# Patient Record
Sex: Female | Born: 1950
Health system: Southern US, Community
[De-identification: ages and names within clinical notes are randomized; demographics above are authoritative.]

## PROBLEM LIST (undated history)

## (undated) DIAGNOSIS — R911 Solitary pulmonary nodule: Secondary | ICD-10-CM

## (undated) DIAGNOSIS — M1990 Unspecified osteoarthritis, unspecified site: Secondary | ICD-10-CM

## (undated) DIAGNOSIS — L719 Rosacea, unspecified: Secondary | ICD-10-CM

## (undated) DIAGNOSIS — R112 Nausea with vomiting, unspecified: Secondary | ICD-10-CM

## (undated) DIAGNOSIS — H509 Unspecified strabismus: Secondary | ICD-10-CM

## (undated) DIAGNOSIS — M722 Plantar fascial fibromatosis: Secondary | ICD-10-CM

## (undated) DIAGNOSIS — E079 Disorder of thyroid, unspecified: Secondary | ICD-10-CM

## (undated) DIAGNOSIS — T8859XA Other complications of anesthesia, initial encounter: Secondary | ICD-10-CM

## (undated) DIAGNOSIS — E039 Hypothyroidism, unspecified: Secondary | ICD-10-CM

## (undated) DIAGNOSIS — Z9889 Other specified postprocedural states: Secondary | ICD-10-CM

## (undated) DIAGNOSIS — R32 Unspecified urinary incontinence: Secondary | ICD-10-CM

## (undated) DIAGNOSIS — K449 Diaphragmatic hernia without obstruction or gangrene: Secondary | ICD-10-CM

## (undated) DIAGNOSIS — T4145XA Adverse effect of unspecified anesthetic, initial encounter: Secondary | ICD-10-CM

## (undated) HISTORY — DX: Diaphragmatic hernia without obstruction or gangrene: K44.9

## (undated) HISTORY — DX: Unspecified strabismus: H50.9

## (undated) HISTORY — PX: KNEE SURGERY: SHX244

## (undated) HISTORY — PX: ESOPHAGEAL DILATION: SHX303

## (undated) HISTORY — DX: Rosacea, unspecified: L71.9

## (undated) HISTORY — DX: Solitary pulmonary nodule: R91.1

## (undated) HISTORY — DX: Unspecified urinary incontinence: R32

## (undated) HISTORY — DX: Disorder of thyroid, unspecified: E07.9

## (undated) HISTORY — DX: Plantar fascial fibromatosis: M72.2

---

## 1972-08-26 HISTORY — PX: TONSILLECTOMY AND ADENOIDECTOMY: SUR1326

## 1993-08-26 HISTORY — PX: BREAST BIOPSY: SHX20

## 1997-11-10 ENCOUNTER — Other Ambulatory Visit: Admission: RE | Admit: 1997-11-10 | Discharge: 1997-11-10 | Payer: Self-pay | Admitting: Obstetrics and Gynecology

## 1999-08-27 HISTORY — PX: OTHER SURGICAL HISTORY: SHX169

## 2000-10-28 ENCOUNTER — Other Ambulatory Visit: Admission: RE | Admit: 2000-10-28 | Discharge: 2000-10-28 | Payer: Self-pay | Admitting: Obstetrics and Gynecology

## 2001-10-28 ENCOUNTER — Other Ambulatory Visit: Admission: RE | Admit: 2001-10-28 | Discharge: 2001-10-28 | Payer: Self-pay | Admitting: Obstetrics and Gynecology

## 2002-02-16 ENCOUNTER — Encounter (INDEPENDENT_AMBULATORY_CARE_PROVIDER_SITE_OTHER): Payer: Self-pay | Admitting: *Deleted

## 2002-02-16 ENCOUNTER — Ambulatory Visit (HOSPITAL_COMMUNITY): Admission: RE | Admit: 2002-02-16 | Discharge: 2002-02-16 | Payer: Self-pay | Admitting: Gastroenterology

## 2002-05-03 ENCOUNTER — Encounter (INDEPENDENT_AMBULATORY_CARE_PROVIDER_SITE_OTHER): Payer: Self-pay

## 2002-05-03 HISTORY — PX: ABDOMINAL HYSTERECTOMY: SHX81

## 2002-05-03 HISTORY — PX: VAGINAL HYSTERECTOMY: SUR661

## 2002-05-04 ENCOUNTER — Inpatient Hospital Stay (HOSPITAL_COMMUNITY): Admission: RE | Admit: 2002-05-04 | Discharge: 2002-05-05 | Payer: Self-pay | Admitting: Obstetrics and Gynecology

## 2005-08-26 HISTORY — PX: CYSTOCELE REPAIR: SHX163

## 2006-03-17 ENCOUNTER — Other Ambulatory Visit: Admission: RE | Admit: 2006-03-17 | Discharge: 2006-03-17 | Payer: Self-pay | Admitting: Obstetrics and Gynecology

## 2006-05-20 ENCOUNTER — Ambulatory Visit (HOSPITAL_COMMUNITY): Admission: RE | Admit: 2006-05-20 | Discharge: 2006-05-21 | Payer: Self-pay | Admitting: Urology

## 2007-08-27 HISTORY — PX: VEIN LIGATION AND STRIPPING: SHX2653

## 2007-11-30 ENCOUNTER — Other Ambulatory Visit: Admission: RE | Admit: 2007-11-30 | Discharge: 2007-11-30 | Payer: Self-pay | Admitting: Obstetrics and Gynecology

## 2008-04-13 ENCOUNTER — Ambulatory Visit: Payer: Self-pay | Admitting: Vascular Surgery

## 2008-07-08 ENCOUNTER — Ambulatory Visit: Payer: Self-pay | Admitting: Vascular Surgery

## 2008-08-26 HISTORY — PX: ENTEROCELE REPAIR: SHX623

## 2008-09-21 ENCOUNTER — Ambulatory Visit: Payer: Self-pay | Admitting: Vascular Surgery

## 2008-09-28 ENCOUNTER — Ambulatory Visit: Payer: Self-pay | Admitting: Vascular Surgery

## 2008-11-11 ENCOUNTER — Ambulatory Visit: Payer: Self-pay | Admitting: Vascular Surgery

## 2008-12-07 ENCOUNTER — Other Ambulatory Visit: Admission: RE | Admit: 2008-12-07 | Discharge: 2008-12-07 | Payer: Self-pay | Admitting: Obstetrics and Gynecology

## 2010-08-26 HISTORY — PX: BACK SURGERY: SHX140

## 2011-01-08 ENCOUNTER — Other Ambulatory Visit: Payer: Self-pay | Admitting: Physical Medicine and Rehabilitation

## 2011-01-08 DIAGNOSIS — M545 Low back pain, unspecified: Secondary | ICD-10-CM

## 2011-01-08 NOTE — Assessment & Plan Note (Signed)
OFFICE VISIT   BURMA, KETCHER  DOB:  Sep 26, 1950                                       07/08/2008  EAVWU#:[id number]   The patient presents today for continued follow-up of her right leg  venous varicosities.  She has been seen by me initially in August.  She  has been very compliant with her thigh-high graduated compression  garments and has had no relief.  She reports these actually make her  legs feel worse with constriction at the top of her leg.  She reports  that she continues to have difficulty with prolonged standing and  prolonged sitting secondary to leg pain and swelling, it affects her  driving habits and also her job requires prolonged sitting which is  negatively impacted by her varicosities.  She also reports difficulty  with housework and yard work secondary to leg pain and swelling.  She  does walk on a treadmill for exercise and has to decrease the frequency  and duration due to her leg pain with varicosities.   PHYSICAL EXAM:  Is unchanged.  She does have marked saphenous tributary  varicosities in her calf.  She underwent a formal duplex evaluation in  our office.  Fortunately, this shows no evidence of deep venous  thrombosis and mild reflux in her right common femoral vein.  Her  saphenous vein is not tortuous or aneurysmal and has reflux throughout  its course.   I have recommended we proceed with laser ablation of her right great  saphenous vein for improvement in her venous hypertension and stab  phlebectomy of her tributary varicosities for relief of pain.  She  understands this and we will proceed with this at her convenience.  She  reports she is quite anxious with office procedures and, therefore, I  have written her a prescription for Ativan 1 mg, dispense one, to take  prior to her procedure.   Larina Earthly, M.D.  Electronically Signed   TFE/MEDQ  D:  07/08/2008  T:  07/11/2008  Job:  [id number]   cc:   Jeralyn Ruths,  MD

## 2011-01-08 NOTE — Letter (Signed)
April 13, 2008   Dr. Jeralyn Ruths   Re:  Kara Mitchell                DOB:  1951/04/26   Dear Claris Che:   Thank you for asking me to see this patient for evaluation of her venous  hypertension in her right leg.  As you know she is a healthy 60 year old  white female with progressive discomfort over her right leg  varicosities.  She reports these have been present for several years and  have become more progressive with pain and a burning sensation over the  varicosities in her medial thigh and medial calf.  She does not have any  history of deep venous thrombosis or other thromboembolic events.  She  does not have any history of bleeding or superficial thrombophlebitis.  She was healthy with no major difficulties, specifically no hypertension  or diabetes.  She does not have any cardiac difficulty.  She has had two  normal pregnancies.  She does not smoke.  Prior surgeries include  tonsillectomy, knee surgery, breast biopsies and hysterectomy.   PHYSICAL EXAMINATION:  A well-developed, well-nourished white female  appearing her stated age of 55.  Blood pressure is 147/74, pulse 82,  respirations 18.  She is grossly intact neurologically.  Her radial and  dorsalis pedis pulses are 2+ bilaterally.  Her lower extremities are  noted for several spider vein telangiectasis over her left anterior  thigh.  On the right leg she does have marked saphenous vein tributary  varicosities over her medial thigh and medial calf.  She underwent  screening duplex by me and this did reveal gross reflux in an enlarged  saphenous vein throughout her thigh.  She had a normal caliber saphenous  vein on the left leg.   I discussed the significance of this with the patient and explained that  her progressive varicosities and discomfort is related to surface vein  venous hypertension.  I explained the initial conservative treatment for  this which would be elevation, ibuprofen 600 mg t.i.d. and  graduated  compression garments.  She is fitted with knee high compression garments  today in our office 20-30 mmHg.  I also discussed the option of laser  ablation and stab phlebectomy for treatment if she fails conservative  treatment.  We will see her again in 3 months to determine if her  compression garments are helping in her discomfort.  I explained the  laser ablation procedure which would be outpatient in our office where  she would walk in, have the laser ablation and stab phlebectomy and walk  out.  I explained the procedure would take approximately 1 1/2 hours.  She was comfortable with this discussion.  We will see her again in 3  months for continued followup.   Sincerely,   Larina Earthly, M.D.  Electronically Signed   TFE/MEDQ  D:  04/13/2008  T:  04/14/2008  Job:  [id number]

## 2011-01-08 NOTE — Assessment & Plan Note (Signed)
OFFICE VISIT   OLIVE, ZMUDA  DOB:  [birth year]                                       09/28/2008  [id number]#:[id number]   The patient presents today 1 week follow-up of laser ablation of her  right great saphenous vein from her knee to her groin and stab  phlebectomy multiple tributaries varicosities in her thigh and calf and  also sclerotherapy of lateral spider vein telangiectasia over her  lateral thigh.  She has done quite well with her initial follow-up.  She  does have the usual amount of bruising and discomfort related to the  ablation..  She underwent repeat venous duplex today and this shows  closure of her saphenous vein and also no evidence of injury of her  common femoral vein or deep system.  I am quite pleased with her initial  result as is the patient.  We will see her again in 6 weeks for final  follow-up.   Larina Earthly, M.D.  Electronically Signed   TFE/MEDQ  D:  09/28/2008  T:  09/29/2008  Job:  [id number]   cc:   Jeralyn Ruths

## 2011-01-08 NOTE — Procedures (Signed)
LOWER EXTREMITY VENOUS REFLUX EXAM   INDICATION:  Right leg varicose vein.   EXAM:  Using color-flow imaging and pulse Doppler spectral analysis, the  right and left common femoral, superficial femoral, popliteal, posterior  tibial, greater and lesser saphenous veins are evaluated.  There is no  evidence suggesting deep venous insufficiency in the right or left lower  extremity.   The right saphenofemoral junction is not competent.  The right GSV is  not competent with the caliber as described below.   The right and left proximal short saphenous vein demonstrates  competency.   GSV Diameter (used if found to be incompetent only)                                            Right    Left  Proximal Greater Saphenous Vein           0.47 cm  cm  Proximal-to-mid-thigh                     0.50 cm  cm  Mid thigh                                 0.47 cm  cm  Mid-distal thigh                          1.2 cm   cm  Distal thigh                              0.6 cm   cm  Knee                                      0.5 cm   cm    IMPRESSION:  1. Right greater saphenous vein reflux is identified with the caliber      ranging from 1.2 cm to 0.4 cm knee to groin.  2. The right and left greater saphenous veins are not aneurysmal.  3. The right and left greater saphenous veins are not tortuous.  4. The deep venous system is competent.  5. The right and left lesser saphenous veins are competent.       ___________________________________________  Larina Earthly, M.D.   MC/MEDQ  D:  07/08/2008  T:  07/08/2008  Job:  [id number]

## 2011-01-08 NOTE — Procedures (Signed)
DUPLEX DEEP VENOUS EXAM - LOWER EXTREMITY   INDICATION:  Follow-up evaluation, status post laser ablation of right  greater saphenous vein.   HISTORY:  Edema:  Right leg, mild.  Trauma/Surgery:  Right greater saphenous vein ablation on 09/21/2008.  Pain:  Right thigh pain.  PE:  No.  Previous DVT:  No.  Anticoagulants:  No.  Other:  No.   DUPLEX EXAM:                CFV   SFV   PopV  PTV    GSV                R  L  R  L  R  L  R   L  R  L  Thrombosis    o  o  o     o     o      +  Spontaneous   +  +  +     +     +      o  Phasic        +  +  +     +     +      o  Augmentation  +  +  +     +     +      o  Compressible  +  +  +     +     +      P  Competent     +  +  +     +     +      o   Legend:  + - yes  o - no  p - partial  D - decreased   IMPRESSION:  1. No evidence of deep venous thrombosis or baker's cyst in the right      leg.  2. Greater saphenous vein is thrombosed from the saphenofemoral      junction to the calf.  3. No evidence of right common femoral vein thrombus.  4. Proximal thigh portion of the greater saphenous vein has a patent      channel with no spontaneous flow.    _____________________________  Larina Earthly, M.D.   MC/MEDQ  D:  09/28/2008  T:  09/28/2008  Job:  [id number]

## 2011-01-08 NOTE — Assessment & Plan Note (Signed)
OFFICE VISIT   Kara Mitchell, Kara Mitchell  DOB:  1951-06-05                                       11/11/2008  [id number]#:[id number]   The patient presents today following right great saphenous vein laser  ablation and stab phlebectomy.  This was on 09/21/2008.  She is doing  quite well.  She has had resolution of all bruising.  She does feel a  slight pulling sensation where the vein has been ablated and I explained  this was normal due to the sclerosis of the vein.  She will continue her  usual activities, is quite pleased with her result and will see [place] again  on an as-needed basis.   Larina Earthly, M.D.  Electronically Signed   TFE/MEDQ  D:  11/11/2008  T:  11/11/2008  Job:  [id number]

## 2011-01-09 ENCOUNTER — Ambulatory Visit
Admission: RE | Admit: 2011-01-09 | Discharge: 2011-01-09 | Disposition: A | Discharge status: Home / Self Care | Payer: BC Managed Care – PPO | Source: Ambulatory Visit | Attending: Physical Medicine and Rehabilitation | Admitting: Physical Medicine and Rehabilitation

## 2011-01-09 DIAGNOSIS — M545 Low back pain, unspecified: Secondary | ICD-10-CM

## 2011-01-11 NOTE — Discharge Summary (Signed)
   Kara Mitchell, Kara Mitchell                          ACCOUNT NO.:  [id number]   MEDICAL RECORD NO.:  [id number]                   PATIENT TYPE:  INP   LOCATION:  9307                                 FACILITY:  [facility]   PHYSICIAN:  Cynthia P. Romine, M.D.             DATE OF BIRTH:  [birth year]   DATE OF ADMISSION:  05/03/2002  DATE OF DISCHARGE:  05/05/2002                                 DISCHARGE SUMMARY   DISCHARGE DIAGNOSIS:  Uterine prolapse, cystocele, vulvar lesion.   PROCEDURE:  Total vaginal hysterectomy, anterior repair.   HOSPITAL COURSE:  This is a 60 year old married white female with complaints  of uterine prolapse and cystocele, and a vulvar lesion being admitted for  surgical repair.  On May 03, 2002 she underwent a total vaginal  hysterectomy, anterior repair, and vulvar biopsy without difficulty.  Estimated blood loss was 200 cc.  Postoperatively she did very well and had  an unremarkable postoperative course, and was stable for discharge by  postoperative day #2 on May 05, 2002.   Pathology showed adenomyosis and endosalpingosis in the uterus.  In the  vulvar biopsy was a vulvar melanocytic macule.   LABORATORY DATA:  Admission H&H was 14 and 40; on discharge 11.4 and 33.0.                                               Cynthia P. Romine, M.D.    CPR/MEDQ  D:  07/06/2002  T:  07/06/2002  Job:  [id number]

## 2011-01-11 NOTE — Op Note (Signed)
Kara Mitchell, Kara Mitchell                          ACCOUNT NO.:  [id number]   MEDICAL RECORD NO.:  [id number]                   PATIENT TYPE:  OBV   LOCATION:  9307                                 FACILITY:  [facility]   PHYSICIAN:  Cynthia P. Ashley Royalty, M.D.           DATE OF BIRTH:  1951-08-12   DATE OF PROCEDURE:  05/03/2002  DATE OF DISCHARGE:                                 OPERATIVE REPORT   PREOPERATIVE DIAGNOSES:  1. Uterine prolapse.  2. Cystocele.   POSTOPERATIVE DIAGNOSES:  1. Uterine prolapse.  2. Cystocele.  3. Pathology pending.   PROCEDURE:  Total vaginal hysterectomy, anterior repair.   SURGEON:  Cynthia P. Ashley Royalty, M.D.   ASSISTANT:  Andres Ege, M.D.   ANESTHESIA:  General endotracheal.   ESTIMATED BLOOD LOSS:  200 cc.   COMPLICATIONS:  None.   DESCRIPTION OF PROCEDURE:  The patient was taken to the operating room and  after the induction of adequate general endotracheal anesthesia was placed  in the dorsal lithotomy position, prepped and draped in the usual fashion.  The bladder was drained with a red rubber catheter.  The cervix was grasped  on its anterior lip with a single-tooth tenaculum.  1% Xylocaine with  epinephrine was used to infiltrate submucosally.  The mucosa was incised  with the knife.  The bladder was dissected off the cervix using sharp and  blunt dissection.  The patient was then turned posteriorly.  A posterior  colpotomy incision was made and the Bonnano retractor was placed through the  posterior cul-de-sac.  The uterosacral ligaments were clamped, cut and tied  on each side.  The patient was next turned back anteriorly.  The peritoneum  was entered with the Metzenbaum scissors and a Deaver retractor was placed  into the peritoneum.  The uterine arteries were clamped, cut and doubly tied  on both sides.  The fundus was delivered to the cul-de-sac, and the pedicle  containing the tube, the ovarian and the round ligament were clamped,  cut  and doubly tied on each side.  Specimen was sent to pathology.  The pedicles  were inspected and were hemostatic.  The ovaries appeared normal.  Both  ovaries were elongated and went up along the pelvic side wall.  On the left  ovary, there was a small adhesion between the apex of the ovary and the  pelvic sidewall.  It was not felt that these ovaries were amenable for  vaginal BSO.  Therefore, they were left in situ.  The posterior vaginal cuff  was then run and locked for hemostasis and the anti-enterocele stitch was  placed bringing the uterosacral ligament segments together in the midline,  and attention was then turned to the bladder.  The vaginal mucosa was  divided up to approximately 1 cm below the urethral meatus.  The mucosa was  separated from the underlying fascial tissue and the bladder was imbricated  with 2-0 Vicryl.  The vaginal mucosa was then trimmed and then closed in a  running fashion using 2-0 chromic.  Uterosacral ligaments were then tied  together in the midline.  The vaginal cuff was  closed with interrupted sutures of figure-of-eight 0 chromic.  The cuff was  irrigated.  The Foley catheter was placed.  The vagina was packed with 1  inch plain gauze with Estrace cream in it, and the procedure was terminated.  The patient tolerated it and went in satisfactory condition to post  anesthesia recovery.        Cynthia P. Romine, M.D.                   Cynthia P. Ashley Royalty, M.D.    CPR/MEDQ  D:  05/03/2002  T:  05/03/2002  Job:  [id number]

## 2011-01-11 NOTE — Op Note (Signed)
Kara Mitchell, Kara Mitchell                ACCOUNT NO.:  [id number]   MEDICAL RECORD NO.:  [id number]          PATIENT TYPE:  AMB   LOCATION:  DAY                          FACILITY:  [facility]   PHYSICIAN:  Jamison Neighbor, M.D.  DATE OF BIRTH:  1951/08/11   DATE OF PROCEDURE:  05/20/2006  DATE OF DISCHARGE:                                 OPERATIVE REPORT   PREOPERATIVE DIAGNOSIS:  Cystocele.   POSTOPERATIVE DIAGNOSIS:  Cystocele.   PROCEDURE:  1. Cystocele repair with Perigee graft.  2. Cystoscopy.   SURGEON:  Jamison Neighbor, M.D.   ASSISTANT:  Cornelious Bryant, M.D.   ANESTHESIA:  General.   SPECIMENS:  None.   ESTIMATED BLOOD LOSS:  50   COMPLICATIONS:  None.   INDICATIONS:  This is a 60 year old lady who presented to Dr. Logan Bores with  bladder prolapse.  The patient has been complaining from her cystocele.  Urodynamics were done and did not show stress incontinence.  After extensive  counseling, the patient elected for cystocele repair.   DESCRIPTION OF PROCEDURE:  The patient was brought to the operating room.  Preoperative antibiotics were given.  General anesthesia was induced.  The  patient was placed in the dorsal lithotomy position.  All the pressure  points were padded and care was taken to avoid compartment syndrome,  neuropathy and DVT.  The patient was prepped and draped in normal sterile  fashion.  A timeout was taken to properly identify the patient and the  procedure going to be done.  A weighted vaginal speculum was then inserted.  It did reveal a large bladder prolapse that approached the introitus.  Lidocaine with epinephrine was then injected in the midline of the anterior  vaginal wall from the vesicouterine junction up to the apex of the anterior  vaginal wall.  This was followed by opening the incision with a knife.  Spreaders were then used to develop the space between the vaginal mucosa and  the bladder wall up to, but not through the endopelvic fascia.   Hemostasis  was obtained throughout the procedure using electrocautery.  Using 2-0  Vicryl, Kelly plication of the bladder wall was then made and the cystocele  was then reduced.  Following plication, a Perigee graft was then applied as  follows:  At the level of the clitoris horizontally, stab wounds were made  in the crease at the junction of the medial thigh and the perineum  bilaterally.  Beginning 5 cm posterior to those incisions, the other stab  wounds were made again in the crease between the medial thigh and the  perineum.  The Perigee system needles were then applied through those stab  incisions and the needles were then torqued around the pubic bone and were  then guided with a finger that was placed through the vaginal wound.  Following the placement of the needles, cystoscopy was performed.  There was  no evidence of urethral or bladder violation or trauma by the needles.  The  cystocele repair was noted on cystoscopy as the posterior base of bladder  hump.  Both ureteral  orifices were identified and were effluxing urine  normally.  Following the removal of the cystoscope and the insertion of the  Foley catheter, the graft was then inserted.  The excess lip of the apical  aspect of the graft was then trimmed.  The graft was snugged in place over  the repair.  The needles were then cut and the plastic sheets removed.  The  graft arm mesh was then cut over the stab wounds created in the perineum.  The vaginal mucosa was then approximated using 2-0 Vicryl in a running  fashion.  Repeat cystoscopy at the end of the procedure did not reveal any  blood or trauma and did not reveal an high-riding vesicouterine junction.  The Foley catheter was then inserted and drainage was clear.  It was  connected to a drainage bag.  The perineal stab wounds were then closed with  Dermabond.  A vaginal pack soaked in antibiotic solution was then inserted.  The patient was then extubated in the  operating room and taken in stable  condition to PACU.  Please note that Dr. Logan Bores was present and participated  in the entire procedure, as he was the responsible surgeon.     ______________________________  Cornelious Bryant, MD      Jamison Neighbor, M.D.  Electronically Signed    SK/MEDQ  D:  05/20/2006  T:  05/22/2006  Job:  [id number]

## 2011-12-11 ENCOUNTER — Other Ambulatory Visit: Payer: Self-pay | Admitting: Dermatology

## 2012-08-31 DIAGNOSIS — H02409 Unspecified ptosis of unspecified eyelid: Secondary | ICD-10-CM | POA: Insufficient documentation

## 2012-10-24 HISTORY — PX: BLEPHAROPLASTY: SUR158

## 2013-01-05 ENCOUNTER — Encounter: Payer: Self-pay | Admitting: Obstetrics and Gynecology

## 2013-01-08 ENCOUNTER — Ambulatory Visit (INDEPENDENT_AMBULATORY_CARE_PROVIDER_SITE_OTHER): Payer: BC Managed Care – PPO | Admitting: Obstetrics and Gynecology

## 2013-01-08 ENCOUNTER — Encounter: Payer: Self-pay | Admitting: Obstetrics and Gynecology

## 2013-01-08 VITALS — BP 122/70 | Ht 64.0 in | Wt 172.0 lb

## 2013-01-08 DIAGNOSIS — Z01419 Encounter for gynecological examination (general) (routine) without abnormal findings: Secondary | ICD-10-CM

## 2013-01-08 DIAGNOSIS — Z0000 Encounter for general adult medical examination without abnormal findings: Secondary | ICD-10-CM

## 2013-01-08 LAB — POCT URINALYSIS DIPSTICK
Blood, UA: NEGATIVE
Ketones, UA: NEGATIVE
Protein, UA: NEGATIVE
pH, UA: 7

## 2013-01-08 MED ORDER — VITAMIN D (ERGOCALCIFEROL) 1.25 MG (50000 UNIT) PO CAPS: 50000.0000 [IU] | ORAL_CAPSULE | ORAL | Status: DC

## 2013-01-08 MED ORDER — SOLIFENACIN SUCCINATE 5 MG PO TABS: 5.0000 mg | ORAL_TABLET | Freq: Every day | ORAL | Status: DC

## 2013-01-08 NOTE — Patient Instructions (Signed)

## 2013-01-08 NOTE — Progress Notes (Signed)
62 y.o.   Divorced    Caucasian   female   G2P2003   here for annual exam.  Just added a screened in porch at the back of her house and loves it.  Using vesicare but only using estrogen cream once a month.  Just had a blepharoplasty 9 weeks ago so can't exercise again yet.  Still sees Dr. Evlyn Kanner for primary care.  Saw Dr. Nicholas Lose for skin check and all ok.    No LMP recorded. Patient has had a hysterectomy.          Sexually active: no  The current method of family planning is status post hysterectomy.    Exercising: can not exercise, occ walking Last mammogram:  05/25/12 neg Last pap smear:4/14 10 neg History of abnormal pap: no Smoking:quit 20 years ago Alcohol:1-2 times a year a glass of wine Last colonoscopy:01/2010 normal repeat in 5 years Last Bone Density: 04/2009 normal  Last tetanus shot:less than 10 years Last cholesterol check: 07/2012 normal  Hgb:13.7                Urine:neg   Family History  Problem Relation Age of Onset  . Adopted: Yes    There are no active problems to display for this patient.   Past Medical History  Diagnosis Date  . Thyroid disease   . Hiatal hernia   . Urinary incontinence   . Rosacea   . Strabismus     Past Surgical History  Procedure Laterality Date  . Cystocele repair  2007  . Enterocele repair  2010  . Tonsillectomy and adenoidectomy  1974  . Knee surgery      x2  . Breast biopsy  1995  . Other surgical history  2001    HNP L4-5  . Esophageal dilation  2004;2007  . Other surgical history      HNP repair- Delma Officer  . Abdominal hysterectomy    . Back surgery  2012  . Blepharoplasty  10/2012  . Cosmetic surgery    . Vein ligation and stripping  2009  . Breast biopsy  1/95    Allergies: Fentanyl; Neosporin; Polysporin; Tetanus toxoids; and Versed  Current Outpatient Prescriptions  Medication Sig Dispense Refill  . esomeprazole (NEXIUM) 40 MG packet Take 40 mg by mouth daily before breakfast.      . Estradiol (ESTRACE VA)  Place vaginally every 30 (thirty) days.       . metroNIDAZOLE (METROGEL) 1 % gel Apply topically daily.      . Omega-3 Fatty Acids (FISH OIL PO) Take by mouth daily.       . solifenacin (VESICARE) 5 MG tablet Take 10 mg by mouth daily.      Marland Kitchen thyroid (ARMOUR THYROID) 30 MG tablet Take 30 mg by mouth daily.      . Vitamin D, Ergocalciferol, (DRISDOL) 50000 UNITS CAPS Take 50,000 Units by mouth once a week.      . Cholecalciferol (VITAMIN D PO) Take by mouth. Procite D       No current facility-administered medications for this visit.    ROS: Pertinent items are noted in HPI.  Social Hx: divorced, three children (one set of twins) works doing Audiological scientist   Exam:    BP 122/70  Ht 5\' 4"  (1.626 m)  Wt 172 lb (78.019 kg)  BMI 29.51 kg/m2   Wt Readings from Last 3 Encounters:  01/08/13 172 lb (78.019 kg)     Ht Readings from Last 3  Encounters:  01/08/13 5\' 4"  (1.626 m)    General appearance: alert, cooperative and appears stated age Head: Normocephalic, without obvious abnormality, atraumatic Neck: no adenopathy, supple, symmetrical, trachea midline and thyroid not enlarged, symmetric, no tenderness/mass/nodules Lungs: clear to auscultation bilaterally Breasts: Inspection negative, No nipple retraction or dimpling, No nipple discharge or bleeding, No axillary or supraclavicular adenopathy, Normal to palpation without dominant masses Heart: regular rate and rhythm Abdomen: soft, non-tender; bowel sounds normal; no masses,  no organomegaly Extremities: extremities normal, atraumatic, no cyanosis or edema Skin: Skin color, texture, turgor normal. No rashes or lesions Lymph nodes: Cervical, supraclavicular, and axillary nodes normal. No abnormal inguinal nodes palpated Neurologic: Grossly normal   Pelvic: External genitalia:  no lesions              Urethra:  normal appearing urethra with no masses, tenderness or lesions              Bartholins and Skenes: normal                  Vagina: normal appearing vagina with normal color and discharge, no lesions              Cervix: absent              Pap taken: no        Bimanual Exam:  Uterus:  absent                                      Adnexa: normal adnexa in size, nontender and no masses                                      Rectovaginal: Confirms                                      Anus:  normal sphincter tone, no lesions  A: normal menopausal exam, no HRT     S/p TVH, ant repair, later mesh cystocele repair     Using #4 ring pessary rarely, uses vesicare still      S/p HNP repair L4-29 January 2011     P: mammogram counseled on breast self exam, mammography screening, adequate intake of calcium and vitamin D, diet and exercise return annually or prn  Can stop the estrogen cream because she was using it with her pessary and she hardly ever uses the pessary anymore. RF vesicare and Vit d weekly    An After Visit Summary was printed and given to the patient.

## 2013-02-06 ENCOUNTER — Other Ambulatory Visit: Payer: Self-pay | Admitting: Obstetrics and Gynecology

## 2013-02-09 ENCOUNTER — Other Ambulatory Visit: Payer: Self-pay | Admitting: *Deleted

## 2013-02-23 ENCOUNTER — Telehealth: Payer: Self-pay | Admitting: Obstetrics and Gynecology

## 2013-02-23 NOTE — Telephone Encounter (Signed)
Please advise 

## 2013-02-23 NOTE — Telephone Encounter (Signed)
Call to patient and left message that appt has been moved to 01-17-13 with dr Hyacinth Meeker at 1245 per her request and please call back to let me know if this is acceptable.  VM confirms phone number.

## 2013-02-23 NOTE — Telephone Encounter (Signed)
Pt would like to talk to dr romine if possible regarding switching her appt from dr lathrop next year to dr Hyacinth Meeker. Pt does not want to keep appt already scheduled because she does not know dr lathrop.

## 2013-02-24 ENCOUNTER — Telehealth: Payer: Self-pay | Admitting: *Deleted

## 2013-02-24 NOTE — Telephone Encounter (Signed)
Patient returns my call and confirms she is agreeable with May 25,2015 appt with Dr Hyacinth Meeker.

## 2013-06-01 ENCOUNTER — Other Ambulatory Visit: Payer: Self-pay | Admitting: Surgery

## 2013-06-01 ENCOUNTER — Ambulatory Visit
Admission: RE | Admit: 2013-06-01 | Discharge: 2013-06-01 | Disposition: A | Discharge status: Home / Self Care | Payer: BC Managed Care – PPO | Source: Ambulatory Visit | Attending: Surgery | Admitting: Surgery

## 2013-06-01 DIAGNOSIS — Z87891 Personal history of nicotine dependence: Secondary | ICD-10-CM

## 2013-06-01 DIAGNOSIS — R05 Cough: Secondary | ICD-10-CM

## 2013-06-01 DIAGNOSIS — R058 Other specified cough: Secondary | ICD-10-CM

## 2013-06-03 ENCOUNTER — Other Ambulatory Visit: Payer: Self-pay | Admitting: Surgery

## 2013-06-03 ENCOUNTER — Ambulatory Visit
Admission: RE | Admit: 2013-06-03 | Discharge: 2013-06-03 | Disposition: A | Discharge status: Home / Self Care | Payer: BC Managed Care – PPO | Source: Ambulatory Visit | Attending: Surgery | Admitting: Surgery

## 2013-06-03 DIAGNOSIS — R9389 Abnormal findings on diagnostic imaging of other specified body structures: Secondary | ICD-10-CM

## 2013-06-03 MED ORDER — IOHEXOL 300 MG/ML  SOLN
75.0000 mL | Freq: Once | INTRAMUSCULAR | Status: AC | PRN
  Administered 2013-06-03: 75 mL via INTRAVENOUS

## 2014-01-04 ENCOUNTER — Other Ambulatory Visit: Payer: Self-pay | Admitting: *Deleted

## 2014-01-04 ENCOUNTER — Encounter: Payer: Self-pay | Admitting: Thoracic Surgery (Cardiothoracic Vascular Surgery)

## 2014-01-04 ENCOUNTER — Encounter (INDEPENDENT_AMBULATORY_CARE_PROVIDER_SITE_OTHER): Payer: Self-pay

## 2014-01-04 ENCOUNTER — Institutional Professional Consult (permissible substitution) (INDEPENDENT_AMBULATORY_CARE_PROVIDER_SITE_OTHER): Payer: BC Managed Care – PPO | Admitting: Thoracic Surgery (Cardiothoracic Vascular Surgery)

## 2014-01-04 VITALS — BP 149/93 | HR 96 | Resp 20 | Ht 64.0 in | Wt 178.0 lb

## 2014-01-04 DIAGNOSIS — R911 Solitary pulmonary nodule: Secondary | ICD-10-CM

## 2014-01-04 NOTE — Progress Notes (Signed)
PCP is Sheela Stack, MD Referring Provider is [place], Herminio Commons, MD  Chief Complaint  Patient presents with  . Lung Lesion    Surgical eval on left upper lobe lung nodule, Chest CT 06/03/2014    HPI: 63 year old woman with a remote history of tobacco abuse who was found last fall have a small left lower lobe nodule.  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 She has a past history of tobacco abuse, starting about age 86 and continuing until she was 42. She smoked as much as one half packs per day (37.5 pack years). She quit in 1994. She has no known family history as she was adopted. Last fall she was complaining of shortness of breath. She had a chest x-ray done as part of that workup. It showed a possible left upper lobe nodule. This was followed by a CT of the chest. It showed there was not a nodule in the left upper lobe. A very small 2 1/2- 3 mm nodule was seen in the superior segment of the left lower lobe. A repeat CT for one year was recommended.  She initially was happy with waiting a year for a CT scan. However over time should become more more concerned that this could be a lung cancer is very anxious about it.  She does complain of shortness of breath with exertion although she can walk up a flight of stairs. She also complains of decreased energy. She's not had any chest pain, pressure, or tightness. Her weight has been stable without any weight loss over the past 6 months. She works full-time as an Optometrist. She is by herself and takes care of all her household needs. She's not had any unusual cough or wheezing.   Past Medical History  Diagnosis Date  . Thyroid disease   . Hiatal hernia   . Urinary incontinence   . Rosacea   . Strabismus     Past Surgical History  Procedure Laterality Date  . Cystocele repair  2007  . Enterocele repair  2010  . Tonsillectomy and adenoidectomy  1974  . Knee surgery      x2  . Breast biopsy  1995  . Other surgical history  2001     HNP L4-5  . Esophageal dilation  2004;2007  . Other surgical history      HNP repair- Earle Gell  . Abdominal hysterectomy    . Back surgery  2012  . Blepharoplasty  10/2012  . Cosmetic surgery    . Vein ligation and stripping  2009  . Breast biopsy  1/95    Family History  Problem Relation Age of Onset  . Adopted: Yes  . Cancer       NO FAMILY HX    Social History History  Substance Use Topics  . Smoking status: Former Smoker -- 1.50 packs/day for 25 years    Types: Cigarettes    Quit date: 01/08/1993  . Smokeless tobacco: Never Used  . Alcohol Use: 0.5 oz/week    1 drink(s) per week     Comment: 1-2 times a year a glass of wine    Current Outpatient Prescriptions  Medication Sig Dispense Refill  . pantoprazole (PROTONIX) 40 MG tablet Take 40 mg by mouth daily.      . solifenacin (VESICARE) 5 MG tablet Take 1 tablet (5 mg total) by mouth daily.  30 tablet  12  . thyroid (ARMOUR THYROID) 30 MG tablet Take 30 mg by mouth daily.      Marland Kitchen  Vitamin D, Ergocalciferol, (DRISDOL) 50000 UNITS CAPS Take 1 capsule (50,000 Units total) by mouth once a week.  26 capsule  1   No current facility-administered medications for this visit.    Allergies  Allergen Reactions  . Fentanyl Other (See Comments)    Low blood pressure  . Neosporin [Neomycin-Bacitracin Zn-Polymyx]   . Polysporin [Bacitracin-Polymyxin B]   . Tetanus Toxoids Other (See Comments)    fainting  . Versed [Midazolam] Other (See Comments)    Low blood pressure    Review of Systems  Constitutional: Positive for fatigue (decreased energy).  Respiratory: Positive for shortness of breath (> 1 flight of stairs).   Gastrointestinal:       Hiatal hernia, reflux- controlled with meds  Genitourinary: Positive for frequency.  Musculoskeletal:       Leg cramps    BP 149/93  Pulse 96  Resp 20  Ht 5\' 4"  (1.626 m)  Wt 178 lb (80.74 kg)  BMI 30.54 kg/m2  SpO2 98% Physical Exam  Vitals  reviewed. Constitutional: She is oriented to person, place, and time. She appears well-developed and well-nourished. No distress.  HENT:  Head: Normocephalic and atraumatic.  Eyes: EOM are normal. Pupils are equal, round, and reactive to light.  Neck: Neck supple. No thyromegaly present.  Cardiovascular: Normal rate, regular rhythm, normal heart sounds and intact distal pulses.   No murmur heard. Pulmonary/Chest: Effort normal and breath sounds normal. She has no wheezes.  Abdominal: Soft. There is no tenderness.  Musculoskeletal: She exhibits no edema.  Lymphadenopathy:    She has no cervical adenopathy.  Neurological: She is alert and oriented to person, place, and time. No cranial nerve deficit.  Skin: Skin is warm and dry.     Diagnostic Tests: CT chest 06/03/2013 CT CHEST WITH CONTRAST  TECHNIQUE:  Multidetector CT imaging of the chest was performed during  intravenous contrast administration.  CONTRAST: 70mL OMNIPAQUE IOHEXOL 300 MG/ML SOLN  COMPARISON: 06/01/2013.  FINDINGS:  There is no axillary adenopathy. No pericardial or pleural effusion.  Aorta and branch vessels are within normal limits. The heart grossly  appears normal. There is a tiny hiatal hernia. Incidental imaging of  the upper abdomen is within normal limits. There is a 3 mm pulmonary  nodule in the periphery of the superior segment of the left lower  lobe (image number 23 series 3). The central airways are patent.  There is no adenopathy. No aggressive osseous lesions. Vertebral  body height is normal.  IMPRESSION:  1. 3 mm peripheral pulmonary nodule in the superior segment of the  left lower lobe. If the patient is at high risk for bronchogenic  carcinoma, follow-up chest CT at 1year is recommended. If the  patient is at low risk, no follow-up is needed. This recommendation  follows the consensus statement: Guidelines for Management of Small  Pulmonary Nodules Detected on CT Scans: A Statement from  the  [employer] as published in Radiology 2005; 237:395-400.  2. The nodule on the prior chest radiograph was artifactual,  possibly due to costochondral calcification. There is no lingular  pulmonary nodule.  Electronically Signed  By: Dereck Ligas M.D.  On: 06/03/2013 16:08   Impression: 63 year old woman with a 37.5-pack-year history of smoking who has a small subpleural nodule in the superior segment of her left lower lobe by CT scan. This nodule is about 2.5-3 mm in diameter. I had a long discussion with Kara Mitchell and reviewed the CT scan with her. I discussed with  her the difficulties that would be involved trying to biopsy this lesion by any available method.  We had a long discussion regarding the finding of the small pulmonary nodules on CT scans. The vast majority of these turn out to be benign and not cancerous. There is no indication for any aggressive intervention such as biopsy or surgery at this time. In fact, at present the risk of surgery would outweigh the risk of this turning out to be a lung cancer. However the nodule does need to be followed to ensure that it does not progress.   I am having to make these recommendations based on CT scan is about 3 months old. Given her anxiety, I do think it would be indicated to go ahead and do another scan at this time to make sure there is been no progression of the nodule. If that does not show signs of growth I think he can be safely followed at yearly intervals after that.  She does meet criteria for low dose lung cancer screening.  Plan:  CT chest.  Return in one week to discuss results.

## 2014-01-05 ENCOUNTER — Other Ambulatory Visit: Payer: Self-pay | Admitting: *Deleted

## 2014-01-05 DIAGNOSIS — R911 Solitary pulmonary nodule: Secondary | ICD-10-CM

## 2014-01-05 LAB — CREATININE, SERUM: CREATININE: 0.7 mg/dL (ref 0.50–1.10)

## 2014-01-05 LAB — BUN: BUN: 9 mg/dL (ref 6–23)

## 2014-01-06 ENCOUNTER — Ambulatory Visit
Admission: RE | Admit: 2014-01-06 | Discharge: 2014-01-06 | Disposition: A | Discharge status: Home / Self Care | Payer: BC Managed Care – PPO | Source: Ambulatory Visit | Attending: Thoracic Surgery (Cardiothoracic Vascular Surgery) | Admitting: Thoracic Surgery (Cardiothoracic Vascular Surgery)

## 2014-01-06 DIAGNOSIS — R911 Solitary pulmonary nodule: Secondary | ICD-10-CM

## 2014-01-06 MED ORDER — IOHEXOL 300 MG/ML  SOLN
75.0000 mL | Freq: Once | INTRAMUSCULAR | Status: AC | PRN
  Administered 2014-01-06: 75 mL via INTRAVENOUS

## 2014-01-10 ENCOUNTER — Encounter: Payer: Self-pay | Admitting: Obstetrics & Gynecology

## 2014-01-10 ENCOUNTER — Ambulatory Visit (INDEPENDENT_AMBULATORY_CARE_PROVIDER_SITE_OTHER): Payer: BC Managed Care – PPO | Admitting: Obstetrics & Gynecology

## 2014-01-10 VITALS — BP 124/68 | HR 68 | Resp 16 | Ht 64.25 in | Wt 180.8 lb

## 2014-01-10 DIAGNOSIS — Z124 Encounter for screening for malignant neoplasm of cervix: Secondary | ICD-10-CM

## 2014-01-10 DIAGNOSIS — Z01419 Encounter for gynecological examination (general) (routine) without abnormal findings: Secondary | ICD-10-CM

## 2014-01-10 DIAGNOSIS — Z0000 Encounter for general adult medical examination without abnormal findings: Secondary | ICD-10-CM

## 2014-01-10 LAB — POCT URINALYSIS DIPSTICK
BILIRUBIN UA: NEGATIVE
GLUCOSE UA: NEGATIVE
KETONES UA: NEGATIVE
Leukocytes, UA: NEGATIVE
Nitrite, UA: NEGATIVE
PH UA: 5
Protein, UA: NEGATIVE
Urobilinogen, UA: NEGATIVE

## 2014-01-10 NOTE — Patient Instructions (Signed)

## 2014-01-10 NOTE — Progress Notes (Signed)
63 y.o. [id number] DivorcedCaucasianF here for annual exam.  No vaginal bleeding.  CT being done for pulmonary nodule, 31mm.  Had CT 01/06/14.  Reviewed with pt personally.  Remote hx of smoking.  No vaginal bleeding.  D/W pt guidelines for Pap smear.  Patient would like a Pap smear today.  She understands guidelines.    Patient's last menstrual period was 08/26/2001.          Sexually active: no  The current method of family planning is status post hysterectomy.    Exercising: no  not regularly Smoker:  Former smoker  Health Maintenance: Pap:  12/07/08 WNL History of abnormal Pap:  yes MMG:  06/01/13 3D-normal Colonoscopy:  2013-repeat in 3 years, Dr. Earlean Shawl BMD:   05/17/09-normal TDaP:  9/10 Screening Labs: PCP, Hb today: PCP, Urine today: RBC-trace   reports that she quit smoking about 21 years ago. Her smoking use included Cigarettes. She has a 37.5 pack-year smoking history. She has never used smokeless tobacco. She reports that she drinks alcohol. She reports that she does not use illicit drugs.  Past Medical History  Diagnosis Date  . Thyroid disease   . Hiatal hernia   . Urinary incontinence   . Rosacea   . Strabismus   . Lung nodule     right, lower-seen on CT, had second CT 01/06/14-followed by Dr Roxan Hockey    Past Surgical History  Procedure Laterality Date  . Cystocele repair  2007  . Enterocele repair  2010  . Tonsillectomy and adenoidectomy  1974  . Knee surgery      x2  . Breast biopsy  1995  . Other surgical history  2001    HNP L4-5  . Esophageal dilation  2004;2007  . Other surgical history      HNP repair- Earle Gell  . Abdominal hysterectomy    . Back surgery  2012  . Blepharoplasty  10/2012  . Cosmetic surgery    . Vein ligation and stripping  2009  . Breast biopsy  1/95    Current Outpatient Prescriptions  Medication Sig Dispense Refill  . pantoprazole (PROTONIX) 40 MG tablet Take 40 mg by mouth daily.      . solifenacin (VESICARE) 5 MG tablet  Take 1 tablet (5 mg total) by mouth daily.  30 tablet  12  . thyroid (ARMOUR THYROID) 30 MG tablet Take 30 mg by mouth daily.      . Vitamin D, Ergocalciferol, (DRISDOL) 50000 UNITS CAPS Take 1 capsule (50,000 Units total) by mouth once a week.  26 capsule  1  . estradiol (ESTRACE) 0.1 MG/GM vaginal cream Place 1 Applicatorful vaginally.       No current facility-administered medications for this visit.    Family History  Problem Relation Age of Onset  . Adopted: Yes  . Cancer       NO FAMILY HX    ROS:  Pertinent items are noted in HPI.  Otherwise, a comprehensive ROS was negative.  Exam:   BP 124/68  Pulse 68  Resp 16  Ht 5' 4.25" (1.632 m)  Wt 180 lb 12.8 oz (82.01 kg)  BMI 30.79 kg/m2  LMP 08/26/2001     Height: 5' 4.25" (163.2 cm)  Ht Readings from Last 3 Encounters:  01/10/14 5' 4.25" (1.632 m)  01/04/14 5\' 4"  (1.626 m)  01/08/13 5\' 4"  (1.626 m)    General appearance: alert, cooperative and appears stated age Head: Normocephalic, without obvious abnormality, atraumatic Neck: no adenopathy, supple,  symmetrical, trachea midline and thyroid normal to inspection and palpation Lungs: clear to auscultation bilaterally Breasts: normal appearance, no masses or tenderness Heart: regular rate and rhythm Abdomen: soft, non-tender; bowel sounds normal; no masses,  no organomegaly Extremities: extremities normal, atraumatic, no cyanosis or edema Skin: Skin color, texture, turgor normal. No rashes or lesions Lymph nodes: Cervical, supraclavicular, and axillary nodes normal. No abnormal inguinal nodes palpated Neurologic: Grossly normal   Pelvic: External genitalia:  no lesions              Urethra:  normal appearing urethra with no masses, tenderness or lesions              Bartholins and Skenes: normal                 Vagina: normal appearing vagina with normal color and discharge, no lesions              Cervix: absent              Pap taken: yes Bimanual Exam:  Uterus:   uterus absent              Adnexa: normal adnexa and no mass, fullness, tenderness               Rectovaginal: Confirms               Anus:  normal sphincter tone, no lesions  A:  Well Woman with normal exam S/p TVH, anterior repair 3/09, then cystocele repiar with mesh with Dr. Lawrence Santiago 207. Uses #4 ring pessary only occasionally Grade 3 rectocele  P:   Mammogram yearly. Doing 3D. Add BMD to MMG pap smear obtained today.  Pt aware of guidelines. Labs with Dr. Forde Dandy return annually or prn  An After Visit Summary was printed and given to the patient.

## 2014-01-11 ENCOUNTER — Encounter: Payer: Self-pay | Admitting: Thoracic Surgery (Cardiothoracic Vascular Surgery)

## 2014-01-11 ENCOUNTER — Ambulatory Visit (INDEPENDENT_AMBULATORY_CARE_PROVIDER_SITE_OTHER): Payer: BC Managed Care – PPO | Admitting: Thoracic Surgery (Cardiothoracic Vascular Surgery)

## 2014-01-11 VITALS — BP 131/84 | HR 64 | Resp 16 | Ht 64.0 in | Wt 178.0 lb

## 2014-01-11 DIAGNOSIS — R911 Solitary pulmonary nodule: Secondary | ICD-10-CM

## 2014-01-11 DIAGNOSIS — D381 Neoplasm of uncertain behavior of trachea, bronchus and lung: Secondary | ICD-10-CM

## 2014-01-11 NOTE — Progress Notes (Signed)
Patient ID: Kara Mitchell, female   DOB: 11/07/1950, 63 y.o.   MRN: [id number]   Kara Mitchell returns today to discuss the results of Kara Mitchell chest CT.  She is a 63 year old former smoker (37.5 pack years) who quit in 1994. She had a CT back in the fall which showed a 2.6 mm subpleural nodule in the superior segment of the left lower lobe. She had become more more concern that this could be a lung cancer and requested to see me. We had a long discussion last week and I recommended that we go ahead and do another CT since it had been 6 months.  CT CHEST WITH CONTRAST  TECHNIQUE:  Multidetector CT imaging of the chest was performed during  intravenous contrast administration.  CONTRAST: 80mL OMNIPAQUE IOHEXOL 300 MG/ML SOLN  COMPARISON: 06/03/2013  FINDINGS:  Small hiatal hernia.  2 mm peripheral pulmonary nodule in the superior segment left lower  lobe, no change. No new nodule. The lungs appear otherwise clear.  Thoracic spondylosis. No thoracic adenopathy.  IMPRESSION:  1. The 2 mm subpleural nodule in the superior segment left lower  lobe is essentially unchanged and almost imperceptible. Current  guidelines call for a 1 year follow-up from initial discovery in  order to ensure lack of progression in high risk patients. We have  currently demonstrated 7 months of stability. Accordingly I  recommend a 6 month followup CT chest if the patient is at high risk  for lung cancer. If the patient is not at high risk for lung cancer,  no further imaging workup is required.  2. Small hiatal hernia.  Electronically Signed  By: Sherryl Barters M.D.  On: 01/06/2014 15:17   We reviewed the CT scan side-by-side. There is been no interval growth of the nodule. Although it measured slightly smaller I think this is within the range of error. In my opinion it is unchanged.  We will follow the radiologist recommendations and repeat a CT in 6 months. That will be one year from the initial finding. I will  see Kara Mitchell back in the office at that time.

## 2014-01-13 LAB — IPS PAP SMEAR ONLY

## 2014-01-16 ENCOUNTER — Other Ambulatory Visit: Payer: Self-pay | Admitting: Obstetrics & Gynecology

## 2014-01-17 ENCOUNTER — Ambulatory Visit: Payer: Self-pay | Admitting: Obstetrics & Gynecology

## 2014-01-18 NOTE — Telephone Encounter (Signed)
Last AEX 01/10/14 Last refill 01/08/13 #26/ 1 refill Last Vitamin D check 01/23/14 = 39.4 by [facility]  Please approve or deny Rx.

## 2014-01-21 ENCOUNTER — Ambulatory Visit: Payer: BC Managed Care – PPO | Admitting: Obstetrics and Gynecology

## 2014-01-21 ENCOUNTER — Telehealth: Payer: Self-pay | Admitting: Obstetrics & Gynecology

## 2014-01-21 ENCOUNTER — Ambulatory Visit: Payer: BC Managed Care – PPO | Admitting: Gynecology

## 2014-01-21 MED ORDER — SOLIFENACIN SUCCINATE 5 MG PO TABS: 5.0000 mg | ORAL_TABLET | Freq: Every day | ORAL | Status: DC

## 2014-01-21 NOTE — Telephone Encounter (Signed)
Pt calling for pap results.

## 2014-01-21 NOTE — Telephone Encounter (Signed)
Vesicare rx sent to [facility].  Encounter closed.

## 2014-01-21 NOTE — Telephone Encounter (Signed)
Spoke with patient. Advised of results as seen below. Patient agreeable. Patient requesting Dr.Miller send over refills of Vesicare to [facility]. Patient's last AEX on 01/10/2014. Advised patient would send message with request to [place] and we would get refill sent. If any further instructions or advice from [place] will call patient back. Patient agreeable.  Notes Recorded by Maryann Conners, CMA on 01/18/2014 at 9:05 AM Patient has AEX scheduled for 2016//kn ------  Notes Recorded by Lyman Speller, MD on 01/14/2014 at 6:09 PM 02 recall  Dr.Miller, refills for Vesicare 5MG  #30 with 12 refills to [facility] pending your approval.

## 2014-02-15 ENCOUNTER — Other Ambulatory Visit: Payer: Self-pay | Admitting: Orthopedic Surgery

## 2014-02-16 ENCOUNTER — Encounter (HOSPITAL_BASED_OUTPATIENT_CLINIC_OR_DEPARTMENT_OTHER): Payer: Self-pay | Admitting: *Deleted

## 2014-02-16 NOTE — Progress Notes (Signed)
No labs needed-asks to be positioned pillow under knees due to back.

## 2014-02-18 ENCOUNTER — Encounter (HOSPITAL_BASED_OUTPATIENT_CLINIC_OR_DEPARTMENT_OTHER): Payer: BC Managed Care – PPO | Admitting: Certified Registered"

## 2014-02-18 ENCOUNTER — Encounter (HOSPITAL_BASED_OUTPATIENT_CLINIC_OR_DEPARTMENT_OTHER)
Admission: RE | Disposition: A | Discharge status: Home / Self Care | Payer: Self-pay | Source: Ambulatory Visit | Attending: Orthopedic Surgery

## 2014-02-18 ENCOUNTER — Ambulatory Visit (HOSPITAL_BASED_OUTPATIENT_CLINIC_OR_DEPARTMENT_OTHER)
Admission: RE | Admit: 2014-02-18 | Discharge: 2014-02-18 | Disposition: A | Discharge status: Home / Self Care | Payer: BC Managed Care – PPO | Source: Ambulatory Visit | Attending: Orthopedic Surgery | Admitting: Orthopedic Surgery

## 2014-02-18 ENCOUNTER — Ambulatory Visit (HOSPITAL_BASED_OUTPATIENT_CLINIC_OR_DEPARTMENT_OTHER): Payer: BC Managed Care – PPO | Admitting: Certified Registered"

## 2014-02-18 ENCOUNTER — Encounter (HOSPITAL_BASED_OUTPATIENT_CLINIC_OR_DEPARTMENT_OTHER): Payer: Self-pay | Admitting: *Deleted

## 2014-02-18 DIAGNOSIS — E079 Disorder of thyroid, unspecified: Secondary | ICD-10-CM | POA: Insufficient documentation

## 2014-02-18 DIAGNOSIS — Z87891 Personal history of nicotine dependence: Secondary | ICD-10-CM | POA: Insufficient documentation

## 2014-02-18 DIAGNOSIS — M65849 Other synovitis and tenosynovitis, unspecified hand: Principal | ICD-10-CM

## 2014-02-18 DIAGNOSIS — R32 Unspecified urinary incontinence: Secondary | ICD-10-CM | POA: Insufficient documentation

## 2014-02-18 DIAGNOSIS — M6530 Trigger finger, unspecified finger: Secondary | ICD-10-CM | POA: Insufficient documentation

## 2014-02-18 DIAGNOSIS — K449 Diaphragmatic hernia without obstruction or gangrene: Secondary | ICD-10-CM | POA: Insufficient documentation

## 2014-02-18 DIAGNOSIS — M65839 Other synovitis and tenosynovitis, unspecified forearm: Secondary | ICD-10-CM | POA: Insufficient documentation

## 2014-02-18 DIAGNOSIS — R911 Solitary pulmonary nodule: Secondary | ICD-10-CM | POA: Insufficient documentation

## 2014-02-18 HISTORY — DX: Other specified postprocedural states: R11.2

## 2014-02-18 HISTORY — DX: Adverse effect of unspecified anesthetic, initial encounter: T41.45XA

## 2014-02-18 HISTORY — PX: TRIGGER FINGER RELEASE: SHX641

## 2014-02-18 HISTORY — DX: Other complications of anesthesia, initial encounter: T88.59XA

## 2014-02-18 HISTORY — DX: Other specified postprocedural states: Z98.890

## 2014-02-18 LAB — POCT HEMOGLOBIN-HEMACUE: HEMOGLOBIN: 13.8 g/dL (ref 12.0–15.0)

## 2014-02-18 SURGERY — RELEASE, A1 PULLEY, FOR TRIGGER FINGER
Anesthesia: General | Site: Thumb | Laterality: Right

## 2014-02-18 MED ORDER — OXYCODONE HCL 5 MG/5ML PO SOLN: 5.0000 mg | Freq: Once | ORAL | Status: DC | PRN

## 2014-02-18 MED ORDER — LIDOCAINE HCL (CARDIAC) 20 MG/ML IV SOLN
INTRAVENOUS | Status: DC | PRN
  Administered 2014-02-18: 60 mg via INTRAVENOUS

## 2014-02-18 MED ORDER — LIDOCAINE HCL 2 % IJ SOLN
INTRAMUSCULAR | Status: DC | PRN
  Administered 2014-02-18: 1 mL

## 2014-02-18 MED ORDER — ONDANSETRON HCL 4 MG/2ML IJ SOLN
INTRAMUSCULAR | Status: DC | PRN
  Administered 2014-02-18: 4 mg via INTRAVENOUS

## 2014-02-18 MED ORDER — METHYLPREDNISOLONE ACETATE 40 MG/ML IJ SUSP
INTRAMUSCULAR | Status: AC
  Filled 2014-02-18: qty 1

## 2014-02-18 MED ORDER — ACETAMINOPHEN-CODEINE #3 300-30 MG PO TABS: 1.0000 | ORAL_TABLET | ORAL | Status: DC | PRN

## 2014-02-18 MED ORDER — CHLORHEXIDINE GLUCONATE 4 % EX LIQD: 60.0000 mL | Freq: Once | CUTANEOUS | Status: DC

## 2014-02-18 MED ORDER — MORPHINE SULFATE 2 MG/ML IJ SOLN: 1.0000 mg | INTRAMUSCULAR | Status: DC | PRN

## 2014-02-18 MED ORDER — ACETAMINOPHEN 10 MG/ML IV SOLN
INTRAVENOUS | Status: AC
Start: 2014-02-18 — End: 2014-02-18
  Filled 2014-02-18: qty 100

## 2014-02-18 MED ORDER — MIDAZOLAM HCL 2 MG/2ML IJ SOLN
INTRAMUSCULAR | Status: AC
  Filled 2014-02-18: qty 2

## 2014-02-18 MED ORDER — LACTATED RINGERS IV SOLN
INTRAVENOUS | Status: DC
  Administered 2014-02-18: 07:00:00 via INTRAVENOUS

## 2014-02-18 MED ORDER — PROPOFOL INFUSION 10 MG/ML OPTIME
INTRAVENOUS | Status: DC | PRN
  Administered 2014-02-18: 75 ug/kg/min via INTRAVENOUS

## 2014-02-18 MED ORDER — ACETAMINOPHEN 10 MG/ML IV SOLN: 1000.0000 mg | Freq: Four times a day (QID) | INTRAVENOUS | Status: DC

## 2014-02-18 MED ORDER — LIDOCAINE HCL 2 % IJ SOLN
INTRAMUSCULAR | Status: AC
  Filled 2014-02-18: qty 20

## 2014-02-18 MED ORDER — ACETAMINOPHEN 10 MG/ML IV SOLN
1000.0000 mg | Freq: Once | INTRAVENOUS | Status: AC
  Administered 2014-02-18: 1000 mg via INTRAVENOUS

## 2014-02-18 MED ORDER — FENTANYL CITRATE 0.05 MG/ML IJ SOLN
INTRAMUSCULAR | Status: AC
  Filled 2014-02-18: qty 2

## 2014-02-18 MED ORDER — MIDAZOLAM HCL 2 MG/2ML IJ SOLN: 1.0000 mg | INTRAMUSCULAR | Status: DC | PRN

## 2014-02-18 MED ORDER — PROPOFOL 10 MG/ML IV EMUL
INTRAVENOUS | Status: AC
  Filled 2014-02-18: qty 50

## 2014-02-18 MED ORDER — FENTANYL CITRATE 0.05 MG/ML IJ SOLN: 50.0000 ug | INTRAMUSCULAR | Status: DC | PRN

## 2014-02-18 MED ORDER — OXYCODONE HCL 5 MG PO TABS: 5.0000 mg | ORAL_TABLET | Freq: Once | ORAL | Status: DC | PRN

## 2014-02-18 MED ORDER — PROMETHAZINE HCL 25 MG/ML IJ SOLN: 6.2500 mg | INTRAMUSCULAR | Status: DC | PRN

## 2014-02-18 SURGICAL SUPPLY — 39 items
BANDAGE ADH SHEER 1  50/CT (GAUZE/BANDAGES/DRESSINGS) IMPLANT
BANDAGE COBAN STERILE 2 (GAUZE/BANDAGES/DRESSINGS) ×2 IMPLANT
BLADE SURG 15 STRL LF DISP TIS (BLADE) ×1 IMPLANT
BLADE SURG 15 STRL SS (BLADE) ×2
BNDG CMPR 9X4 STRL LF SNTH (GAUZE/BANDAGES/DRESSINGS) ×1
BNDG ESMARK 4X9 LF (GAUZE/BANDAGES/DRESSINGS) ×1 IMPLANT
BRUSH SCRUB EZ PLAIN DRY (MISCELLANEOUS) ×2 IMPLANT
CORDS BIPOLAR (ELECTRODE) IMPLANT
COVER MAYO STAND STRL (DRAPES) ×2 IMPLANT
COVER TABLE BACK 60X90 (DRAPES) ×2 IMPLANT
CUFF TOURNIQUET SINGLE 18IN (TOURNIQUET CUFF) ×1 IMPLANT
DECANTER SPIKE VIAL GLASS SM (MISCELLANEOUS) IMPLANT
DRAPE EXTREMITY T 121X128X90 (DRAPE) ×2 IMPLANT
DRAPE SURG 17X23 STRL (DRAPES) ×2 IMPLANT
GAUZE SPONGE 4X4 12PLY STRL (GAUZE/BANDAGES/DRESSINGS) ×2 IMPLANT
GLOVE BIOGEL M STRL SZ7.5 (GLOVE) ×1 IMPLANT
GLOVE BIOGEL PI IND STRL 7.0 (GLOVE) IMPLANT
GLOVE BIOGEL PI INDICATOR 7.0 (GLOVE) ×1
GLOVE ECLIPSE 7.0 STRL STRAW (GLOVE) ×1 IMPLANT
GLOVE ORTHO TXT STRL SZ7.5 (GLOVE) ×2 IMPLANT
GOWN STRL REUS W/ TWL LRG LVL3 (GOWN DISPOSABLE) ×1 IMPLANT
GOWN STRL REUS W/ TWL XL LVL3 (GOWN DISPOSABLE) ×2 IMPLANT
GOWN STRL REUS W/TWL LRG LVL3 (GOWN DISPOSABLE) ×2
GOWN STRL REUS W/TWL XL LVL3 (GOWN DISPOSABLE) ×2
NEEDLE 27GAX1X1/2 (NEEDLE) ×1 IMPLANT
PACK BASIN DAY SURGERY FS (CUSTOM PROCEDURE TRAY) ×2 IMPLANT
PADDING CAST ABS 4INX4YD NS (CAST SUPPLIES) ×1
PADDING CAST ABS COTTON 4X4 ST (CAST SUPPLIES) ×1 IMPLANT
STOCKINETTE 4X48 STRL (DRAPES) ×2 IMPLANT
STRIP CLOSURE SKIN 1/2X4 (GAUZE/BANDAGES/DRESSINGS) IMPLANT
SUT ETHILON 5 0 P 3 18 (SUTURE)
SUT NYLON ETHILON 5-0 P-3 1X18 (SUTURE) IMPLANT
SUT PROLENE 3 0 PS 2 (SUTURE) IMPLANT
SUT PROLENE 4 0 P 3 18 (SUTURE) ×1 IMPLANT
SYR 3ML 23GX1 SAFETY (SYRINGE) IMPLANT
SYR CONTROL 10ML LL (SYRINGE) ×1 IMPLANT
TOWEL OR 17X24 6PK STRL BLUE (TOWEL DISPOSABLE) ×2 IMPLANT
TRAY DSU PREP LF (CUSTOM PROCEDURE TRAY) ×2 IMPLANT
UNDERPAD 30X30 INCONTINENT (UNDERPADS AND DIAPERS) ×2 IMPLANT

## 2014-02-18 NOTE — H&P (Signed)
Kara Mitchell is an 62 y.o. female.   Chief Complaint: painful locking right thumb stenosing tenosynovitis   HPI: 6 week history of painful,locking of right thumb in flexion.  Failed injection 01/12/2014.  Past Medical History  Diagnosis Date  . Thyroid disease   . Hiatal hernia   . Urinary incontinence   . Rosacea   . Strabismus   . Lung nodule     right, lower-seen on CT, had second CT 01/06/14-followed by Dr Kara Mitchell  . Complication of anesthesia     no fent/versed  . PONV (postoperative nausea and vomiting)     Past Surgical History  Procedure Laterality Date  . Cystocele repair  2007  . Enterocele repair  2010  . Tonsillectomy and adenoidectomy  1974  . Knee surgery      x2-lt  . Breast biopsy  1995  . Other surgical history  2001    HNP L4-5  . Esophageal dilation  2004;2007  . Other surgical history      HNP repair- Kara Mitchell  . Abdominal hysterectomy    . Blepharoplasty  10/2012  . Cosmetic surgery      blethoplasty-bilat  . Vein ligation and stripping  2009  . Breast biopsy  1/95  . Back surgery  2012    lumb lam    Family History  Problem Relation Age of Onset  . Adopted: Yes  . Cancer       NO FAMILY HX   Social History:  reports that she quit smoking about 21 years ago. Her smoking use included Cigarettes. She has a 37.5 pack-year smoking history. She has never used smokeless tobacco. She reports that she drinks alcohol. She reports that she does not use illicit drugs.  Allergies:  Allergies  Allergen Reactions  . Fentanyl Other (See Comments)    Low blood pressure  . Neosporin [Neomycin-Bacitracin Zn-Polymyx] Other (See Comments)    whelps  . Polysporin [Bacitracin-Polymyxin B] Other (See Comments)    whelps  . Tetanus Toxoids Other (See Comments)    fainting  . Versed [Midazolam] Other (See Comments)    Low blood pressure    Medications Prior to Admission  Medication Sig Dispense Refill  . estradiol (ESTRACE) 0.1 MG/GM vaginal cream  Place 1 Applicatorful vaginally.      . pantoprazole (PROTONIX) 40 MG tablet Take 40 mg by mouth daily.      . solifenacin (VESICARE) 5 MG tablet Take 1 tablet (5 mg total) by mouth daily.  30 tablet  12  . thyroid (ARMOUR THYROID) 30 MG tablet Take 30 mg by mouth daily.      . Vitamin D, Ergocalciferol, (DRISDOL) 50000 UNITS CAPS capsule take 1 capsule by mouth every week  4 capsule  8    No results found for this or any previous visit (from the past 48 hour(s)). No results found.  Review of Systems  Constitutional: Negative.   HENT: Negative.   Eyes: Negative.        Corrective lenses  Respiratory:       History of pulmonary nodule monitored by CT scans  Cardiovascular: Negative.   Gastrointestinal: Negative.   Musculoskeletal:       Right trigger thumb  Skin: Negative.   Endo/Heme/Allergies: Negative.   Psychiatric/Behavioral: Negative.     Height 5\' 4"  (1.626 m), weight 80.74 kg (178 lb), last menstrual period 08/26/2001. Physical Exam  Constitutional: She appears well-developed and well-nourished.  Eyes: Conjunctivae and EOM are normal. Pupils are  equal, round, and reactive to light.  Neck: Normal range of motion. Neck supple.  Cardiovascular: Normal rate, regular rhythm, normal heart sounds and intact distal pulses.   Respiratory: Effort normal and breath sounds normal.  GI: Soft. Bowel sounds are normal.  Musculoskeletal: Normal range of motion.  Locking right trigger thumb.  Tender over A 1 pulley.  Neurological: She is alert. She has normal reflexes.  Skin: Skin is warm and dry.  Psychiatric: She has a normal mood and affect. Her behavior is normal. Judgment and thought content normal.     Assessment/Plan Locking right trigger thumb. Release of right thumb A 1 pulley under MAC.  [name],ROBERT Mitchell 02/18/2014, 6:14 AM

## 2014-02-18 NOTE — Discharge Instructions (Addendum)
Keep bandage clean and dry Cover with plastic bag while showering.  Use pain medication as needed.  Tylenol and or aleve are appropriate first line mediations.   Post Anesthesia Home Care Instructions  Activity: Get plenty of rest for the remainder of the day. A responsible adult should stay with you for 24 hours following the procedure.  For the next 24 hours, DO NOT: -Drive a car -Paediatric nurse -Drink alcoholic beverages -Take any medication unless instructed by your physician -Make any legal decisions or sign important papers.  Meals: Start with liquid foods such as gelatin or soup. Progress to regular foods as tolerated. Avoid greasy, spicy, heavy foods. If nausea and/or vomiting occur, drink only clear liquids until the nausea and/or vomiting subsides. Call your physician if vomiting continues.  Special Instructions/Symptoms: Your throat may feel dry or sore from the anesthesia or the breathing tube placed in your throat during surgery. If this causes discomfort, gargle with warm salt water. The discomfort should disappear within 24 hours.

## 2014-02-18 NOTE — Op Note (Signed)
NAMEKYAH, BUESING                ACCOUNT NO.:  [id number]  MEDICAL RECORD NO.:  [id number]  LOCATION:                                 FACILITY:  PHYSICIAN:  Youlanda Mighty. Sypher, M.D.      DATE OF BIRTH:  DATE OF PROCEDURE:  02/18/2014 DATE OF DISCHARGE:                              OPERATIVE REPORT   PREOPERATIVE DIAGNOSIS:  Locking painful right thumb stenosing tenosynovitis at A1 pulley.  POSTOPERATIVE DIAGNOSIS:  Locking painful right thumb stenosing tenosynovitis at A1 pulley.  OPERATION:  Release of right thumb A1 pulley.  OPERATING SURGEON:  Youlanda Mighty. Sypher, MD  ASSISTANT:  Nurse.  ANESTHESIA:  A 2% lidocaine flexor sheath block and field block of right thumb supplemented by IV sedation.  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  Her last bout of this predicament has been 6 weeks course of locking.  She has failed steroid injection in early May.  She now returns with pain, morning locking, and tenderness over the A1 pulley.  Her A1 pulley is palpably thickened.  We advised to proceed with release of her A1 pulley under local anesthesia and sedation.  Informed consent during which we discussed the anticipated benefit of surgery, i.e. relief of pain and triggering and the potential risks including infection, failure to relieve all her pain, and hypertrophic scarring she now presents for surgery.  Detailed anesthesia and informed consent was provided by Dr. Orene Desanctis.  DESCRIPTION OF PROCEDURE:  Zainab Crumrine was brought to room 1 of the [facility] and placed in supine position on the operating table.  Following IV sedation under Dr. Griffin Dakin supervision, the right thumb was prepped with alcohol and Betadine followed by infiltration of 1 mL of 2% lidocaine without epinephrine into the region of the intended incision and  flexor sheath of the right thumb.  After few moments, excellent anesthesia was achieved.  The right hand and arm were then prepped with Betadine soap and solution, sterilely draped.  A pneumatic tourniquet was applied to the proximal right brachium.  Following exsanguination of the right hand and arm with Esmarch bandage, arterial tourniquet was inflated to 220 mmHg.  The procedure commenced with a short transverse incision directly over the palpably thickened A1 pulley.  The subcutaneous tissues were carefully divided taking care to gently place 3 Ragnell retractors.  The radial proper digital nerve was retracted.  The A1 pulley was then identified, split with scalpel scissors.  With tips closed was passed proximally and distally to be sure there were no other sites of entrapment.  Thereafter, Ms. Kilker demonstrated full range of motion of the IP joint without locking.  The flexor tendon was irregular due to the effects of chronic stenosing tenosynovitis.  This should smooth over time once the stenosis has been relieved.  The wound was then repaired with intradermal 4-0 Prolene.  Xeroflo was applied followed by sterile gauze and a Coban dressing.  For aftercare, Ms. Imparato is provided a prescription for Tylenol with Codeine #3 one p.o. q.4-6 hours p.r.n. pain, 20 tablets  without refill.  I will see her back in followup in 1 week for suture removal.     Youlanda Mighty. Sypher, M.D.     RVS/MEDQ  D:  02/18/2014  T:  02/18/2014  Job:  [id number]

## 2014-02-18 NOTE — Anesthesia Procedure Notes (Signed)
Procedure Name: MAC Date/Time: 02/18/2014 7:35 AM Performed by: BLOCKER, TIMOTHY Pre-anesthesia Checklist: Patient identified, Emergency Drugs available, Suction available, Patient being monitored and Timeout performed Patient Re-evaluated:Patient Re-evaluated prior to inductionOxygen Delivery Method: Simple face mask

## 2014-02-18 NOTE — Op Note (Signed)
[id number] 

## 2014-02-18 NOTE — Anesthesia Postprocedure Evaluation (Signed)
  Anesthesia Post-op Note  Patient: Kara Mitchell  Procedure(s) Performed: Procedure(s): RELEASE A-1 PULLEY RIGHT THUMB/POSSIBLE INJ TO LEFT THUMB (Right)  Patient Location: PACU  Anesthesia Type:MAC  Level of Consciousness: awake and alert   Airway and Oxygen Therapy: Patient Spontanous Breathing  Post-op Pain: none  Post-op Assessment: Post-op Vital signs reviewed  Post-op Vital Signs: stable  Last Vitals:  Filed Vitals:   02/18/14 0830  BP: 136/88  Pulse: 65  Temp:   Resp: 24    Complications: No apparent anesthesia complications

## 2014-02-18 NOTE — Transfer of Care (Signed)
Immediate Anesthesia Transfer of Care Note  Patient: Kara Mitchell  Procedure(s) Performed: Procedure(s): RELEASE A-1 PULLEY RIGHT THUMB/POSSIBLE INJ TO LEFT THUMB (Right)  Patient Location: PACU  Anesthesia Type:MAC  Level of Consciousness: awake, alert , oriented and patient cooperative  Airway & Oxygen Therapy: Patient Spontanous Breathing and Patient connected to face mask oxygen  Post-op Assessment: Report given to PACU RN and Post -op Vital signs reviewed and stable  Post vital signs: Reviewed and stable  Complications: No apparent anesthesia complications

## 2014-02-18 NOTE — Brief Op Note (Signed)
02/18/2014  8:03 AM  PATIENT:  Merideth Abbey  63 y.o. female  PRE-OPERATIVE DIAGNOSIS:  STENOSING TENOSYNOVITIS RIGHT THUMB  POST-OPERATIVE DIAGNOSIS:  STENOSING TENOSYNOVITIS RIGHT THUMB  PROCEDURE:  Procedure(s): RELEASE A-1 PULLEY RIGHT THUMB/POSSIBLE INJ TO LEFT THUMB (Right)  SURGEON:  Surgeon(s) and Role:    * Cammie Sickle, MD - Primary  PHYSICIAN ASSISTANT:   ASSISTANTS: nurse   ANESTHESIA:   MAC  EBL:  Total I/O In: 700 [I.V.:700] Out: -   BLOOD ADMINISTERED:none  DRAINS: none   LOCAL MEDICATIONS USED:  LIDOCAINE   SPECIMEN:  No Specimen  DISPOSITION OF SPECIMEN:  N/A  COUNTS:  YES  TOURNIQUET:   Total Tourniquet Time Documented: Upper Arm (Left) - 7 minutes Total: Upper Arm (Left) - 7 minutes   DICTATION: .Other Dictation: Dictation Number [id number]  PLAN OF CARE: Discharge to home after PACU  PATIENT DISPOSITION:  PACU - hemodynamically stable.   Delay start of Pharmacological VTE agent (>24hrs) due to surgical blood loss or risk of bleeding: not applicable

## 2014-02-18 NOTE — Anesthesia Preprocedure Evaluation (Addendum)
Anesthesia Evaluation  Patient identified by MRN, date of birth, ID band Patient awake  General Assessment Comment:canoot take fent/versed  Reviewed: Allergy & Precautions, H&P , NPO status , Patient's Chart, lab work & pertinent test results  History of Anesthesia Complications (+) PONV and history of anesthetic complications  Airway Mallampati: I  Neck ROM: Full    Dental  (+) Teeth Intact   Pulmonary neg pulmonary ROS, former smoker,  breath sounds clear to auscultation        Cardiovascular negative cardio ROS  Rhythm:Regular Rate:Normal     Neuro/Psych    GI/Hepatic Neg liver ROS, hiatal hernia,   Endo/Other  negative endocrine ROS  Renal/GU negative Renal ROS     Musculoskeletal   Abdominal   Peds  Hematology   Anesthesia Other Findings   Reproductive/Obstetrics                         Anesthesia Physical Anesthesia Plan  ASA: II  Anesthesia Plan: General   Post-op Pain Management:    Induction: Intravenous  Airway Management Planned: LMA  Additional Equipment:   Intra-op Plan:   Post-operative Plan:   Informed Consent: I have reviewed the patients History and Physical, chart, labs and discussed the procedure including the risks, benefits and alternatives for the proposed anesthesia with the patient or authorized representative who has indicated his/her understanding and acceptance.     Plan Discussed with:   Anesthesia Plan Comments:        Anesthesia Quick Evaluation

## 2014-02-21 ENCOUNTER — Encounter (HOSPITAL_BASED_OUTPATIENT_CLINIC_OR_DEPARTMENT_OTHER): Payer: Self-pay | Admitting: Orthopedic Surgery

## 2014-06-14 ENCOUNTER — Telehealth: Payer: Self-pay

## 2014-06-14 ENCOUNTER — Other Ambulatory Visit: Payer: Self-pay | Admitting: *Deleted

## 2014-06-14 DIAGNOSIS — R911 Solitary pulmonary nodule: Secondary | ICD-10-CM

## 2014-06-14 NOTE — Telephone Encounter (Signed)
Patient notified of BMD results-copy will be scanned in epic.//kn

## 2014-06-27 ENCOUNTER — Encounter (HOSPITAL_BASED_OUTPATIENT_CLINIC_OR_DEPARTMENT_OTHER): Payer: Self-pay | Admitting: Orthopedic Surgery

## 2014-07-12 ENCOUNTER — Encounter: Payer: Self-pay | Admitting: Thoracic Surgery (Cardiothoracic Vascular Surgery)

## 2014-07-12 ENCOUNTER — Ambulatory Visit
Admission: RE | Admit: 2014-07-12 | Discharge: 2014-07-12 | Disposition: A | Discharge status: Home / Self Care | Payer: BC Managed Care – PPO | Source: Ambulatory Visit | Attending: Thoracic Surgery (Cardiothoracic Vascular Surgery) | Admitting: Thoracic Surgery (Cardiothoracic Vascular Surgery)

## 2014-07-12 ENCOUNTER — Ambulatory Visit (INDEPENDENT_AMBULATORY_CARE_PROVIDER_SITE_OTHER): Payer: BC Managed Care – PPO | Admitting: Thoracic Surgery (Cardiothoracic Vascular Surgery)

## 2014-07-12 VITALS — BP 126/83 | HR 84 | Resp 16 | Ht 64.0 in | Wt 157.0 lb

## 2014-07-12 DIAGNOSIS — R911 Solitary pulmonary nodule: Secondary | ICD-10-CM

## 2014-07-12 NOTE — Progress Notes (Signed)
HPI:  63 year old woman with a remote history of tobacco abuse who was found last fall have a small left lower lobe nodule.  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 She has a past history of tobacco abuse, starting about age 79 and continuing until she was 24. She smoked as much as one and a half packs per day (37.5 pack years). She quit in 1994. She has no known family history as she was adopted. Last fall she was complaining of shortness of breath. She had a chest x-ray done as part of that workup. It showed a possible left upper lobe nodule. This was followed by a CT of the chest. It showed there was not a nodule in the left upper lobe. A very small 2 1/2- 3 mm nodule was seen in the superior segment of the left lower lobe. A repeat CT for one year was recommended.  She initially was happy with waiting a year for a CT scan. However over time she became more more concerned that it could be a lung cancer and was very anxious about it. I saw her in May and repeated the CT. There was no change.  She says she's been feeling well with no recent medical issues. She does sometimes short of breath with exertion, but this unchanged. She does not have any chest pain, pressure, or tightness. Her weight has been stable.  Past Medical History  Diagnosis Date  . Thyroid disease   . Hiatal hernia   . Urinary incontinence   . Rosacea   . Strabismus   . Lung nodule     right, lower-seen on CT, had second CT 01/06/14-followed by Dr Roxan Hockey  . Complication of anesthesia     no fent/versed  . PONV (postoperative nausea and vomiting)     Current Outpatient Prescriptions  Medication Sig Dispense Refill  . pantoprazole (PROTONIX) 40 MG tablet Take 40 mg by mouth daily.    . solifenacin (VESICARE) 5 MG tablet Take 1 tablet (5 mg total) by mouth daily. 30 tablet 12  . thyroid (ARMOUR THYROID) 30 MG tablet Take 30 mg by mouth daily.    . Vitamin D, Ergocalciferol, (DRISDOL) 50000 UNITS CAPS capsule take 1  capsule by mouth every week 4 capsule 8   No current facility-administered medications for this visit.    Physical Exam BP 126/83 mmHg  Pulse 84  Resp 16  Ht 5\' 4"  (1.626 m)  Wt 157 lb (71.215 kg)  BMI 26.94 kg/m2  SpO2 98%  LMP 08/26/2001 63 year old woman in no acute distress Well-developed well-nourished  Diagnostic Tests: CT CHEST WITHOUT CONTRAST  TECHNIQUE: Multidetector CT imaging of the chest was performed following the standard protocol without IV contrast.  COMPARISON: Chest CT 01/06/2014.  FINDINGS: Mediastinum: Heart size is normal. There is no significant pericardial fluid, thickening or pericardial calcification. No pathologically enlarged mediastinal or hilar lymph nodes. Please note that accurate exclusion of hilar adenopathy is limited on noncontrast CT scans. Esophagus is unremarkable in appearance.  Lungs/Pleura: 2 mm subpleural nodule in the posterior aspect of the superior segment of the left lower lobe is unchanged compared to prior study 06/03/2013 and can be considered benign requiring no future imaging followup. No other suspicious appearing pulmonary nodules or masses are noted. No consolidative airspace disease. No pleural effusions.  Upper Abdomen: Unremarkable.  Musculoskeletal: There are no aggressive appearing lytic or blastic lesions noted in the visualized portions of the skeleton.  IMPRESSION: 1. 2 mm subpleural nodule in the superior  segment of the left lower lobe is unchanged compared to prior studies, presumably a benign subpleural lymph node. No future followup examinations are warranted.   Electronically Signed  By: Vinnie Langton M.D.  On: 07/12/2014 11:08  Impression: 63 year old woman with a remote history of tobacco abuse. She had a CT scan a year ago which showed a 2 mm subpleural nodule in the superior segment of the left lower lobe. This is unchanged over time. Per radiology this can be considered  benign and no further follow-up is necessary.  She has over a 30-pack-year history of smoking but quit 21 years ago so she does not meet criteria for low dose CT screening.  Plan: No further follow-up necessary for left lower lobe nodule Follow-up with her primary I would be happy to see her back any time if I can be of any further assistance with her care.

## 2014-08-30 ENCOUNTER — Other Ambulatory Visit: Payer: Self-pay | Admitting: Endocrinology

## 2014-08-30 DIAGNOSIS — M545 Low back pain: Secondary | ICD-10-CM

## 2014-08-30 DIAGNOSIS — M5410 Radiculopathy, site unspecified: Secondary | ICD-10-CM

## 2014-08-31 ENCOUNTER — Ambulatory Visit
Admission: RE | Admit: 2014-08-31 | Discharge: 2014-08-31 | Disposition: A | Discharge status: Home / Self Care | Payer: BLUE CROSS/BLUE SHIELD | Source: Ambulatory Visit | Attending: Endocrinology | Admitting: Endocrinology

## 2014-08-31 DIAGNOSIS — M5410 Radiculopathy, site unspecified: Secondary | ICD-10-CM

## 2014-08-31 DIAGNOSIS — M545 Low back pain: Secondary | ICD-10-CM

## 2014-12-22 ENCOUNTER — Encounter (HOSPITAL_COMMUNITY): Payer: Self-pay

## 2015-01-11 ENCOUNTER — Telehealth: Payer: Self-pay | Admitting: Obstetrics & Gynecology

## 2015-01-11 NOTE — Telephone Encounter (Signed)
Patients appointment was canceled 01/16/15 due to [place] surgery conflict. Patient wanted me to relay a message to [place] "I am very disappointed in your office. I scheduled this appointment over a year ago." I offered patient to see NP or Dr.Silva. Patient said " The only reason I come to your office is to see Dr.Miller". "I will be sure to let Dr.Miller know how I feel about being canceled on after having this appointment for over a year" I told patient I was sorry that this happened, but Dr.Miller is a Psychologist, sport and exercise and it is a possibly she will need to cancel appointments due to surgery. Patient said "I know it is not your fault, but It is not mine either. Patient did schedule for 02/06/15 @ 12:45pm.

## 2015-01-16 ENCOUNTER — Ambulatory Visit: Payer: BC Managed Care – PPO | Admitting: Obstetrics & Gynecology

## 2015-01-16 ENCOUNTER — Encounter: Payer: Self-pay | Admitting: *Deleted

## 2015-01-16 NOTE — Telephone Encounter (Signed)
Reviewed with Dr Sabra Heck, letter to your office for review.

## 2015-02-03 NOTE — Telephone Encounter (Signed)
Will discuss in person with pt when here for visit.  Ok to delete letter.  Thanks.

## 2015-02-06 ENCOUNTER — Ambulatory Visit (INDEPENDENT_AMBULATORY_CARE_PROVIDER_SITE_OTHER): Payer: BLUE CROSS/BLUE SHIELD | Admitting: Obstetrics & Gynecology

## 2015-02-06 ENCOUNTER — Encounter: Payer: Self-pay | Admitting: Obstetrics & Gynecology

## 2015-02-06 VITALS — BP 120/74 | HR 64 | Resp 16 | Ht 64.0 in | Wt 157.0 lb

## 2015-02-06 DIAGNOSIS — Z0000 Encounter for general adult medical examination without abnormal findings: Secondary | ICD-10-CM

## 2015-02-06 DIAGNOSIS — Z01419 Encounter for gynecological examination (general) (routine) without abnormal findings: Secondary | ICD-10-CM

## 2015-02-06 LAB — POCT URINALYSIS DIPSTICK
Bilirubin, UA: NEGATIVE
Blood, UA: NEGATIVE
Glucose, UA: NEGATIVE
Ketones, UA: NEGATIVE
Leukocytes, UA: NEGATIVE
Nitrite, UA: NEGATIVE
PROTEIN UA: NEGATIVE
Urobilinogen, UA: NEGATIVE
pH, UA: 7

## 2015-02-06 MED ORDER — SOLIFENACIN SUCCINATE 5 MG PO TABS: 5.0000 mg | ORAL_TABLET | Freq: Every day | ORAL | Status: DC

## 2015-02-06 NOTE — Addendum Note (Signed)
Addended by: Megan Salon on: 02/06/2015 03:53 PM   Modules accepted: Miquel Dunn

## 2015-02-06 NOTE — Telephone Encounter (Signed)
Letter deleted. Encounter closed.

## 2015-02-06 NOTE — Progress Notes (Addendum)
64 y.o. [id number] DivorcedCaucasianF here for annual exam.  Doing well.  Had trigger finger surgery and low back surgery last year.  Had a lumbar laminectomy--Dr. Arnoldo Morale.    Has not had any vaginal bleeding.   PCP:  Dr. Forde Dandy.  Lab work was done in April.  Thyroid medication did not change.  Goes every six months.    Patient's last menstrual period was 08/26/2001.          Sexually active: No.  The current method of family planning is status post hysterectomy.    Exercising: No.  not regularly Smoker:  Former smoker   Health Maintenance: Pap:  01/10/14 WNL History of abnormal Pap:  no MMG:  2015-[facility] Colonoscopy:  2014- repeat 2017 with Dr Earlean Shawl (patient adopted, no family history) BMD:   06/02/14 -1.2 TDaP:  9/10 Screening Labs: PCP, Hb today: PCP, Urine today: PH-7   reports that she quit smoking about 22 years ago. Her smoking use included Cigarettes. She has a 37.5 pack-year smoking history. She has never used smokeless tobacco. She reports that she drinks alcohol. She reports that she does not use illicit drugs.  Past Medical History  Diagnosis Date  . Thyroid disease   . Hiatal hernia   . Urinary incontinence   . Rosacea   . Strabismus   . Lung nodule     right, lower-seen on CT, had second CT 01/06/14-followed by Dr Roxan Hockey  . Complication of anesthesia     no fent/versed  . PONV (postoperative nausea and vomiting)     Past Surgical History  Procedure Laterality Date  . Cystocele repair  2007  . Enterocele repair  2010  . Tonsillectomy and adenoidectomy  1974  . Knee surgery      x2-lt  . Breast biopsy  1995  . Other surgical history  2001    HNP L4-5  . Esophageal dilation  2004;2007  . Other surgical history      HNP repair- Earle Gell  . Abdominal hysterectomy    . Blepharoplasty  10/2012  . Cosmetic surgery      blethoplasty-bilat  . Vein ligation and stripping  2009  . Breast biopsy  1/95  . Back surgery  2012    lumb lam  . Trigger finger  release Right 02/18/2014    Procedure: RELEASE A-1 PULLEY RIGHT THUMB/POSSIBLE INJ TO LEFT THUMB;  Surgeon: Cammie Sickle, MD;  Location: [facility];  Service: Orthopedics;  Laterality: Right;    Current Outpatient Prescriptions  Medication Sig Dispense Refill  . metroNIDAZOLE (METROGEL) 1 % gel   2  . pantoprazole (PROTONIX) 40 MG tablet Take 40 mg by mouth daily.    . solifenacin (VESICARE) 5 MG tablet Take 1 tablet (5 mg total) by mouth daily. 30 tablet 12  . thyroid (ARMOUR THYROID) 30 MG tablet Take 30 mg by mouth daily.    . Vitamin D, Ergocalciferol, (DRISDOL) 50000 UNITS CAPS capsule take 1 capsule by mouth every week 4 capsule 8   No current facility-administered medications for this visit.    Family History  Problem Relation Age of Onset  . Adopted: Yes  . Cancer       NO FAMILY HX    ROS:  Pertinent items are noted in HPI.  Otherwise, a comprehensive ROS was negative.  Exam:    General appearance: alert, cooperative and appears stated age Head: Normocephalic, without obvious abnormality, atraumatic Neck: no adenopathy, supple, symmetrical, trachea midline and thyroid  normal to inspection and palpation Lungs: clear to auscultation bilaterally Breasts: normal appearance, no masses or tenderness Heart: regular rate and rhythm Abdomen: soft, non-tender; bowel sounds normal; no masses,  no organomegaly Extremities: extremities normal, atraumatic, no cyanosis or edema Skin: Skin color, texture, turgor normal. No rashes or lesions Lymph nodes: Cervical, supraclavicular, and axillary nodes normal. No abnormal inguinal nodes palpated Neurologic: Grossly normal    Pelvic: External genitalia:  no lesions              Urethra:  normal appearing urethra with no masses, tenderness or lesions              Bartholins and Skenes: normal                 Vagina: normal appearing vagina with normal color and discharge, no lesions, 3rd degree rectocele               Cervix: absent              Pap taken: No. Bimanual Exam:  Uterus:  uterus absent              Adnexa: no mass, fullness, tenderness               Rectovaginal: Confirms               Anus:  normal sphincter tone, no lesions  Chaperone was present for exam.  A:  Well Woman with normal exam S/p TVH, anterior repair 3/09, then cystocele repair with mesh with Dr. Lawrence Santiago 2007 H/o use of #4 incontinence ring.  Has discarded this now due to pain insertion Grade 3 rectocele OAB  P: Mammogram yearly. Doing 3D yearly.  Did have done 2015.  Have called to get a copy from [facility] pap smear last year done due to pt request.  No pap today. Labs with Dr. Alford Highland 5mg  daily.  #30/13.   Return annually or prn

## 2015-02-06 NOTE — Progress Notes (Signed)
Called over to Dr.Medoff's office to schedule patient for colonoscopy. Per his office patient had last colonoscopy in 2014 and is not due until 2019. Patient states she would like to return call to his office personally to discuss. Advised I will let Dr.Miller know. Patient is agreeable.

## 2015-02-07 ENCOUNTER — Telehealth: Payer: Self-pay | Admitting: Emergency Medicine

## 2015-02-07 NOTE — Telephone Encounter (Signed)
-----   Message from Megan Salon, MD sent at 02/06/2015  5:43 PM EDT ----- Regarding: RE: options instead of vesicare It is ok for her to try Enablex 7.5mg  if she would like.  Could cause constipation, dry eyes, or dry mouth.  She thought it was going to be a "high" copay so would you clarify this with her and if desires a change in RX, I will take care of that.  Could you write pt's response in phone note?   Thanks.  MSM ----- Message -----    From: Michele Mcalpine, RN    Sent: 02/06/2015   4:59 PM      To: Megan Salon, MD Subject: RE: options instead of vesicare                Dr. Sabra Heck,  Cheaper options for her would be Enablex, Ditropan XL and Detrol LA (all generic) (tier 1)  Vesicare is tier 2 for her. Myrbetriq is tier 2  ----- Message -----    From: Megan Salon, MD    Sent: 02/06/2015   1:36 PM      To: Trellis Paganini Rosalea Withrow, RN Subject: options instead of vesicare                    Can you check this pt's plan and see if there is a cheaper medication for her overactive bladder symptoms?  She is on Vesicare right now.  Thanks.

## 2015-02-07 NOTE — Telephone Encounter (Signed)
Message left to return call to Siyah Mault at [phone].    

## 2015-02-08 NOTE — Telephone Encounter (Signed)
Patient returned call and she is given message from Dr. Sabra Heck.  Patient calendar year plan restarts in July with new changes and plan. She would like to wait until July to see what is covered as she states that Vesicare will move to tier 3 instead of tier two. Patient unsure if she is is currently using a savings card. Cost is $30.00 per month for Vesicare at this time.   Patient declines to change to another prescription that may cause side effects for her.  Advised patient certainly understand this,however, if has side effects, can stop the medication and can request that Vesicare be covered for her at lower tier once she has tried formulary alternatives. Patient expresses frustration at this and need to "jump through hoops." Advised since covered, if wants to continue Vesicare without trying alternative will need to pay higher tier cost.   Patient states she will do some research with pharmacy and insurance company and call back with her decisions.  Routing to provider for final review. Patient agreeable to disposition. Will close encounter.

## 2015-03-16 ENCOUNTER — Telehealth: Payer: Self-pay | Admitting: *Deleted

## 2015-03-16 NOTE — Telephone Encounter (Signed)
Spoke with patient. Advised PA was sent today 03/16/2015 to [facility] for Vesicare 5 mg tablets. Advised can take 48-72 hours to receive response from [place]. Advised once we have received approval or denial we will contact her directly to let her know. Patient is agreeable and verbalizes understanding.

## 2015-03-16 NOTE — Telephone Encounter (Signed)
Patient called back and states she needs PO done ASAP.

## 2015-03-16 NOTE — Telephone Encounter (Signed)
Patient called. States she needs a prior authorization for her Vesicare.

## 2015-03-20 NOTE — Telephone Encounter (Signed)
PA for Vesicare 5 mg tablets has been denied by [employer]. Per review of formulary covered alternatives include Enablex 15 mg, Detrol LA 4 mg, Ditropan XL 10 mg, or Myrbetriq 25 mg. Routing to [place] for review.

## 2015-03-20 NOTE — Telephone Encounter (Signed)
Let's try Myrbetriq 25mg  daily.  It does not any of the dry eyes, dry mouth, constipation side effects.  Thanks.

## 2015-03-21 MED ORDER — MIRABEGRON ER 25 MG PO TB24: 25.0000 mg | ORAL_TABLET | Freq: Every day | ORAL | Status: DC

## 2015-03-21 NOTE — Telephone Encounter (Signed)
Spoke with patient. Patient states that she is at the pharmacy and Kara Mitchell is not covered with her insurance. Spoke with pharmacist Claiborne Billings who states that Ditropan XL would be covered with her insurance. Advised will need to speak with Dr.Miller regarding change and dosage and return call. Pharmacist is agreeable. Spoke with patient again. Advised of recommendation from pharmacist and that I will speak with Dr.Miller and return call. Patient is agreeable.

## 2015-03-21 NOTE — Telephone Encounter (Signed)
Spoke with patient. Advised of message as seen below from [place]. Patient is agreeable. Rx for Myrbetriq 25 mg daily sent to pharmacy on file. Patient would like to speak with her insurance regarding coverage and will return call if she needs anything further.  Routing to provider for final review. Patient agreeable to disposition. Will close encounter.   Patient aware provider will review message and nurse will return call if any additional advice or change of disposition.

## 2015-03-21 NOTE — Telephone Encounter (Signed)
Left message to call [name] at [phone]. 

## 2015-03-23 MED ORDER — OXYBUTYNIN CHLORIDE ER 10 MG PO TB24: 10.0000 mg | ORAL_TABLET | Freq: Every day | ORAL | Status: DC

## 2015-03-23 NOTE — Telephone Encounter (Signed)
Ditropan XL 10mg  daily is fine.  Needs to watch for dry eyes, dry mouth, increased constipation.

## 2015-03-23 NOTE — Telephone Encounter (Signed)
Left message to call [name] at [phone]. 

## 2015-03-23 NOTE — Addendum Note (Signed)
Addended by: Jasmine Awe on: 03/23/2015 09:06 AM   Modules accepted: Orders, Medications

## 2015-03-24 NOTE — Addendum Note (Signed)
Addended by: Jasmine Awe on: 03/24/2015 03:47 PM   Modules accepted: Orders, Medications

## 2015-03-24 NOTE — Telephone Encounter (Signed)
Spoke with patient. Advised of message as seen below from [place]. Patient is agreeable and states she pick up rx for Ditropan XL 10 mg and it was $10. Advised I will let Dr.Miller know and if she has any questions or concerns to return call. Patient is agreeable.  Routing to provider for final review. Patient agreeable to disposition. Will close encounter.

## 2015-05-03 ENCOUNTER — Telehealth: Payer: Self-pay | Admitting: Emergency Medicine

## 2015-05-03 NOTE — Telephone Encounter (Signed)
Received incoming call from [place] at [facility].  She is requesting that our office complete another prior authorization for Myrbetriq 25 mg for this patient. I reviewed notes and advised that our office has not completed a prior prior authorization for this patient for Mybetriq and at last note from Triage nurse, patient was to change to Ditropan 10 mg XL as it is a formulary alternative for patient.   York Cerise states that Myrbetriq was authorized by [employer] and was dispensed for patient 03/29/15 and they have documentation that a prior authorization for myrbetriq was received. I advised York Cerise that our office did not complete a prior authorization for Myrbetriq, only Vesicare, which was declined as patient has not tried and failed at least one formulary alternative. She is requesting that a new prior authorization be sent for patient. Advised I will contact patient and provider to discuss.   Called patient, Kara Mitchell. She confirms that she was advised of change to Ditropan XL 10 mg as is on her formulary. She states she picked up the prescription but then was contacted back by [facility] to advise that Myrbetriq had been covered and patient went back to [facility] to pick up Myrbetriq and started on 03/29/15. Now prescription is requiring a prior authorization. Advised patient likely [employer] covered one month only of medication as "transitional fill" as courtesy to her. If requiring prior authorization again, will be same as vesicare and likely not approved as has not had failure to medication. Patient expresses that since her insurance company approved it once, then they should approve it again.   Asked about how Myrbetriq is working for patient. She states that feelings of urge are not improved with Myrbetriq and can really tell the difference between how Myrbetriq does not work as well for her as Retail buyer did. Can have urinary incontinence if not near toilet.   Advised patient  that if Myrbetriq is not working well for her, possibly should not attempt to obtain coverage for it and see if since not working as well and OAB symptoms are not improved with using it. Patient agrees to this. Advised will try for Authorization of Vesicare again for her, since she has used this in the past and it worked well for her, insurance requires use and failure of one formulary alternative prior to covering vesicare but myrbetriq is not on the list of formulary alternatives. Advised would submit request for coverage again with Vesicare, with failure to Myrbetriq and see if covered. Patient agreeable.   Update to Dr. Sabra Heck to review.   Prior authorization submitted via covermymeds.com with Key ID [id number] for Vesicare 5 mg, failure to Myrbetriq 25 mg.

## 2015-05-03 NOTE — Telephone Encounter (Signed)
Thank you for all the time and documentation working on this for mrs. Blomquist.

## 2015-05-04 NOTE — Telephone Encounter (Signed)
Received fax from [facility] requesting prior authorization for [employer]. [place]. Asked to hold on filling Myrbetriq for now, advised new prior authorization for Vesicare requested.

## 2015-05-08 NOTE — Telephone Encounter (Signed)
Received denial for Vesicare.  Patient has not tried and failed one medication on her formulary.  Advised patient.  She is agreeable. She will try Ditropan 10 mg XL. She is aware of potential side effects and will call if experiences any side effects.   Call to [facility], spoke with Debroah Baller. Advised Myrbetriq will not be covered. Requested they fill prescription for Ditropan 10 mg XL PO. They will fill order for patient.   Routing to provider for final review. Patient agreeable to disposition. Will close encounter.

## 2015-12-13 ENCOUNTER — Other Ambulatory Visit: Payer: Self-pay | Admitting: Orthopedic Surgery

## 2015-12-14 ENCOUNTER — Encounter (HOSPITAL_BASED_OUTPATIENT_CLINIC_OR_DEPARTMENT_OTHER): Payer: Self-pay | Admitting: *Deleted

## 2015-12-21 ENCOUNTER — Encounter (HOSPITAL_BASED_OUTPATIENT_CLINIC_OR_DEPARTMENT_OTHER): Payer: Self-pay | Admitting: *Deleted

## 2015-12-21 ENCOUNTER — Ambulatory Visit (HOSPITAL_BASED_OUTPATIENT_CLINIC_OR_DEPARTMENT_OTHER): Payer: BLUE CROSS/BLUE SHIELD | Admitting: Anesthesiology

## 2015-12-21 ENCOUNTER — Encounter (HOSPITAL_BASED_OUTPATIENT_CLINIC_OR_DEPARTMENT_OTHER)
Admission: RE | Disposition: A | Discharge status: Home / Self Care | Payer: Self-pay | Source: Ambulatory Visit | Attending: Orthopedic Surgery

## 2015-12-21 ENCOUNTER — Ambulatory Visit (HOSPITAL_BASED_OUTPATIENT_CLINIC_OR_DEPARTMENT_OTHER)
Admission: RE | Admit: 2015-12-21 | Discharge: 2015-12-21 | Disposition: A | Discharge status: Home / Self Care | Payer: BLUE CROSS/BLUE SHIELD | Source: Ambulatory Visit | Attending: Orthopedic Surgery | Admitting: Orthopedic Surgery

## 2015-12-21 DIAGNOSIS — Z79899 Other long term (current) drug therapy: Secondary | ICD-10-CM | POA: Diagnosis not present

## 2015-12-21 DIAGNOSIS — M25841 Other specified joint disorders, right hand: Secondary | ICD-10-CM | POA: Insufficient documentation

## 2015-12-21 DIAGNOSIS — R32 Unspecified urinary incontinence: Secondary | ICD-10-CM | POA: Insufficient documentation

## 2015-12-21 DIAGNOSIS — M71341 Other bursal cyst, right hand: Secondary | ICD-10-CM | POA: Insufficient documentation

## 2015-12-21 DIAGNOSIS — M19041 Primary osteoarthritis, right hand: Secondary | ICD-10-CM | POA: Insufficient documentation

## 2015-12-21 DIAGNOSIS — Z87891 Personal history of nicotine dependence: Secondary | ICD-10-CM | POA: Insufficient documentation

## 2015-12-21 DIAGNOSIS — E039 Hypothyroidism, unspecified: Secondary | ICD-10-CM | POA: Diagnosis not present

## 2015-12-21 DIAGNOSIS — Z791 Long term (current) use of non-steroidal anti-inflammatories (NSAID): Secondary | ICD-10-CM | POA: Diagnosis not present

## 2015-12-21 HISTORY — PX: MASS EXCISION: SHX2000

## 2015-12-21 HISTORY — DX: Unspecified osteoarthritis, unspecified site: M19.90

## 2015-12-21 HISTORY — DX: Hypothyroidism, unspecified: E03.9

## 2015-12-21 HISTORY — PX: DISTAL INTERPHALANGEAL JOINT FUSION: SHX6428

## 2015-12-21 SURGERY — EXCISION MASS
Anesthesia: Monitor Anesthesia Care | Site: Finger | Laterality: Right

## 2015-12-21 MED ORDER — BUPIVACAINE HCL (PF) 0.25 % IJ SOLN
INTRAMUSCULAR | Status: DC | PRN
  Administered 2015-12-21: 10 mL

## 2015-12-21 MED ORDER — ONDANSETRON HCL 4 MG/2ML IJ SOLN: 4.0000 mg | Freq: Once | INTRAMUSCULAR | Status: DC | PRN

## 2015-12-21 MED ORDER — PROPOFOL 10 MG/ML IV BOLUS
INTRAVENOUS | Status: AC
  Filled 2015-12-21: qty 20

## 2015-12-21 MED ORDER — GLYCOPYRROLATE 0.2 MG/ML IJ SOLN
0.2000 mg | Freq: Once | INTRAMUSCULAR | Status: DC | PRN
Start: 2015-12-21 — End: 2015-12-21

## 2015-12-21 MED ORDER — HYDROMORPHONE HCL 1 MG/ML IJ SOLN: 0.2500 mg | INTRAMUSCULAR | Status: DC | PRN

## 2015-12-21 MED ORDER — OXYCODONE HCL 5 MG/5ML PO SOLN: 5.0000 mg | Freq: Once | ORAL | Status: DC | PRN

## 2015-12-21 MED ORDER — CEFAZOLIN SODIUM-DEXTROSE 2-4 GM/100ML-% IV SOLN
2.0000 g | INTRAVENOUS | Status: AC
  Administered 2015-12-21: 2 g via INTRAVENOUS

## 2015-12-21 MED ORDER — HYDROCODONE-ACETAMINOPHEN 5-325 MG PO TABS: ORAL_TABLET | ORAL | Status: DC

## 2015-12-21 MED ORDER — MIDAZOLAM HCL 2 MG/2ML IJ SOLN
INTRAMUSCULAR | Status: AC
  Filled 2015-12-21: qty 2

## 2015-12-21 MED ORDER — FENTANYL CITRATE (PF) 100 MCG/2ML IJ SOLN
INTRAMUSCULAR | Status: AC
  Filled 2015-12-21: qty 2

## 2015-12-21 MED ORDER — CEFAZOLIN SODIUM-DEXTROSE 2-4 GM/100ML-% IV SOLN
INTRAVENOUS | Status: AC
  Filled 2015-12-21: qty 100

## 2015-12-21 MED ORDER — SCOPOLAMINE 1 MG/3DAYS TD PT72: 1.0000 | MEDICATED_PATCH | Freq: Once | TRANSDERMAL | Status: DC | PRN

## 2015-12-21 MED ORDER — OXYCODONE HCL 5 MG PO TABS: 5.0000 mg | ORAL_TABLET | Freq: Once | ORAL | Status: DC | PRN

## 2015-12-21 MED ORDER — LIDOCAINE HCL (PF) 0.5 % IJ SOLN
INTRAMUSCULAR | Status: DC | PRN
  Administered 2015-12-21: 30 mL via INTRAVENOUS

## 2015-12-21 MED ORDER — LACTATED RINGERS IV SOLN
INTRAVENOUS | Status: DC
  Administered 2015-12-21: 09:00:00 via INTRAVENOUS

## 2015-12-21 MED ORDER — CHLORHEXIDINE GLUCONATE 4 % EX LIQD: 60.0000 mL | Freq: Once | CUTANEOUS | Status: DC

## 2015-12-21 MED ORDER — ONDANSETRON HCL 4 MG/2ML IJ SOLN
INTRAMUSCULAR | Status: DC | PRN
Start: 2015-12-21 — End: 2015-12-21
  Administered 2015-12-21: 4 mg via INTRAVENOUS

## 2015-12-21 MED ORDER — PROPOFOL 10 MG/ML IV BOLUS
INTRAVENOUS | Status: DC | PRN
  Administered 2015-12-21: 20 mg via INTRAVENOUS
  Administered 2015-12-21: 40 mg via INTRAVENOUS
  Administered 2015-12-21 (×2): 20 mg via INTRAVENOUS

## 2015-12-21 SURGICAL SUPPLY — 61 items
APL SKNCLS STERI-STRIP NONHPOA (GAUZE/BANDAGES/DRESSINGS)
BANDAGE ACE 3X5.8 VEL STRL LF (GAUZE/BANDAGES/DRESSINGS) IMPLANT
BANDAGE COBAN STERILE 2 (GAUZE/BANDAGES/DRESSINGS) IMPLANT
BENZOIN TINCTURE PRP APPL 2/3 (GAUZE/BANDAGES/DRESSINGS) IMPLANT
BLADE MINI RND TIP GREEN BEAV (BLADE) IMPLANT
BLADE SURG 15 STRL LF DISP TIS (BLADE) ×2 IMPLANT
BLADE SURG 15 STRL SS (BLADE) ×4
BNDG CMPR 9X4 STRL LF SNTH (GAUZE/BANDAGES/DRESSINGS) ×1
BNDG COHESIVE 1X5 TAN STRL LF (GAUZE/BANDAGES/DRESSINGS) ×1 IMPLANT
BNDG CONFORM 2 STRL LF (GAUZE/BANDAGES/DRESSINGS) IMPLANT
BNDG ELASTIC 2X5.8 VLCR STR LF (GAUZE/BANDAGES/DRESSINGS) IMPLANT
BNDG ESMARK 4X9 LF (GAUZE/BANDAGES/DRESSINGS) ×1 IMPLANT
BNDG GAUZE 1X2.1 STRL (MISCELLANEOUS) IMPLANT
BNDG GAUZE ELAST 4 BULKY (GAUZE/BANDAGES/DRESSINGS) IMPLANT
BNDG PLASTER X FAST 3X3 WHT LF (CAST SUPPLIES) IMPLANT
BNDG PLSTR 9X3 FST ST WHT (CAST SUPPLIES)
CHLORAPREP W/TINT 26ML (MISCELLANEOUS) ×2 IMPLANT
CORDS BIPOLAR (ELECTRODE) ×2 IMPLANT
COVER BACK TABLE 60X90IN (DRAPES) ×2 IMPLANT
COVER MAYO STAND STRL (DRAPES) ×2 IMPLANT
CUFF TOURNIQUET SINGLE 18IN (TOURNIQUET CUFF) ×2 IMPLANT
DRAPE EXTREMITY T 121X128X90 (DRAPE) ×2 IMPLANT
DRAPE SURG 17X23 STRL (DRAPES) ×2 IMPLANT
GAUZE SPONGE 4X4 12PLY STRL (GAUZE/BANDAGES/DRESSINGS) ×2 IMPLANT
GAUZE XEROFORM 1X8 LF (GAUZE/BANDAGES/DRESSINGS) ×2 IMPLANT
GLOVE BIO SURGEON STRL SZ7.5 (GLOVE) ×2 IMPLANT
GLOVE BIOGEL PI IND STRL 7.0 (GLOVE) IMPLANT
GLOVE BIOGEL PI IND STRL 8 (GLOVE) ×1 IMPLANT
GLOVE BIOGEL PI INDICATOR 7.0 (GLOVE) ×1
GLOVE BIOGEL PI INDICATOR 8 (GLOVE) ×2
GLOVE SURG SS PI 6.5 STRL IVOR (GLOVE) ×1 IMPLANT
GLOVE SURG SYN 7.5  E (GLOVE) ×1
GLOVE SURG SYN 7.5 E (GLOVE) ×1 IMPLANT
GLOVE SURG SYN 7.5 PF PI (GLOVE) IMPLANT
GOWN STRL REUS W/ TWL LRG LVL3 (GOWN DISPOSABLE) ×1 IMPLANT
GOWN STRL REUS W/TWL LRG LVL3 (GOWN DISPOSABLE) ×2
GOWN STRL REUS W/TWL XL LVL3 (GOWN DISPOSABLE) ×2 IMPLANT
NDL BLD DRAW 23GX1  MC LAB (NEEDLE) IMPLANT
NDL HYPO 25X1 1.5 SAFETY (NEEDLE) ×1 IMPLANT
NEEDLE BLD DRAW 23GX1   MC LAB (NEEDLE) ×1
NEEDLE BLD DRAW 23GX1  MC LAB (NEEDLE) ×1 IMPLANT
NEEDLE HYPO 25X1 1.5 SAFETY (NEEDLE) ×2 IMPLANT
NS IRRIG 1000ML POUR BTL (IV SOLUTION) ×2 IMPLANT
PACK BASIN DAY SURGERY FS (CUSTOM PROCEDURE TRAY) ×2 IMPLANT
PAD CAST 3X4 CTTN HI CHSV (CAST SUPPLIES) IMPLANT
PAD CAST 4YDX4 CTTN HI CHSV (CAST SUPPLIES) IMPLANT
PADDING CAST ABS 4INX4YD NS (CAST SUPPLIES)
PADDING CAST ABS COTTON 4X4 ST (CAST SUPPLIES) ×1 IMPLANT
PADDING CAST COTTON 3X4 STRL (CAST SUPPLIES)
PADDING CAST COTTON 4X4 STRL (CAST SUPPLIES)
STOCKINETTE 4X48 STRL (DRAPES) ×2 IMPLANT
STRIP CLOSURE SKIN 1/2X4 (GAUZE/BANDAGES/DRESSINGS) IMPLANT
SUT ETHILON 3 0 PS 1 (SUTURE) IMPLANT
SUT ETHILON 4 0 PS 2 18 (SUTURE) ×2 IMPLANT
SUT ETHILON 5 0 P 3 18 (SUTURE)
SUT NYLON ETHILON 5-0 P-3 1X18 (SUTURE) IMPLANT
SUT VIC AB 4-0 P2 18 (SUTURE) IMPLANT
SYR BULB 3OZ (MISCELLANEOUS) ×2 IMPLANT
SYR CONTROL 10ML LL (SYRINGE) ×2 IMPLANT
TOWEL OR 17X24 6PK STRL BLUE (TOWEL DISPOSABLE) ×4 IMPLANT
UNDERPAD 30X30 (UNDERPADS AND DIAPERS) ×2 IMPLANT

## 2015-12-21 NOTE — Transfer of Care (Signed)
Immediate Anesthesia Transfer of Care Note  Patient: Kara Mitchell  Procedure(s) Performed: Procedure(s): RIGHT INDEX EXCISION MASS (Right) DEBRIDEMENT OF RIGHT DISTAL INTERPHALANGEAL JOINT  (Right)  Patient Location: PACU  Anesthesia Type:Bier block  Level of Consciousness: awake  Airway & Oxygen Therapy: Patient Spontanous Breathing and Patient connected to face mask oxygen  Post-op Assessment: Report given to RN and Post -op Vital signs reviewed and stable  Post vital signs: Reviewed and stable  Last Vitals:  Filed Vitals:   12/21/15 1127 12/21/15 1129  BP:    Pulse: 89 85  Temp:    Resp:  16    Last Pain: There were no vitals filed for this visit.    Patients Stated Pain Goal: 0 ([id number])  Complications: No apparent anesthesia complications

## 2015-12-21 NOTE — H&P (Signed)
Kara Mitchell is an 65 y.o. female.   Chief Complaint: right index mucoid cyst and dip arthrosis HPI: 65 yo rhd female with 1.5 months of mass on dorsum right index finger at dip joint.  It is bothersome to her.  She wishes to have it removed and the joint debrided to try to prevent recurrence.  Allergies:  Allergies  Allergen Reactions  . Fentanyl Other (See Comments)    Low blood pressure  . Neosporin [Neomycin-Bacitracin Zn-Polymyx] Other (See Comments)    whelps  . Polysporin [Bacitracin-Polymyxin B] Other (See Comments)    whelps  . Tetanus Toxoids Other (See Comments)    fainting  . Versed [Midazolam] Other (See Comments)    Low blood pressure    Past Medical History  Diagnosis Date  . Thyroid disease   . Hiatal hernia   . Urinary incontinence   . Rosacea   . Strabismus   . Lung nodule     right, lower-seen on CT, had second CT 01/06/14-followed by Dr Roxan Hockey  . Complication of anesthesia     no fent/versed  . PONV (postoperative nausea and vomiting)   . Arthritis   . Hypothyroidism     Past Surgical History  Procedure Laterality Date  . Cystocele repair  2007  . Enterocele repair  2010  . Tonsillectomy and adenoidectomy  1974  . Knee surgery      x2-lt  . Breast biopsy  1995  . Other surgical history  2001    HNP L4-5  . Esophageal dilation  2004;2007  . Other surgical history      HNP repair- Earle Gell  . Abdominal hysterectomy    . Blepharoplasty  10/2012  . Cosmetic surgery      blethoplasty-bilat  . Vein ligation and stripping  2009  . Breast biopsy  1/95  . Back surgery  2012    lumb lam  . Trigger finger release Right 02/18/2014    Procedure: RELEASE A-1 PULLEY RIGHT THUMB/POSSIBLE INJ TO LEFT THUMB;  Surgeon: Cammie Sickle, MD;  Location: [place];  Service: Orthopedics;  Laterality: Right;    Family History: Family History  Problem Relation Age of Onset  . Adopted: Yes  . Cancer       NO FAMILY HX    Social  History:   reports that she quit smoking about 22 years ago. Her smoking use included Cigarettes. She has a 37.5 pack-year smoking history. She has never used smokeless tobacco. She reports that she drinks alcohol. She reports that she does not use illicit drugs.  Medications: Medications Prior to Admission  Medication Sig Dispense Refill  . meloxicam (MOBIC) 15 MG tablet Take 15 mg by mouth daily.    . metroNIDAZOLE (METROGEL) 1 % gel   2  . oxybutynin (DITROPAN XL) 10 MG 24 hr tablet Take 1 tablet (10 mg total) by mouth at bedtime. 30 tablet 12  . pantoprazole (PROTONIX) 40 MG tablet Take 40 mg by mouth daily.    Marland Kitchen thyroid (ARMOUR THYROID) 30 MG tablet Take 30 mg by mouth daily.    . Vitamin D, Ergocalciferol, (DRISDOL) 50000 UNITS CAPS capsule take 1 capsule by mouth every week 4 capsule 8    No results found for this or any previous visit (from the past 48 hour(s)).  No results found.   A comprehensive review of systems was negative.  Blood pressure 161/83, pulse 65, temperature 97.8 F (36.6 C), temperature source Oral, resp.  rate 18, height 5\' 5"  (1.651 m), weight 75.751 kg (167 lb), last menstrual period 08/26/2001, SpO2 97 %.  General appearance: alert, cooperative and appears stated age Head: Normocephalic, without obvious abnormality, atraumatic Neck: supple, symmetrical, trachea midline Resp: clear to auscultation bilaterally Cardio: regular rate and rhythm GI: non-tender Extremities: Intact sensation and capillary refill all digits.  +epl/fpl/io.  No wounds.  Pulses: 2+ and symmetric Skin: Skin color, texture, turgor normal. No rashes or lesions Neurologic: Grossly normal Incision/Wound:none  Assessment/Plan Right index finger mucoid cyst and dip joint arthritis.  Plan removal mucoid cyst and debridement dip joint.  Non operative and operative treatment options were discussed with the patient and patient wishes to proceed with operative treatment. Risks, benefits, and  alternatives of surgery were discussed and the patient agrees with the plan of care.   Lajune Perine R 12/21/2015, 10:31 AM

## 2015-12-21 NOTE — Anesthesia Preprocedure Evaluation (Addendum)
Anesthesia Evaluation  Patient identified by MRN, date of birth, ID band Patient awake  General Assessment Comment:canoot take fent/versed  Reviewed: Allergy & Precautions, H&P , NPO status , Patient's Chart, lab work & pertinent test results  History of Anesthesia Complications (+) PONV and history of anesthetic complications  Airway Mallampati: I   Neck ROM: Full    Dental  (+) Teeth Intact   Pulmonary neg pulmonary ROS, former smoker,    breath sounds clear to auscultation       Cardiovascular negative cardio ROS   Rhythm:Regular Rate:Normal     Neuro/Psych    GI/Hepatic Neg liver ROS, hiatal hernia,   Endo/Other  negative endocrine ROS  Renal/GU negative Renal ROS     Musculoskeletal   Abdominal   Peds  Hematology   Anesthesia Other Findings   Reproductive/Obstetrics                            Anesthesia Physical  Anesthesia Plan  ASA: II  Anesthesia Plan: Bier Block and MAC   Post-op Pain Management:    Induction: Intravenous  Airway Management Planned:   Additional Equipment:   Intra-op Plan:   Post-operative Plan:   Informed Consent: I have reviewed the patients History and Physical, chart, labs and discussed the procedure including the risks, benefits and alternatives for the proposed anesthesia with the patient or authorized representative who has indicated his/her understanding and acceptance.     Plan Discussed with: Anesthesiologist and CRNA  Anesthesia Plan Comments: (Patient adamantly does not want midazolam and fentanyl due to hypotensive episode with GI procedure. We will honor this request and do propofol only anesthetic)       Anesthesia Quick Evaluation

## 2015-12-21 NOTE — Brief Op Note (Signed)
12/21/2015  11:21 AM  PATIENT:  Kara Mitchell  65 y.o. female  PRE-OPERATIVE DIAGNOSIS:  RIGHT INDEX MUCOID CYST, DIP ARTHROSIS  POST-OPERATIVE DIAGNOSIS:  RIGHT INDEX MUCOID CYST, DIP ARTHROSIS  PROCEDURE:  Procedure(s): RIGHT INDEX EXCISION MASS (Right) DEBRIDEMENT OF RIGHT DISTAL INTERPHALANGEAL JOINT  (Right)  SURGEON:  Surgeon(s) and Role:    * Leanora Cover, MD - Primary  PHYSICIAN ASSISTANT:   ASSISTANTS: none   ANESTHESIA:   Bier block with sedation  EBL:  Total I/O In: -  Out: 5 [Blood:5]  BLOOD ADMINISTERED:none  DRAINS: none   LOCAL MEDICATIONS USED:  MARCAINE     SPECIMEN:  Source of Specimen:  right index finger  DISPOSITION OF SPECIMEN:  PATHOLOGY  COUNTS:  YES  TOURNIQUET:   Total Tourniquet Time Documented: Forearm (Right) - 25 minutes Total: Forearm (Right) - 25 minutes   DICTATION: .Other Dictation: Dictation Number [id number]  PLAN OF CARE: Discharge to home after PACU  PATIENT DISPOSITION:  PACU - hemodynamically stable.

## 2015-12-21 NOTE — Discharge Instructions (Addendum)

## 2015-12-21 NOTE — Op Note (Signed)
[id number] 

## 2015-12-21 NOTE — Anesthesia Postprocedure Evaluation (Signed)
Anesthesia Post Note  Patient: Kara Mitchell  Procedure(s) Performed: Procedure(s) (LRB): RIGHT INDEX EXCISION MASS (Right) DEBRIDEMENT OF RIGHT DISTAL INTERPHALANGEAL JOINT  (Right)  Patient location during evaluation: PACU Anesthesia Type: MAC and Bier Block Level of consciousness: awake and alert Pain management: pain level controlled Vital Signs Assessment: post-procedure vital signs reviewed and stable Respiratory status: spontaneous breathing, nonlabored ventilation, respiratory function stable and patient connected to nasal cannula oxygen Cardiovascular status: stable and blood pressure returned to baseline Anesthetic complications: no    Last Vitals:  Filed Vitals:   12/21/15 1127 12/21/15 1129  BP:    Pulse: 89 85  Temp:    Resp:  16    Last Pain: There were no vitals filed for this visit.               Zenaida Deed

## 2015-12-21 NOTE — Op Note (Signed)
NAMEAURELIANA, WASSMANN                ACCOUNT NO.:  [id number]  MEDICAL RECORD NO.:  [id number]  LOCATION:                                 FACILITY:  PHYSICIAN:  Leanora Cover, MD             DATE OF BIRTH:  DATE OF PROCEDURE:  12/21/2015 DATE OF DISCHARGE:                              OPERATIVE REPORT   PREOPERATIVE DIAGNOSIS:  Right index finger mucoid cyst and distal interphalangeal joint arthrosis.  POSTOPERATIVE DIAGNOSIS:  Right index finger mucoid cyst and distal interphalangeal joint arthrosis.  PROCEDURE:   1. Right index finger excision of mass 2. Debridement of distal interphalangeal joint.  SURGEON:  Leanora Cover, MD  ASSISTANT:  None.  ANESTHESIA:  Bier block with sedation.  IV FLUIDS:  Per anesthesia flow sheet.  ESTIMATED BLOOD LOSS:  Minimal.  COMPLICATIONS:  None.  SPECIMENS:  Right index finger mass to Pathology.  TOURNIQUET TIME:  25 minutes.  DISPOSITION:  Stable to PACU.  INDICATIONS:  Ms. Seabourn is a 65 year old female with a mass on the right index finger for approximately 1-1/2 months.  This is bothersome to her.  She also has some pain at the DIP joint.  She wishes to have the mass excised and the DIP joint debrided to try and prevent recurrence.  Risks, benefits and alternatives of the surgery were discussed including the risk of blood loss; infection; damage to nerves, vessels, tendons, ligaments, bone; failure of surgery; need for additional surgery; complications with wound healing; continued pain; and recurrence of mass.  She voiced understanding of these risks and elected to proceed.  OPERATIVE COURSE:  After being identified preoperatively by myself, the patient and I agreed upon the procedure and site of procedure.  Surgical site was marked.  The risks, benefits, and alternatives of the surgery were reviewed and she wished to proceed.  Surgical consent had been signed.  She was given IV Ancef as preoperative antibiotic  prophylaxis. She was transferred to the operating room and placed on the operating table in supine position with the right upper extremity on an armboard. Bier block anesthesia was induced by anesthesiologist.  The right upper extremity was prepped and draped in normal sterile orthopedic fashion. A surgical pause was performed between the surgeons, anesthesia and operating room staff, and all were in agreement as to the patient, procedure, and site of procedure.  Tourniquet at the proximal aspect of the forearm had been inflated for the Bier block.  The anesthesia was augmented with a digital block with 10 mL of 0.25% plain Marcaine.  A hockey-stick shaped incision was made at the DIP joint of the index finger.  This was carried into subcutaneous tissues by spreading technique.  Bipolar electrocautery was used to obtain hemostasis.  The mass was identified and removed with the rongeurs.  There was an additional small cyst at the ulnar side of the joint, which was removed also.  The joint was entered underneath the tendon and the joint debrided and synovectomy performed.  The wound was copiously irrigated with sterile saline.  It was then closed with 4-0 nylon in a horizontal mattress fashion.  It was dressed  with sterile Xeroform, 4x4s, and wrapped with a Coban dressing lightly.  An AlumaFoam splint was placed and wrapped lightly with a Coban dressing.  The tourniquet was deflated at 25 minutes.  Fingertips were pink with brisk capillary refill after deflation of the tourniquet.  The mass was sent to Pathology for examination.  The operative drapes were broken down.  The patient was awoken from anesthesia safely.  She was transferred back to the stretcher and taken to PACU in stable condition.  I will see her back in the office in 1 week for postoperative followup.  I will give her Norco 5/325, 1-2 p.o. q.6 hours p.r.n. pain, dispensed #20.     Leanora Cover, MD     KK/MEDQ  D:   12/21/2015  T:  12/21/2015  Job:  [id number]

## 2015-12-22 ENCOUNTER — Encounter (HOSPITAL_BASED_OUTPATIENT_CLINIC_OR_DEPARTMENT_OTHER): Payer: Self-pay | Admitting: Orthopedic Surgery

## 2016-01-29 DIAGNOSIS — M19041 Primary osteoarthritis, right hand: Secondary | ICD-10-CM | POA: Insufficient documentation

## 2016-02-23 ENCOUNTER — Encounter: Payer: Self-pay | Admitting: Obstetrics & Gynecology

## 2016-02-23 ENCOUNTER — Ambulatory Visit (INDEPENDENT_AMBULATORY_CARE_PROVIDER_SITE_OTHER): Payer: BLUE CROSS/BLUE SHIELD | Admitting: Obstetrics & Gynecology

## 2016-02-23 VITALS — BP 128/76 | HR 74 | Resp 14 | Ht 63.5 in | Wt 167.0 lb

## 2016-02-23 DIAGNOSIS — Z0000 Encounter for general adult medical examination without abnormal findings: Secondary | ICD-10-CM

## 2016-02-23 DIAGNOSIS — Z205 Contact with and (suspected) exposure to viral hepatitis: Secondary | ICD-10-CM

## 2016-02-23 DIAGNOSIS — Z124 Encounter for screening for malignant neoplasm of cervix: Secondary | ICD-10-CM

## 2016-02-23 DIAGNOSIS — IMO0002 Reserved for concepts with insufficient information to code with codable children: Secondary | ICD-10-CM

## 2016-02-23 DIAGNOSIS — Z01419 Encounter for gynecological examination (general) (routine) without abnormal findings: Secondary | ICD-10-CM

## 2016-02-23 DIAGNOSIS — N8110 Cystocele, unspecified: Secondary | ICD-10-CM

## 2016-02-23 LAB — POCT URINALYSIS DIPSTICK
BILIRUBIN UA: NEGATIVE
Glucose, UA: NEGATIVE
Ketones, UA: NEGATIVE
NITRITE UA: NEGATIVE
PH UA: 7
Protein, UA: NEGATIVE
RBC UA: 50
UROBILINOGEN UA: NEGATIVE

## 2016-02-23 MED ORDER — OXYBUTYNIN CHLORIDE ER 10 MG PO TB24: 10.0000 mg | ORAL_TABLET | Freq: Every day | ORAL | Status: DC

## 2016-02-23 NOTE — Progress Notes (Signed)
65 y.o. [id number] DivorcedCaucasianF here for annual exam.  Doing well.  No vaginal bleeding.  Upcoming cataract surgery in August.    PCP:  Dr. Forde Dandy.  Did blood work at last visit.    Patient's last menstrual period was 08/26/2001.          Sexually active: No.  The current method of family planning is status post hysterectomy.    Exercising: No.  The patient does not participate in regular exercise at present. Smoker:  no  Health Maintenance: Pap: 01/10/14 WNL History of abnormal Pap:  no MMG: 06/28/15 BIRADS1 Colonoscopy: 2014-repeat 2017 with Dr. Earlean Shawl (patient adopted, no family history) BMD:   06/02/14-Osteopenia TDaP: 04/2009 Pneumonia vaccine:  Not done Shingles vaccine:  Not done Screening Labs: PCP, Hb today: PCP, Urine today: Trace Leuko, 50 Blood.    reports that she quit smoking about 23 years ago. Her smoking use included Cigarettes. She has a 37.5 pack-year smoking history. She has never used smokeless tobacco. She reports that she drinks alcohol. She reports that she does not use illicit drugs.  Past Medical History  Diagnosis Date  . Thyroid disease   . Hiatal hernia   . Urinary incontinence   . Rosacea   . Strabismus   . Lung nodule     right, lower-seen on CT, had second CT 01/06/14-followed by Dr Roxan Hockey  . Complication of anesthesia     no fent/versed  . PONV (postoperative nausea and vomiting)   . Arthritis   . Hypothyroidism     Past Surgical History  Procedure Laterality Date  . Cystocele repair  2007  . Enterocele repair  2010  . Tonsillectomy and adenoidectomy  1974  . Knee surgery      x2-lt  . Breast biopsy  1995  . Other surgical history  2001    HNP L4-5  . Esophageal dilation  2004;2007  . Other surgical history      HNP repair- Earle Gell  . Abdominal hysterectomy    . Blepharoplasty  10/2012  . Cosmetic surgery      blethoplasty-bilat  . Vein ligation and stripping  2009  . Breast biopsy  1/95  . Back surgery  2012    lumb  lam  . Trigger finger release Right 02/18/2014    Procedure: RELEASE A-1 PULLEY RIGHT THUMB/POSSIBLE INJ TO LEFT THUMB;  Surgeon: Cammie Sickle, MD;  Location: [place];  Service: Orthopedics;  Laterality: Right;  . Mass excision Right 12/21/2015    Procedure: RIGHT INDEX EXCISION MASS;  Surgeon: Leanora Cover, MD;  Location: [place];  Service: Orthopedics;  Laterality: Right;  . Distal interphalangeal joint fusion Right 12/21/2015    Procedure: DEBRIDEMENT OF RIGHT DISTAL INTERPHALANGEAL JOINT ;  Surgeon: Leanora Cover, MD;  Location: [place];  Service: Orthopedics;  Laterality: Right;    Current Outpatient Prescriptions  Medication Sig Dispense Refill  . meloxicam (MOBIC) 15 MG tablet Take 15 mg by mouth daily.    . metroNIDAZOLE (METROGEL) 1 % gel   2  . oxybutynin (DITROPAN XL) 10 MG 24 hr tablet Take 1 tablet (10 mg total) by mouth at bedtime. 30 tablet 12  . pantoprazole (PROTONIX) 40 MG tablet Take 40 mg by mouth daily.    Marland Kitchen thyroid (ARMOUR THYROID) 30 MG tablet Take 30 mg by mouth daily.    . Vitamin D, Ergocalciferol, (DRISDOL) 50000 UNITS CAPS capsule take 1 capsule by mouth every week 4 capsule  8   No current facility-administered medications for this visit.    Family History  Problem Relation Age of Onset  . Adopted: Yes  . Cancer       NO FAMILY HX    ROS:  Pertinent items are noted in HPI.  Otherwise, a comprehensive ROS was negative.  Exam:   BP 128/76 mmHg  Pulse 74  Resp 14  Ht 5' 3.5" (1.613 m)  Wt 167 lb (75.751 kg)  BMI 29.12 kg/m2  LMP 08/26/2001  Weight change: @WEIGHTCHANGE @ Height:   Height: 5' 3.5" (161.3 cm)  Ht Readings from Last 3 Encounters:  02/23/16 5' 3.5" (1.613 m)  12/21/15 5\' 5"  (1.651 m)  02/06/15 5\' 4"  (1.626 m)    General appearance: alert, cooperative and appears stated age Head: Normocephalic, without obvious abnormality, atraumatic Neck: no adenopathy, supple, symmetrical, trachea  midline and thyroid normal to inspection and palpation Lungs: clear to auscultation bilaterally Breasts: normal appearance, no masses or tenderness Heart: regular rate and rhythm Abdomen: soft, non-tender; bowel sounds normal; no masses,  no organomegaly Extremities: extremities normal, atraumatic, no cyanosis or edema Skin: Skin color, texture, turgor normal. No rashes or lesions Lymph nodes: Cervical, supraclavicular, and axillary nodes normal. No abnormal inguinal nodes palpated Neurologic: Grossly normal   Pelvic: External genitalia:  no lesions              Urethra:  normal appearing urethra with no masses, tenderness or lesions              Bartholins and Skenes: normal                 Vagina: normal appearing vagina with normal color and discharge, no lesions, 3rd degree cystocele              Cervix: absent              Pap taken: Yes.   Bimanual Exam:  Uterus:  uterus absent              Adnexa: no mass, fullness, tenderness               Rectovaginal: Confirms               Anus:  normal sphincter tone, no lesions  Chaperone was present for exam.  A:  Well Woman with normal exam S/p TVH, anterior repair 3/09, then cystocele repair with mesh with Dr. Lawrence Santiago 2007 H/o use of #4 incontinence ring. Not using due to pain with insertion Grade 3 rectocele OAB  P: Mammogram yearly. Doing 3D yearly. Did have done 2015. Have called to get a copy from [place] Pap 2015.  No pap 2016.  Pap obtained this year. Labs/vaccines with Dr. Forde Dandy Oxybutin 10mg  XL #30/13 Hep C testing today Urine culture pending Return annually or prn

## 2016-02-24 LAB — HEPATITIS C ANTIBODY: HCV AB: NEGATIVE

## 2016-02-24 LAB — URINE CULTURE

## 2016-02-26 ENCOUNTER — Telehealth: Payer: Self-pay | Admitting: Obstetrics & Gynecology

## 2016-02-26 LAB — IPS PAP TEST WITH REFLEX TO HPV

## 2016-02-26 NOTE — Telephone Encounter (Signed)
Returned call. Office visit rescheduled to 03/04/16 with Dr. Sabra Heck and patient agreeable. Denies problems at this time.  Routing to provider for final review. Patient agreeable to disposition. Will close encounter.

## 2016-02-26 NOTE — Telephone Encounter (Signed)
Patient is asking to talk with Dr. Ammie Ferrier nurse Raquel Sarna. Patient has a question about the appointment that was scheduled for her 03/08/16 with Dr.Miller. Patient is asking if she could come in earlier and see a NP for this appointment or does she need to see Dr.Miller?

## 2016-03-04 ENCOUNTER — Ambulatory Visit (INDEPENDENT_AMBULATORY_CARE_PROVIDER_SITE_OTHER): Payer: BLUE CROSS/BLUE SHIELD | Admitting: Obstetrics & Gynecology

## 2016-03-04 ENCOUNTER — Encounter: Payer: Self-pay | Admitting: Obstetrics & Gynecology

## 2016-03-04 VITALS — BP 118/66 | HR 72 | Resp 14 | Wt 168.4 lb

## 2016-03-04 DIAGNOSIS — R3129 Other microscopic hematuria: Secondary | ICD-10-CM | POA: Diagnosis not present

## 2016-03-04 NOTE — Progress Notes (Signed)
GYNECOLOGY  VISIT   HPI: 65 y.o. [id number] Divorced Caucasian female with microscopic hematuria noted on urine when she was here for AEX.  Urine culture was negative.  Pt does have hx of cystocele but no recent issues.  Denies vaginal bleeding.  H/o hysterectomy.  Pt frustrated I did not do this at her last visit.  States "I'm back here again and I'm missing work when you could have done this at my last exam.".  D/W pt that microscpic hematuria is often negative with a micro as the dip is not as accurate.  D/w pt why micro tests are not done with each urine test.  Also, d/w pt improtance of evaluation and next steps if this is positive--urology referral and CT with cystoscopy.  Pt will want to see Dr. Amalia Hailey if needs referral.    GYNECOLOGIC HISTORY: Patient's last menstrual period was 08/26/2001. Contraception: hysterectomy Menopausal hormone therapy: none  Patient Active Problem List   Diagnosis Date Noted  . Incidental lung nodule, less than or equal to 68mm 07/12/2014  . 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 Thyroid disease   . Hiatal hernia   . Urinary incontinence   . Rosacea   . Strabismus   . Lung nodule     right, lower-seen on CT, had second CT 01/06/14-followed by Dr Roxan Hockey  . Complication of anesthesia     no fent/versed  . PONV (postoperative nausea and vomiting)   . Arthritis   . Hypothyroidism     MEDS:  Reviewed in EPIC and UTD  ALLERGIES: Fentanyl; Neosporin; Polysporin; Tetanus toxoids; and Versed  Family History  Problem Relation Age of Onset  . Adopted: Yes  . Cancer       NO FAMILY HX    SH:  Married, non smoker  Review of Systems  All other systems reviewed and are negative.   PHYSICAL EXAMINATION:    BP 118/66 mmHg  Pulse 72  Resp 14  Wt 168 lb 6.4 oz (76.386 kg)  LMP 08/26/2001    General appearance: alert, cooperative and appears stated age  Pelvic: External genitalia:  no lesions              Urethra:   normal appearing urethra with no masses, tenderness or lesions              Bartholins and Skenes: normal                 Vagina: normal appearing vagina with normal color and discharge, no lesions              Cervix: absent  Under sterile conditions, cath u/a was performed with adequate specimen obtained.  Pt tolerated procedure well and cath was removed without difficulty.  Chaperone was present for exam.  Assessment: Microscopic hematuria  Plan: Cath U/A results pending.

## 2016-03-05 ENCOUNTER — Telehealth: Payer: Self-pay | Admitting: Obstetrics & Gynecology

## 2016-03-05 LAB — URINALYSIS, MICROSCOPIC ONLY
Bacteria, UA: NONE SEEN [HPF]
CASTS: NONE SEEN [LPF]
CRYSTALS: NONE SEEN [HPF]
SQUAMOUS EPITHELIAL / LPF: NONE SEEN [HPF] (ref <=5)
WBC, UA: NONE SEEN WBC/HPF (ref <=5)
YEAST: NONE SEEN [HPF]

## 2016-03-05 NOTE — Telephone Encounter (Signed)
Spoke with patient. Advise her urine microscopic from 03/04/2016 is normal. Patient is asking what may have caused her to have such a high amount of blood in her urine and if she should proceed with a referral for a cystoscopy. States if Dr.Miller does not think a referral is needed at this time she would like to have a repeat urine check in 3 months. Advised I will speak with Dr.Miller regarding results and further recommendations and return call. She is agreeable and verbalizes understanding.

## 2016-03-05 NOTE — Telephone Encounter (Signed)
Patient is calling for recent lab results °

## 2016-03-05 NOTE — Telephone Encounter (Signed)
She does not need a referral to urology.  The dip test that is done is the office is a chemical reaction vs sending it to the lab for it to be analysed.  The micro to the lab is a better and more accurate test.  So, that is why this one is done if the first one is abnormal.  We can repeat the urine test next year in the lab just to be sure it remains negative for blood.

## 2016-03-06 NOTE — Telephone Encounter (Signed)
Spoke with patient. Advised of message as seen below from [place]. She is agreeable and verbalizes understanding. She is hesitant to wait until her aex to have her urine rechecked. Advised per Dr.Miller it is okay to wait until next year to recheck her urine. Advised if she develops any urinary symptoms at any time or has any concerns urine can be checked earlier in the office. She is agreeable.  Routing to provider for final review. Patient agreeable to disposition. Will close encounter.

## 2016-03-08 ENCOUNTER — Ambulatory Visit: Payer: Self-pay | Admitting: Obstetrics & Gynecology

## 2016-05-01 ENCOUNTER — Other Ambulatory Visit: Payer: Self-pay | Admitting: Orthopedic Surgery

## 2016-05-03 ENCOUNTER — Ambulatory Visit: Payer: BLUE CROSS/BLUE SHIELD | Admitting: Obstetrics & Gynecology

## 2016-05-09 ENCOUNTER — Encounter (HOSPITAL_BASED_OUTPATIENT_CLINIC_OR_DEPARTMENT_OTHER): Payer: Self-pay | Admitting: *Deleted

## 2016-05-16 ENCOUNTER — Ambulatory Visit (HOSPITAL_BASED_OUTPATIENT_CLINIC_OR_DEPARTMENT_OTHER): Payer: BLUE CROSS/BLUE SHIELD | Admitting: Certified Registered"

## 2016-05-16 ENCOUNTER — Ambulatory Visit (HOSPITAL_BASED_OUTPATIENT_CLINIC_OR_DEPARTMENT_OTHER)
Admission: RE | Admit: 2016-05-16 | Discharge: 2016-05-16 | Disposition: A | Discharge status: Home / Self Care | Payer: BLUE CROSS/BLUE SHIELD | Source: Ambulatory Visit | Attending: Orthopedic Surgery | Admitting: Orthopedic Surgery

## 2016-05-16 ENCOUNTER — Encounter (HOSPITAL_BASED_OUTPATIENT_CLINIC_OR_DEPARTMENT_OTHER)
Admission: RE | Disposition: A | Discharge status: Home / Self Care | Payer: Self-pay | Source: Ambulatory Visit | Attending: Orthopedic Surgery

## 2016-05-16 DIAGNOSIS — E039 Hypothyroidism, unspecified: Secondary | ICD-10-CM | POA: Diagnosis not present

## 2016-05-16 DIAGNOSIS — M65312 Trigger thumb, left thumb: Secondary | ICD-10-CM | POA: Diagnosis not present

## 2016-05-16 DIAGNOSIS — Z79899 Other long term (current) drug therapy: Secondary | ICD-10-CM | POA: Insufficient documentation

## 2016-05-16 DIAGNOSIS — Z87891 Personal history of nicotine dependence: Secondary | ICD-10-CM | POA: Diagnosis not present

## 2016-05-16 DIAGNOSIS — R32 Unspecified urinary incontinence: Secondary | ICD-10-CM | POA: Insufficient documentation

## 2016-05-16 HISTORY — PX: TRIGGER FINGER RELEASE: SHX641

## 2016-05-16 SURGERY — RELEASE, A1 PULLEY, FOR TRIGGER FINGER
Anesthesia: Monitor Anesthesia Care | Site: Thumb | Laterality: Left

## 2016-05-16 MED ORDER — PROPOFOL 500 MG/50ML IV EMUL
INTRAVENOUS | Status: DC | PRN
  Administered 2016-05-16: 100 ug/kg/min via INTRAVENOUS

## 2016-05-16 MED ORDER — LIDOCAINE HCL (CARDIAC) 20 MG/ML IV SOLN
INTRAVENOUS | Status: DC | PRN
  Administered 2016-05-16: 25 mg via INTRATRACHEAL

## 2016-05-16 MED ORDER — SCOPOLAMINE 1 MG/3DAYS TD PT72: 1.0000 | MEDICATED_PATCH | Freq: Once | TRANSDERMAL | Status: DC | PRN

## 2016-05-16 MED ORDER — ONDANSETRON HCL 4 MG/2ML IJ SOLN
INTRAMUSCULAR | Status: AC
  Filled 2016-05-16: qty 2

## 2016-05-16 MED ORDER — CHLORHEXIDINE GLUCONATE 4 % EX LIQD: 60.0000 mL | Freq: Once | CUTANEOUS | Status: DC

## 2016-05-16 MED ORDER — CEFAZOLIN SODIUM-DEXTROSE 2-4 GM/100ML-% IV SOLN
INTRAVENOUS | Status: AC
  Filled 2016-05-16: qty 100

## 2016-05-16 MED ORDER — CEFAZOLIN SODIUM-DEXTROSE 2-4 GM/100ML-% IV SOLN
2.0000 g | INTRAVENOUS | Status: AC
  Administered 2016-05-16: 2 g via INTRAVENOUS

## 2016-05-16 MED ORDER — HYDROMORPHONE HCL 1 MG/ML IJ SOLN: 0.2500 mg | INTRAMUSCULAR | Status: DC | PRN

## 2016-05-16 MED ORDER — BUPIVACAINE HCL (PF) 0.25 % IJ SOLN
INTRAMUSCULAR | Status: DC | PRN
  Administered 2016-05-16: 5 mL

## 2016-05-16 MED ORDER — ONDANSETRON HCL 4 MG/2ML IJ SOLN
INTRAMUSCULAR | Status: DC | PRN
  Administered 2016-05-16: 4 mg via INTRAVENOUS

## 2016-05-16 MED ORDER — GLYCOPYRROLATE 0.2 MG/ML IJ SOLN: 0.2000 mg | Freq: Once | INTRAMUSCULAR | Status: DC | PRN

## 2016-05-16 MED ORDER — IBUPROFEN 800 MG PO TABS: 800.0000 mg | ORAL_TABLET | Freq: Three times a day (TID) | ORAL | 0 refills | Status: AC | PRN

## 2016-05-16 MED ORDER — LIDOCAINE HCL (PF) 0.5 % IJ SOLN
INTRAMUSCULAR | Status: DC | PRN
  Administered 2016-05-16: 30 mL via INTRAVENOUS

## 2016-05-16 MED ORDER — LACTATED RINGERS IV SOLN
INTRAVENOUS | Status: DC
  Administered 2016-05-16: 09:00:00 via INTRAVENOUS

## 2016-05-16 SURGICAL SUPPLY — 37 items
BANDAGE COBAN STERILE 2 (GAUZE/BANDAGES/DRESSINGS) ×2 IMPLANT
BLADE MINI RND TIP GREEN BEAV (BLADE) IMPLANT
BLADE SURG 15 STRL LF DISP TIS (BLADE) ×2 IMPLANT
BLADE SURG 15 STRL SS (BLADE) ×4
BNDG CMPR 9X4 STRL LF SNTH (GAUZE/BANDAGES/DRESSINGS) ×1
BNDG CONFORM 2 STRL LF (GAUZE/BANDAGES/DRESSINGS) ×2 IMPLANT
BNDG ESMARK 4X9 LF (GAUZE/BANDAGES/DRESSINGS) ×1 IMPLANT
CHLORAPREP W/TINT 26ML (MISCELLANEOUS) ×2 IMPLANT
CORDS BIPOLAR (ELECTRODE) ×2 IMPLANT
COVER BACK TABLE 60X90IN (DRAPES) ×2 IMPLANT
COVER MAYO STAND STRL (DRAPES) ×2 IMPLANT
CUFF TOURNIQUET SINGLE 18IN (TOURNIQUET CUFF) ×2 IMPLANT
DRAPE EXTREMITY T 121X128X90 (DRAPE) ×2 IMPLANT
DRAPE SURG 17X23 STRL (DRAPES) ×2 IMPLANT
GAUZE SPONGE 4X4 12PLY STRL (GAUZE/BANDAGES/DRESSINGS) ×2 IMPLANT
GAUZE XEROFORM 1X8 LF (GAUZE/BANDAGES/DRESSINGS) ×2 IMPLANT
GLOVE BIO SURGEON STRL SZ7 (GLOVE) ×1 IMPLANT
GLOVE BIO SURGEON STRL SZ7.5 (GLOVE) ×2 IMPLANT
GLOVE BIOGEL PI IND STRL 7.0 (GLOVE) IMPLANT
GLOVE BIOGEL PI IND STRL 8 (GLOVE) ×1 IMPLANT
GLOVE BIOGEL PI INDICATOR 7.0 (GLOVE) ×1
GLOVE BIOGEL PI INDICATOR 8 (GLOVE) ×1
GOWN STRL REIN XL XLG (GOWN DISPOSABLE) ×2 IMPLANT
GOWN STRL REUS W/ TWL LRG LVL3 (GOWN DISPOSABLE) ×1 IMPLANT
GOWN STRL REUS W/TWL LRG LVL3 (GOWN DISPOSABLE) ×2
NDL HYPO 25X1 1.5 SAFETY (NEEDLE) IMPLANT
NEEDLE HYPO 25X1 1.5 SAFETY (NEEDLE) ×2 IMPLANT
NS IRRIG 1000ML POUR BTL (IV SOLUTION) ×2 IMPLANT
PACK BASIN DAY SURGERY FS (CUSTOM PROCEDURE TRAY) ×2 IMPLANT
PADDING CAST ABS 4INX4YD NS (CAST SUPPLIES)
PADDING CAST ABS COTTON 4X4 ST (CAST SUPPLIES) ×1 IMPLANT
STOCKINETTE 4X48 STRL (DRAPES) ×2 IMPLANT
SUT ETHILON 4 0 PS 2 18 (SUTURE) ×2 IMPLANT
SYR BULB 3OZ (MISCELLANEOUS) ×2 IMPLANT
SYR CONTROL 10ML LL (SYRINGE) ×1 IMPLANT
TOWEL OR 17X24 6PK STRL BLUE (TOWEL DISPOSABLE) ×4 IMPLANT
UNDERPAD 30X30 (UNDERPADS AND DIAPERS) ×2 IMPLANT

## 2016-05-16 NOTE — Discharge Instructions (Addendum)

## 2016-05-16 NOTE — Anesthesia Postprocedure Evaluation (Signed)
Anesthesia Post Note  Patient: Kara Mitchell  Procedure(s) Performed: Procedure(s) (LRB): LEFT THUMB RELEASE TRIGGER FINGER (Left)  Patient location during evaluation: PACU Anesthesia Type: MAC Level of consciousness: awake and alert Pain management: pain level controlled Vital Signs Assessment: post-procedure vital signs reviewed and stable Respiratory status: spontaneous breathing, nonlabored ventilation and respiratory function stable Cardiovascular status: stable and blood pressure returned to baseline Anesthetic complications: no    Last Vitals:  Vitals:   05/16/16 1034 05/16/16 1045  BP: 136/66 127/64  Pulse: 62 (!) 57  Resp: 15 (!) 21  Temp: 36.6 C     Last Pain:  Vitals:   05/16/16 1034  TempSrc:   PainSc: 0-No pain                 Mohab Ashby,W. EDMOND

## 2016-05-16 NOTE — Anesthesia Preprocedure Evaluation (Addendum)
Anesthesia Evaluation  Patient identified by MRN, date of birth, ID band Patient awake    Reviewed: Allergy & Precautions, H&P , NPO status , Patient's Chart, lab work & pertinent test results  History of Anesthesia Complications (+) PONV  Airway Mallampati: II  TM Distance: >3 FB Neck ROM: Full    Dental no notable dental hx. (+) Teeth Intact, Dental Advisory Given   Pulmonary neg pulmonary ROS, former smoker,    Pulmonary exam normal breath sounds clear to auscultation       Cardiovascular negative cardio ROS   Rhythm:Regular Rate:Normal     Neuro/Psych negative neurological ROS  negative psych ROS   GI/Hepatic negative GI ROS, Neg liver ROS,   Endo/Other  Hypothyroidism   Renal/GU negative Renal ROS  negative genitourinary   Musculoskeletal  (+) Arthritis ,   Abdominal   Peds  Hematology negative hematology ROS (+)   Anesthesia Other Findings   Reproductive/Obstetrics negative OB ROS                            Anesthesia Physical Anesthesia Plan  ASA: II  Anesthesia Plan: MAC and Bier Block   Post-op Pain Management:    Induction: Intravenous  Airway Management Planned: Simple Face Mask  Additional Equipment:   Intra-op Plan:   Post-operative Plan:   Informed Consent: I have reviewed the patients History and Physical, chart, labs and discussed the procedure including the risks, benefits and alternatives for the proposed anesthesia with the patient or authorized representative who has indicated his/her understanding and acceptance.   Dental advisory given  Plan Discussed with: CRNA  Anesthesia Plan Comments:        Anesthesia Quick Evaluation

## 2016-05-16 NOTE — Anesthesia Procedure Notes (Signed)
Procedure Name: MAC Date/Time: 05/16/2016 9:52 AM Performed by: Baxter Flattery Pre-anesthesia Checklist: Patient identified, Patient being monitored, Suction available and Emergency Drugs available Patient Re-evaluated:Patient Re-evaluated prior to inductionOxygen Delivery Method: Simple face mask Preoxygenation: Pre-oxygenation with 100% oxygen Intubation Type: IV induction Ventilation: Mask ventilation without difficulty Dental Injury: Teeth and Oropharynx as per pre-operative assessment

## 2016-05-16 NOTE — H&P (Signed)
Kara Mitchell is an 65 y.o. female.   Chief Complaint: left trigger thumb HPI: 65 yo female with triggering of left thumb.  This has been going on for 2 months.  She has had previous trigger digits that required surgical release and wishes to forego injection of the thumb and have it surgically released.  Allergies:  Allergies  Allergen Reactions  . Fentanyl Other (See Comments)    Low blood pressure  . Neosporin [Neomycin-Bacitracin Zn-Polymyx] Other (See Comments)    whelps  . Polysporin [Bacitracin-Polymyxin B] Other (See Comments)    whelps  . Tetanus Toxoids Other (See Comments)    fainting  . Versed [Midazolam] Other (See Comments)    Low blood pressure    Past Medical History:  Diagnosis Date  . Arthritis   . Complication of anesthesia    no fent/versed  . Hiatal hernia   . Hypothyroidism   . Lung nodule    right, lower-seen on CT, had second CT 01/06/14-followed by Dr Roxan Hockey  . PONV (postoperative nausea and vomiting)   . Rosacea   . Strabismus   . Thyroid disease   . Urinary incontinence     Past Surgical History:  Procedure Laterality Date  . ABDOMINAL HYSTERECTOMY    . BACK SURGERY  2012   lumb lam  . BLEPHAROPLASTY  10/2012  . BREAST BIOPSY  1995  . BREAST BIOPSY  1/95  . COSMETIC SURGERY     blethoplasty-bilat  . CYSTOCELE REPAIR  2007  . DISTAL INTERPHALANGEAL JOINT FUSION Right 12/21/2015   Procedure: DEBRIDEMENT OF RIGHT DISTAL INTERPHALANGEAL JOINT ;  Surgeon: Leanora Cover, MD;  Location: [place];  Service: Orthopedics;  Laterality: Right;  . ENTEROCELE REPAIR  2010  . ESOPHAGEAL DILATION  [id number]  . KNEE SURGERY     x2-lt  . MASS EXCISION Right 12/21/2015   Procedure: RIGHT INDEX EXCISION MASS;  Surgeon: Leanora Cover, MD;  Location: [place];  Service: Orthopedics;  Laterality: Right;  . OTHER SURGICAL HISTORY  2001   HNP L4-5  . OTHER SURGICAL HISTORY     HNP repair- [facility] Gell  . TONSILLECTOMY AND  ADENOIDECTOMY  1974  . TRIGGER FINGER RELEASE Right 02/18/2014   Procedure: RELEASE A-1 PULLEY RIGHT THUMB/POSSIBLE INJ TO LEFT THUMB;  Surgeon: Cammie Sickle, MD;  Location: [place];  Service: Orthopedics;  Laterality: Right;  . VEIN LIGATION AND STRIPPING  2009    Family History: Family History  Problem Relation Age of Onset  . Adopted: Yes  . Cancer       NO FAMILY HX    Social History:   reports that she quit smoking about 23 years ago. Her smoking use included Cigarettes. She has a 37.50 pack-year smoking history. She has never used smokeless tobacco. She reports that she drinks alcohol. She reports that she does not use drugs.  Medications: Medications Prior to Admission  Medication Sig Dispense Refill  . oxybutynin (DITROPAN XL) 10 MG 24 hr tablet Take 1 tablet (10 mg total) by mouth at bedtime. 30 tablet 12  . pantoprazole (PROTONIX) 40 MG tablet Take 40 mg by mouth daily.    Marland Kitchen thyroid (ARMOUR THYROID) 30 MG tablet Take 30 mg by mouth daily.    . Vitamin D, Ergocalciferol, (DRISDOL) 50000 UNITS CAPS capsule take 1 capsule by mouth every week 4 capsule 8  . metroNIDAZOLE (METROGEL) 1 % gel   2    No results found  for this or any previous visit (from the past 48 hour(s)).  No results found.   A comprehensive review of systems was negative.  Blood pressure 135/68, pulse 65, temperature 98.7 F (37.1 C), temperature source Oral, resp. rate 16, height 5' 3.5" (1.613 m), weight 77.1 kg (170 lb), last menstrual period 08/26/2001, SpO2 97 %.  General appearance: alert, cooperative and appears stated age Head: Normocephalic, without obvious abnormality, atraumatic Neck: supple, symmetrical, trachea midline Resp: clear to auscultation bilaterally Cardio: regular rate and rhythm GI: non-tender Extremities: Intact sensation and capillary refill all digits.  +epl/fpl/io.  No wounds.  Pulses: 2+ and symmetric Skin: Skin color, texture, turgor normal. No  rashes or lesions Neurologic: Grossly normal Incision/Wound:none  Assessment/Plan Left trigger thumb.  Non operative and operative treatment options were discussed with the patient and patient wishes to proceed with operative treatment. Risks, benefits, and alternatives of surgery were discussed and the patient agrees with the plan of care.   Jene Huq R 05/16/2016, 8:41 AM

## 2016-05-16 NOTE — Transfer of Care (Signed)
Immediate Anesthesia Transfer of Care Note  Patient: Kara Mitchell  Procedure(s) Performed: Procedure(s): LEFT THUMB RELEASE TRIGGER FINGER (Left)  Patient Location: PACU  Anesthesia Type:MAC and Bier block  Level of Consciousness: awake, alert , oriented and patient cooperative  Airway & Oxygen Therapy: Patient Spontanous Breathing  Post-op Assessment: Report given to RN, Post -op Vital signs reviewed and stable and Patient moving all extremities  Post vital signs: Reviewed and stable  Last Vitals:  Vitals:   05/16/16 0838 05/16/16 1034  BP: 135/68   Pulse: 65 62  Resp: 16 15  Temp: 37.1 C     Last Pain:  Vitals:   05/16/16 0838  TempSrc: Oral  PainSc: 0-No pain         Complications: No apparent anesthesia complications

## 2016-05-16 NOTE — Brief Op Note (Signed)
05/16/2016  10:31 AM  PATIENT:  Merideth Abbey  65 y.o. female  PRE-OPERATIVE DIAGNOSIS:  Left Thumb Trigger Digit  POST-OPERATIVE DIAGNOSIS:  Left Thumb Trigger Digit  PROCEDURE:  Procedure(s): LEFT THUMB RELEASE TRIGGER FINGER (Left)  SURGEON:  Surgeon(s) and Role:    * Leanora Cover, MD - Primary  PHYSICIAN ASSISTANT:   ASSISTANTS: none   ANESTHESIA:   Bier block with sedation  EBL:  Total I/O In: 600 [I.V.:600] Out: 3 [Blood:3]  BLOOD ADMINISTERED:none  DRAINS: none   LOCAL MEDICATIONS USED:  MARCAINE     SPECIMEN:  No Specimen  DISPOSITION OF SPECIMEN:  N/A  COUNTS:  YES  TOURNIQUET:  * Missing tourniquet times found for documented tourniquets in log:  [id number] *  DICTATION: .Note written in EPIC  PLAN OF CARE: Discharge to home after PACU  PATIENT DISPOSITION:  PACU - hemodynamically stable.

## 2016-05-16 NOTE — Op Note (Signed)
05/16/2016 [facility] OPERATIVE REPORT   PREOPERATIVE DIAGNOSIS: Left trigger thumb.  POSTOPERATIVE DIAGNOSIS:  Left trigger thumb.  PROCEDURE: Left trigger thumb release.  SURGEON:  Leanora Cover, MD  ASSISTANT:  None.  ANESTHESIA:  Bier block and sedation.  IV FLUIDS:  Per anesthesia flow sheet.  ESTIMATED BLOOD LOSS:  Minimal.  COMPLICATIONS:  None.  SPECIMENS:  None.  TOURNIQUET TIME: 29 minutes left forearm  DISPOSITION:  Stable to PACU.  LOCATION:  SURGERY CENTER  INDICATIONS: Kara Mitchell is a 65 y.o. female with triggering of the left thumb.  She wishes to have a trigger release.  Risks, benefits and alternatives of surgery were discussed including the risk of blood loss, infection, damage to nerves, vessels, tendons, ligaments, bone, failure of surgery, need for additional surgery, complications with wound healing, continued pain, continued triggering and need for repeat surgery.  She voiced understanding of these risks and elected to proceed.  OPERATIVE COURSE:  After being identified preoperatively by myself, the patient and I agreed upon the procedure and site of procedure.  The surgical site was marked. The risks, benefits, and alternatives of surgery were reviewed and she wished to proceed.  Surgical consent had been signed. She was given IV Ancef as preoperative antibiotic prophylaxis. She was transported to the operating room and placed on the operating room table in supine position with the Left upper extremity on an arm board. Bier block and sedation was induced by the anesthesiologist.  The Left upper extremity was prepped and draped in normal sterile orthopedic fashion. A surgical pause was performed between surgeons, anesthesia, and operating room staff, and all were in agreement as to the patient, procedure, and site of procedure.  Tourniquet at the proximal aspect of the forearm had been inflated for the Bier block.  An incision was  made transversely at the MP flexion crease of the thumb.  This was made through the skin only.  Spreading technique was used.  The radial and ulnar digital nerves were identified and were protected throughout the case. The flexor sheath was identified.  The A1 pulley was identified.  It was sharply incised.  It was released in its entirety.  Care was taken to avoid any release of the oblique pulley. The thumb was placed through a range of motion, there was noted to be no catching.  The wound was copiously irrigated with sterile saline. It was then closed with 4-0 nylon in a horizontal mattress fashion.  The wound was injected with 5 mL of 0.25% plain Marcaine to aid in postoperative analgesia.  It was then dressed with sterile Xeroform, 4x4s, and wrapped lightly with a Coban dressing.  Tourniquet was deflated at 29 minutes.  The fingertips were pink with brisk capillary refill after deflation of the tourniquet.  The operative drapes were broken down and the patient was awoken from anesthesia safely.  She was transferred back to the stretcher and taken to the PACU in stable condition.   I will see her back in the office in 1 week for postoperative followup.  I will give her a prescription for ibuprofen 800 mg PO q8 hours prn pain dispense #40.    Tennis Must, MD Electronically signed, 05/16/16

## 2016-05-17 ENCOUNTER — Encounter (HOSPITAL_BASED_OUTPATIENT_CLINIC_OR_DEPARTMENT_OTHER): Payer: Self-pay | Admitting: Orthopedic Surgery

## 2016-07-02 ENCOUNTER — Telehealth: Payer: Self-pay | Admitting: *Deleted

## 2016-07-02 NOTE — Telephone Encounter (Signed)
Call to patient. BMD results reviewed with patient and she verbalized understanding. Patient states she was told by Dr. Isaiah Blakes she would do her BMD scans every 2 years. Patient would also like for Dr. Sabra Heck to know that Dr. Forde Dandy has her taking 50,000 IU of vitamin d twice per week.   Routing to provider for final review. Patient agreeable to disposition. Will close encounter.

## 2016-07-04 ENCOUNTER — Encounter: Payer: Self-pay | Admitting: Obstetrics & Gynecology

## 2017-02-17 DIAGNOSIS — K219 Gastro-esophageal reflux disease without esophagitis: Secondary | ICD-10-CM | POA: Insufficient documentation

## 2017-02-25 ENCOUNTER — Other Ambulatory Visit: Payer: Self-pay | Admitting: Endocrinology

## 2017-02-25 DIAGNOSIS — R911 Solitary pulmonary nodule: Secondary | ICD-10-CM

## 2017-03-04 ENCOUNTER — Ambulatory Visit
Admission: RE | Admit: 2017-03-04 | Discharge: 2017-03-04 | Disposition: A | Discharge status: Home / Self Care | Payer: BLUE CROSS/BLUE SHIELD | Source: Ambulatory Visit | Attending: Endocrinology | Admitting: Endocrinology

## 2017-03-04 DIAGNOSIS — R911 Solitary pulmonary nodule: Secondary | ICD-10-CM

## 2017-03-14 ENCOUNTER — Other Ambulatory Visit: Payer: Self-pay | Admitting: Obstetrics & Gynecology

## 2017-03-14 MED ORDER — OXYBUTYNIN CHLORIDE ER 10 MG PO TB24: 10.0000 mg | ORAL_TABLET | Freq: Every day | ORAL | 2 refills | Status: DC

## 2017-03-14 NOTE — Telephone Encounter (Signed)
Medication refill request: Oxybutynin  Last AEX:  02/23/16 SM Next AEX: 06/06/17 SM  Last MMG (if hormonal medication request): 06/27/16 BIRADS1:Neg  Refill authorized: 02/23/16 #[id number]. Today please advise.

## 2017-03-14 NOTE — Telephone Encounter (Signed)
Patient's new pharmacy called requesting a new prescription for Oxybutinin ER 10 mg. The patient is switching pharmacies and has no refills at her old pharmacy.  [facility]

## 2017-03-25 ENCOUNTER — Ambulatory Visit: Payer: BLUE CROSS/BLUE SHIELD | Admitting: Thoracic Surgery (Cardiothoracic Vascular Surgery)

## 2017-03-26 ENCOUNTER — Encounter: Payer: Self-pay | Admitting: Thoracic Surgery (Cardiothoracic Vascular Surgery)

## 2017-03-26 ENCOUNTER — Ambulatory Visit (INDEPENDENT_AMBULATORY_CARE_PROVIDER_SITE_OTHER): Payer: BLUE CROSS/BLUE SHIELD | Admitting: Thoracic Surgery (Cardiothoracic Vascular Surgery)

## 2017-03-26 VITALS — BP 134/78 | HR 57 | Resp 16 | Ht 63.5 in | Wt 158.0 lb

## 2017-03-26 DIAGNOSIS — R911 Solitary pulmonary nodule: Secondary | ICD-10-CM

## 2017-03-26 NOTE — Progress Notes (Signed)
[address]       [place] [address]             [phone]       HPI: Kara Mitchell returns today to get my opinion regarding her recent CT scan  She is a 66 year old former smoker (37.5 pack years, quit 1999). She was found to have a 2.5 mm nodule in the superior segment of her left lower lobe in the fall of 2014. This was most likely a subpleural lymph node. She was anxious about it so we followed that and it did not change over 2 years.  She recently saw Dr. Forde Dandy requested a CT scan. That was done on 03/04/2017. There was nothing suspicious on the report, but she was concerned about the technique of the CT scan.  She has been feeling well with no chest pain or shortness of breath.  Past Medical History:  Diagnosis Date  . Arthritis   . Complication of anesthesia    no fent/versed  . Hiatal hernia   . Hypothyroidism   . Lung nodule    right, lower-seen on CT, had second CT 01/06/14-followed by Dr Roxan Hockey  . PONV (postoperative nausea and vomiting)   . Rosacea   . Strabismus   . Thyroid disease   . Urinary incontinence      Current Outpatient Prescriptions  Medication Sig Dispense Refill  . ibuprofen (ADVIL,MOTRIN) 800 MG tablet Take 1 tablet (800 mg total) by mouth every 8 (eight) hours as needed. 40 tablet 0  . oxybutynin (DITROPAN XL) 10 MG 24 hr tablet Take 1 tablet (10 mg total) by mouth at bedtime. 30 tablet 2  . pantoprazole (PROTONIX) 40 MG tablet Take 40 mg by mouth daily.    Marland Kitchen thyroid (ARMOUR THYROID) 30 MG tablet Take 30 mg by mouth daily.    . Vitamin D, Ergocalciferol, (DRISDOL) 50000 UNITS CAPS capsule take 1 capsule by mouth every week 4 capsule 8   No current facility-administered medications for this visit.     Physical Exam BP 134/78 (BP Location: Left Arm, Patient Position: Sitting, Cuff Size: Large)   Pulse (!) 57   Resp 16   Ht 5' 3.5" (1.613 m)   Wt 158 lb (71.7 kg)   LMP 08/26/2001   SpO2 98% Comment: RA  BMI  27.55 kg/m  Well-appearing 66 year old woman in no acute distress  Diagnostic Tests: CT CHEST WITHOUT CONTRAST  TECHNIQUE: Multidetector CT imaging of the chest was performed following the standard protocol without IV contrast.  COMPARISON:  Chest CT July 12, 2014  FINDINGS: Cardiovascular: There is no appreciable thoracic aortic aneurysm. Visualized great vessels appear unremarkable on this noncontrast enhanced study. There is slight atherosclerotic calcification in the thoracic aorta. Pericardium is not appreciably thickened.  Mediastinum/Nodes: Thyroid appears unremarkable. There is no appreciable thoracic adenopathy. There is a small hiatal hernia.  Lungs/Pleura: On axial slice 40 series 127, there is a stable 2 mm nodular opacity in the superior segment of the left lower lobe. There is slight posterior bibasilar atelectasis. There is no edema or consolidation. No pleural effusion or pleural thickening.  Upper Abdomen: In the visualized upper abdominal region, there is mild atherosclerotic calcification in the aorta. Visualized upper abdominal structures otherwise appear unremarkable.  Musculoskeletal: There is thoracic dextroscoliosis. There are no blastic or lytic bone lesions.  IMPRESSION: 1. 2 mm nodular opacity superior segment left lower lobe, stable. Stability over several years is indicative of benign etiology.  2.  Slight bibasilar atelectasis.  No edema or consolidation.  3.  No adenopathy.  4.  Small hiatal hernia.  5.  Aortic atherosclerosis.  No evident aneurysm.  Aortic Atherosclerosis (ICD10-I70.0).   Electronically Signed   By: Lowella Grip III M.D.   On: 03/04/2017 09:51 I personally reviewed the CT images and concur with the findings noted above. The left lower lobe subpleural nodule is unchanged going back to 2014, likely a benign lymph node.  Impression: Kara Mitchell is a 66 year old woman with past history of  tobacco abuse. She recently had a CT scan screening for lung cancer. It showed no suspicious lesions.  She was concerned that this might not be the right type of CT scan. I reassured her that this CT x-ray shows more detail and is of better quality study than the low dose scans.  She is anxious that if she doesn't see me at least every 3 years that she'll have to wait a prolonged period of time for an appointment. I reassured her that we can get her in within a week no matter what. If she was to come see me every 2 or 3 years I have no objection to that  Plan: She will continue follow-up with Dr. Forde Dandy.  I will be happy to see her as needed  [name], MD [facility] [phone]

## 2017-04-01 ENCOUNTER — Encounter: Payer: Self-pay | Admitting: Neurology

## 2017-04-01 ENCOUNTER — Ambulatory Visit (INDEPENDENT_AMBULATORY_CARE_PROVIDER_SITE_OTHER): Payer: BLUE CROSS/BLUE SHIELD | Admitting: Neurology

## 2017-04-01 VITALS — BP 128/74 | HR 66 | Ht 65.0 in | Wt 160.0 lb

## 2017-04-01 DIAGNOSIS — G478 Other sleep disorders: Secondary | ICD-10-CM | POA: Diagnosis not present

## 2017-04-01 DIAGNOSIS — G4719 Other hypersomnia: Secondary | ICD-10-CM | POA: Diagnosis not present

## 2017-04-01 DIAGNOSIS — G479 Sleep disorder, unspecified: Secondary | ICD-10-CM | POA: Diagnosis not present

## 2017-04-01 DIAGNOSIS — E663 Overweight: Secondary | ICD-10-CM

## 2017-04-01 DIAGNOSIS — G4762 Sleep related leg cramps: Secondary | ICD-10-CM | POA: Diagnosis not present

## 2017-04-01 NOTE — Patient Instructions (Signed)
Thank you for choosing [facility] for your neurological care! It was nice to meet you today! I appreciate that you entrust me with your sleep related concerns.  Here is what we discussed today and what we came up with as our plan for you:   Based on your symptoms and your exam I believe you are at risk for obstructive sleep apnea or OSA, and I think we should proceed with a sleep study to determine whether you do or do not have OSA and how severe it is. If you have more than mild OSA, I want you to consider treatment with CPAP. Please remember, the risks and ramifications of moderate to severe obstructive sleep apnea or OSA are: Cardiovascular disease, including congestive heart failure, stroke, difficult to control hypertension, arrhythmias, and even type 2 diabetes has been linked to untreated OSA. Sleep apnea causes disruption of sleep and sleep deprivation in most cases, which, in turn, can cause recurrent headaches, problems with memory, mood, concentration, focus, and vigilance. Most people with untreated sleep apnea report excessive daytime sleepiness, which can affect their ability to drive. Please do not drive if you feel sleepy.   I will likely see you back after your sleep study to go over the test results and where to go from there. We will call you after your sleep study to advise about the results (most likely, you will hear from Beverlee Nims, my nurse) and to set up an appointment at the time, as necessary.    Our sleep lab administrative assistant, Arrie Aran will meet with you or call you to schedule your sleep study. If you don't hear back from her by next week please feel free to call her at [phone]. This is her direct line and please leave a message with your phone number to call back if you get the voicemail box. She will call back as soon as possible.

## 2017-04-01 NOTE — Progress Notes (Signed)
Subjective:    Patient ID: Kara Mitchell is a 66 y.o. female.  HPI     Star Age, MD, PhD [facility] [address] [place], [place] [address]  Dear Dr. Forde Dandy,   I saw your patient, Kara Mitchell, upon your kind request in my neurologic clinic today for initial consultation of her sleep disorder, in particular, concern for underlying obstructive sleep apnea. The patient is unaccompanied today. As you know, Kara Mitchell is a 66 year old right-handed woman with an underlying medical history of esophageal stricture, hypothyroidism, pulmonary nodule, arthritis, knee surgery, status post tonsillectomy and status post low back surgery, overweight state, who reports possible snoring and excessive daytime somnolence. I reviewed your office note from 11/20/2016, which you kindly included. She quit smoking in 1994. She is divorced. She lives alone. She has 3 grown children. Family history is not available because she was adopted. She drinks alcohol occasionally, caffeine in the form of coffee about 2 cups per day. Her Epworth sleepiness score is 12 out of 24, fatigue score is 36 out of 63. She has no telltale Sx of RLS and no night to night nocturia. She does not recall dreaming. Her biggest complaint is not waking up rested despite getting about 8 hours of sleep on an average night and being tired during the day. She is able to nap but tries to avoid it. To be fully rested she requires at least 8 hours of sleep and she tries to get 8-1/2 hours on most nights. Bedtime is between 10 and 10:30 PM, wakeup time around 7. She does wake up with leg cramps and has to walk it out typically. Her symptoms have been ongoing for at least a year. She has 1 son and 2 twin daughters, age 65. She had back surgery twice, last time some 2 years ago under Dr. Arnoldo Morale. She had good results with her back surgeries but has to sleep on her back.  Her Past Medical History Is Significant  For: Past Medical History:  Diagnosis Date  . Arthritis   . Complication of anesthesia    no fent/versed  . Hiatal hernia   . Hypothyroidism   . Lung nodule    right, lower-seen on CT, had second CT 01/06/14-followed by Dr Roxan Hockey  . PONV (postoperative nausea and vomiting)   . Rosacea   . Strabismus   . Thyroid disease   . Urinary incontinence     Her Past Surgical History Is Significant For: Past Surgical History:  Procedure Laterality Date  . ABDOMINAL HYSTERECTOMY    . BACK SURGERY  2012   lumb lam  . BLEPHAROPLASTY  10/2012  . BREAST BIOPSY  1995  . BREAST BIOPSY  1/95  . COSMETIC SURGERY     blethoplasty-bilat  . CYSTOCELE REPAIR  2007  . DISTAL INTERPHALANGEAL JOINT FUSION Right 12/21/2015   Procedure: DEBRIDEMENT OF RIGHT DISTAL INTERPHALANGEAL JOINT ;  Surgeon: Leanora Cover, MD;  Location: [place];  Service: Orthopedics;  Laterality: Right;  . ENTEROCELE REPAIR  2010  . ESOPHAGEAL DILATION  4765;4650  . KNEE SURGERY     x2-lt  . MASS EXCISION Right 12/21/2015   Procedure: RIGHT INDEX EXCISION MASS;  Surgeon: Leanora Cover, MD;  Location: [place];  Service: Orthopedics;  Laterality: Right;  . OTHER SURGICAL HISTORY  2001   HNP L4-5  . OTHER SURGICAL HISTORY     HNP repair- Earle Gell  . TONSILLECTOMY AND ADENOIDECTOMY  [place] Right 02/18/2014   Procedure: RELEASE A-1 PULLEY RIGHT THUMB/POSSIBLE INJ TO LEFT THUMB;  Surgeon: Cammie Sickle, MD;  Location: [place];  Service: Orthopedics;  Laterality: Right;  . TRIGGER FINGER RELEASE Left 05/16/2016   Procedure: LEFT THUMB RELEASE TRIGGER FINGER;  Surgeon: Leanora Cover, MD;  Location: [place];  Service: Orthopedics;  Laterality: Left;  Marland Kitchen VEIN LIGATION AND STRIPPING  2009    Her Family History Is Significant For: Family History  Problem Relation Age of Onset  . Adopted: Yes  . Cancer Unknown         NO FAMILY HX     Her Social History Is Significant For: Social History   Social History  . Marital status: Divorced    Spouse name: N/A  . Number of children: N/A  . Years of education: N/A   Social History Main Topics  . Smoking status: Former Smoker    Packs/day: 1.50    Years: 25.00    Types: Cigarettes    Quit date: 01/08/1993  . Smokeless tobacco: Never Used  . Alcohol use 0.0 oz/week     Comment: rarely  . Drug use: No  . Sexual activity: No     Comment: [facility]   Other Topics Concern  . None   Social History Narrative  . None    Her Allergies Are:  Allergies  Allergen Reactions  . Fentanyl Other (See Comments)    Low blood pressure  . Neosporin [Neomycin-Bacitracin Zn-Polymyx] Other (See Comments)    whelps  . Polysporin [Bacitracin-Polymyxin B] Other (See Comments)    whelps  . Tetanus Toxoids Other (See Comments)    fainting  . Versed [Midazolam] Other (See Comments)    Low blood pressure  :   Her Current Medications Are:  Outpatient Encounter Prescriptions as of 04/01/2017  Medication Sig  . ibuprofen (ADVIL,MOTRIN) 800 MG tablet Take 1 tablet (800 mg total) by mouth every 8 (eight) hours as needed.  Marland Kitchen oxybutynin (DITROPAN XL) 10 MG 24 hr tablet Take 1 tablet (10 mg total) by mouth at bedtime.  . pantoprazole (PROTONIX) 40 MG tablet Take 40 mg by mouth daily.  Marland Kitchen thyroid (ARMOUR THYROID) 30 MG tablet Take 30 mg by mouth daily.  . Vitamin D, Ergocalciferol, (DRISDOL) 50000 UNITS CAPS capsule take 1 capsule by mouth every week   No facility-administered encounter medications on file as of 04/01/2017.   :  Review of Systems:  Out of a complete 14 point review of systems, all are reviewed and negative with the exception of these symptoms as listed below: Review of Systems  Neurological:       Pt presents today to discuss her sleep. Pt has never had a sleep study and is not sure if she snores. Pt is very tired during the day.  Epworth Sleepiness Scale 0= would never  doze 1= slight chance of dozing 2= moderate chance of dozing 3= high chance of dozing  Sitting and reading: 1 Watching TV: 2 Sitting inactive in a public place (ex. Theater or meeting): 1 As a passenger in a car for an hour without a break: 1 Lying down to rest in the afternoon: 3 Sitting and talking to someone: 1 Sitting quietly after lunch (no alcohol): 3 In a car, while stopped in traffic: 0 Total: 12     Objective:  Neurological Exam  Physical Exam Physical Examination:   Vitals:   04/01/17 1332  BP: 128/74  Pulse: 66    General Examination: The patient is a very pleasant 66 y.o. female in no acute distress. She appears well-developed and well-nourished and very well groomed.   HEENT: Normocephalic, atraumatic, pupils are equal, round and reactive to light and accommodation. Funduscopic exam is normal with sharp disc margins noted. Extraocular tracking is good without limitation to gaze excursion or nystagmus noted. Normal smooth pursuit is noted. Hearing is grossly intact. Face is symmetric with normal facial animation and normal facial sensation. Speech is clear with no dysarthria noted. There is no hypophonia. There is no lip, neck/head, jaw or voice tremor. Neck is supple with full range of passive and active motion. There are no carotid bruits on auscultation. Oropharynx exam reveals: mild mouth dryness, good dental hygiene and moderate airway crowding, due to Smaller airway entry and larger uvula, floppy appearing soft palate, tonsils are absent. Mallampati is class II. Neck circumference is 14 inches. She has a mild to moderate overbite.  Chest: Clear to auscultation without wheezing, rhonchi or crackles noted.  Heart: S1+S2+0, regular and normal without murmurs, rubs or gallops noted.   Abdomen: Soft, non-tender and non-distended with normal bowel sounds appreciated on auscultation.  Extremities: There is no pitting edema in the distal lower extremities bilaterally.  Pedal pulses are intact.  Skin: Warm and dry without trophic changes noted.  Musculoskeletal: exam reveals no obvious joint deformities, tenderness or joint swelling or erythema. Puffy right ankle from prior injury as a child she reports.  Neurologically:  Mental status: The patient is awake, alert and oriented in all 4 spheres. Her immediate and remote memory, attention, language skills and fund of knowledge are appropriate. There is no evidence of aphasia, agnosia, apraxia or anomia. Speech is clear with normal prosody and enunciation. Thought process is linear. Mood is normal and affect is normal.  Cranial nerves II - XII are as described above under HEENT exam. In addition: shoulder shrug is normal with equal shoulder height noted. Motor exam: Normal bulk, strength and tone is noted. There is no drift, tremor or rebound. Romberg is negative. Reflexes are 2+ throughout. Fine motor skills and coordination: intact with normal finger taps, normal hand movements, normal rapid alternating patting, normal foot taps and normal foot agility.  Cerebellar testing: No dysmetria or intention tremor on finger to nose testing. Heel to shin is unremarkable bilaterally, mild pulling reported in the right lower back. There is no truncal or gait ataxia.  Sensory exam: intact to light touch in the upper and lower extremities.  Gait, station and balance: She stands easily. No veering to one side is noted. No leaning to one side is noted. Posture is age-appropriate and stance is narrow based. Gait shows normal stride length and normal pace. No problems turning are noted. Tandem walk is unremarkable.             Assessment and Plan:  In summary, Kara Mitchell is a very pleasant 66 y.o.-year old female with an underlying medical history of esophageal stricture, hypothyroidism, pulmonary nodule, arthritis, knee surgery, status post tonsillectomy and status post low back surgery, overweight state, whose history and  physical exam are concerning for obstructive sleep apnea (OSA). I had a long chat with the patient about my findings and the diagnosis of OSA, its prognosis and treatment options. We talked about medical treatments, surgical interventions and non-pharmacological approaches. I explained in particular the risks and ramifications of untreated moderate to severe OSA, especially with respect to developing  cardiovascular disease down the Road, including congestive heart failure, difficult to treat hypertension, cardiac arrhythmias, or stroke. Even type 2 diabetes has, in part, been linked to untreated OSA. Symptoms of untreated OSA include daytime sleepiness, memory problems, mood irritability and mood disorder such as depression and anxiety, lack of energy, as well as recurrent headaches, especially morning headaches. We talked about trying to maintain a healthy lifestyle in general, as well as the importance of weight control. I encouraged the patient to eat healthy, exercise daily and keep well hydrated, to keep a scheduled bedtime and wake time routine, to not skip any meals and eat healthy snacks in between meals. I advised the patient not to drive when feeling sleepy. I recommended the following at this time: sleep study with potential positive airway pressure titration. (We will score hypopneas at 3%).   I explained the sleep test procedure to the patient and also outlined possible surgical and non-surgical treatment options of OSA, including the use of a custom-made dental device (which would require a referral to a specialist dentist or oral surgeon), upper airway surgical options, such as pillar implants, radiofrequency surgery, tongue base surgery, and UPPP (which would involve a referral to an ENT surgeon). Rarely, jaw surgery such as mandibular advancement may be considered.  I also explained the CPAP treatment option to the patient, who indicated that she would be willing to try CPAP if the need arises.  I explained the importance of being compliant with PAP treatment, not only for insurance purposes but primarily to improve Her symptoms, and for the patient's long term health benefit, including to reduce Her cardiovascular risks. I answered all her questions today and the patient was in agreement. I would like to see her back after the sleep study is completed and encouraged her to call with any interim questions, concerns, problems or updates.   Thank you very much for allowing me to participate in the care of this nice patient. If I can be of any further assistance to you please do not hesitate to call me at [phone].  Sincerely,   Star Age, MD, PhD

## 2017-04-09 ENCOUNTER — Telehealth: Payer: Self-pay | Admitting: Neurology

## 2017-04-09 DIAGNOSIS — G4719 Other hypersomnia: Secondary | ICD-10-CM

## 2017-04-09 NOTE — Telephone Encounter (Signed)
[employer] denied Split sleep and approved HST.  Can I get an order for HST? °

## 2017-04-09 NOTE — Telephone Encounter (Signed)
HST order placed. 

## 2017-04-16 ENCOUNTER — Ambulatory Visit (INDEPENDENT_AMBULATORY_CARE_PROVIDER_SITE_OTHER): Payer: BLUE CROSS/BLUE SHIELD | Admitting: Neurology

## 2017-04-16 DIAGNOSIS — G4733 Obstructive sleep apnea (adult) (pediatric): Secondary | ICD-10-CM | POA: Diagnosis not present

## 2017-04-21 ENCOUNTER — Telehealth: Payer: Self-pay | Admitting: Neurology

## 2017-04-21 NOTE — Telephone Encounter (Signed)
Pt called request sleep results. I advised her it can take up to 14 days for results to be read. She understood but was anxious to get results.

## 2017-04-22 ENCOUNTER — Telehealth: Payer: Self-pay

## 2017-04-22 NOTE — Addendum Note (Signed)
Addended by: Star Age on: 04/22/2017 07:34 AM   Modules accepted: Orders

## 2017-04-22 NOTE — Procedures (Signed)
[facility] [place], [place] [address] NAME: Kara Mitchell DOB: 03-30-1951 MEDICAL RECORD [id number] DOS: 04/16/17 REFERRING PHYSICIAN: Annie Main South,MD STUDY PERFORMED: HST HISTORY: 66 year old woman with a history of esophageal stricture, hypothyroidism, pulmonary nodule, arthritis, knee surgery, status post tonsillectomy, status post low back surgery, and overweight state, who reports snoring, non-restorative sleep, and excessive daytime somnolence. Her Epworth sleepiness score is 12 out of 24. BMI of 26.6. STUDY RESULTS: Total Recording Time: 9h 0 min, Total evaluation time: 5 h, 17 min Total Apnea/Hypopnea Index (AHI): 7.2/hour Average Oxygen Saturation: 92% Lowest Oxygen Saturation: 78% Average Heart Rate: 59 bpm IMPRESSION: OSA RECOMMENDATION: This home sleep test demonstrates overall mild obstructive sleep apnea with a total AHI of 7.2/hour and O2 nadir of 78%. Given the patient's medical history and sleep related complaints, treatment with positive airway pressure is feasible. This can be achieved in the form of autoPAP trial/titration at home. A full night CPAP titration study will help with proper treatment settings and mask fitting if needed.  Please note that untreated obstructive sleep apnea carries additional perioperative morbidity. Patients with significant obstructive sleep apnea should receive perioperative PAP therapy and the surgeons and particularly the anesthesiologist should be informed of the diagnosis and the severity of the sleep disordered breathing. The patient should be cautioned not to drive, work at heights, or operate dangerous or heavy equipment when tired or sleepy. Review and reiteration of good sleep hygiene measures should be pursued with any patient. Other causes of the patient's symptoms, including circadian rhythm disturbances, an underlying mood disorder, medication effect and/or an underlying medical  problem cannot be ruled out based on this test. Clinical correlation is recommended.  The patient and his referring provider will be notified of the test results. The patient will be seen in follow up in sleep clinic at [facility].  I certify that I have reviewed the raw data recording prior to the issuance of this report in accordance with the standards of the American Academy of Sleep Medicine (AASM).  Star Age, MD, PhD [facility]) Diplomat, ABPN (Neurology and Sleep)

## 2017-04-22 NOTE — Progress Notes (Signed)
Patient referred by Dr. Forde Dandy, seen by me on 04/01/17, HST on 04/16/17.     Please call and notify the patient that the recent sleep study showed mild obstructive sleep apnea. It may be worth treating to see if she feels better after treatment. To that end I recommend treatment for this in the form of autoPAP, which means, that we don't have to bring her in for a sleep study with CPAP, but will let her try an autoPAP machine at home, through a DME company (of her choice, or as per insurance requirement). The DME representative will educate her on how to use the machine, how to put the mask on, etc. I have placed an order in the chart, in case she is agreeable to trying this. Please send referral, talk to patient, send report to PCP and referring MD. We will need a FU in sleep clinic for 10 weeks post-PAP set up, please arrange that as well. May see one of the nurse practitioners for timing reasons. Thanks,   Star Age, MD, PhD [facility])

## 2017-04-22 NOTE — Telephone Encounter (Signed)
I called pt. I advised pt that Dr. Rexene Alberts reviewed their sleep study results and found that pt has mild osa. Dr. Rexene Alberts recommends that pt try an auto pap at home to see if she feels better. I reviewed PAP compliance expectations with the pt. Pt is agreeable to starting an auto-PAP. I advised pt that an order will be sent to a DME, [facility], and [facility] will call the pt within about one week after they file with the pt's insurance. [facility] will show the pt how to use the machine, fit for masks, and troubleshoot the auto-PAP if needed. A follow up appt was made for insurance purposes with Dr. Rexene Alberts on Monday, October 29th, 2018 at 2:00pm. Pt verbalized understanding to arrive 15 minutes early and bring their auto-PAP. A letter with all of this information in it will be mailed to the pt as a reminder. I verified with the pt that the address we have on file is correct but pt asked that I send her letter to [place] [id number]. Pt asked that her HST results be sent to Dr. Forde Dandy. Pt verbalized understanding of results. Pt had no questions at this time but was encouraged to call back if questions arise.

## 2017-04-22 NOTE — Telephone Encounter (Signed)
-----   Message from Star Age, MD sent at 04/22/2017  7:34 AM EDT ----- Patient referred by Dr. Forde Dandy, seen by me on 04/01/17, HST on 04/16/17.     Please call and notify the patient that the recent sleep study showed mild obstructive sleep apnea. It may be worth treating to see if she feels better after treatment. To that end I recommend treatment for this in the form of autoPAP, which means, that we don't have to bring her in for a sleep study with CPAP, but will let her try an autoPAP machine at home, through a DME company (of her choice, or as per insurance requirement). The DME representative will educate her on how to use the machine, how to put the mask on, etc. I have placed an order in the chart, in case she is agreeable to trying this. Please send referral, talk to patient, send report to PCP and referring MD. We will need a FU in sleep clinic for 10 weeks post-PAP set up, please arrange that as well. May see one of the nurse practitioners for timing reasons. Thanks,   Star Age, MD, PhD [facility])

## 2017-05-26 ENCOUNTER — Telehealth: Payer: Self-pay | Admitting: Neurology

## 2017-05-26 DIAGNOSIS — Z9989 Dependence on other enabling machines and devices: Principal | ICD-10-CM

## 2017-05-26 DIAGNOSIS — G4733 Obstructive sleep apnea (adult) (pediatric): Secondary | ICD-10-CM

## 2017-05-26 NOTE — Telephone Encounter (Signed)
I reviewed her AutoPap compliance data for the past month, she used her AutoPap 6 days. 95th percentile pressure at 9.7 cm, range is 4-12 cm. Residual AHI 4.4 per hour. I suggest we change from AutoPap to set pressure of 8 cm with a 20 minute ramp time and also utilize a chin strap.

## 2017-05-26 NOTE — Telephone Encounter (Signed)
Pt took her CPAP machine to [facility] today bc she feels like she is smothering, it is forcing her to sleep with her mouth open. Her lips are very chapped. They determined it needs to be increased to 7, please send an order. She can be reached today to discuss.

## 2017-05-27 NOTE — Telephone Encounter (Signed)
I called pt, advised her that Dr. Rexene Alberts recommends changing her pressure to 8 cm H2O and to utilize a chin strap. Pt is agreeable to this. Pt will call back with further questions or concerns. Pt verbalized understanding. Order sent to [facility].

## 2017-06-05 NOTE — Progress Notes (Addendum)
66 y.o. G32P2003 Divorced Caucasian F here for annual exam.  Doing well.  Had cataracts surgery last year as well at trigger finger and cyst removed from a different finger.  Denies vaginal bleeding.    PCP:  Dr. Forde Dandy.    Patient's last menstrual period was 08/26/2001.          Sexually active: No.  The current method of family planning is status post hysterectomy.   Exercising: No.  The patient does not participate in regular exercise at present. Smoker: Former smoker  Health Maintenance: Pap:  02/23/16 negative, 01/10/14 negative  History of abnormal Pap:  no MMG:  06/27/16 BIRADS 1 negative  Colonoscopy:  2014 with Dr. Earlean Shawl, patient states she will do next year BMD:   06/27/16 osteopenia   TDaP:  11/24/08 Pneumonia vaccine(s):  never Zostavax:   never Hep C testing: 02/23/16 negative  Screening Labs: PCP, Hb today: PCP   reports that she quit smoking about 24 years ago. Her smoking use included Cigarettes. She has a 37.50 pack-year smoking history. She has never used smokeless tobacco. She reports that she drinks alcohol. She reports that she does not use drugs.  Past Medical History:  Diagnosis Date  . Arthritis   . Complication of anesthesia    no fent/versed  . Hiatal hernia   . Hypothyroidism   . Lung nodule    right, lower-seen on CT, had second CT 01/06/14-followed by Dr Roxan Hockey  . PONV (postoperative nausea and vomiting)   . Rosacea   . Strabismus   . Thyroid disease   . Urinary incontinence     Past Surgical History:  Procedure Laterality Date  . ABDOMINAL HYSTERECTOMY  05/03/2002  . BACK SURGERY  2012   lumb lam  . BLEPHAROPLASTY  10/2012  . BREAST BIOPSY  1/95  . CYSTOCELE REPAIR  2007  . DISTAL INTERPHALANGEAL JOINT FUSION Right 12/21/2015   Procedure: DEBRIDEMENT OF RIGHT DISTAL INTERPHALANGEAL JOINT ;  Surgeon: Leanora Cover, MD;  Location: [place];  Service: Orthopedics;  Laterality: Right;  . ENTEROCELE REPAIR  2010  . ESOPHAGEAL  DILATION  [id number]  . KNEE SURGERY     x2-lt  . MASS EXCISION Right 12/21/2015   Procedure: RIGHT INDEX EXCISION MASS;  Surgeon: Leanora Cover, MD;  Location: [place];  Service: Orthopedics;  Laterality: Right;  . OTHER SURGICAL HISTORY  2001   HNP L4-5, Dr. Arnoldo Morale  . TONSILLECTOMY AND ADENOIDECTOMY  1974  . TRIGGER FINGER RELEASE Right 02/18/2014   Procedure: RELEASE A-1 PULLEY RIGHT THUMB/POSSIBLE INJ TO LEFT THUMB;  Surgeon: Cammie Sickle, MD;  Location: [facility];  Service: Orthopedics;  Laterality: Right;  . TRIGGER FINGER RELEASE Left 05/16/2016   Procedure: LEFT THUMB RELEASE TRIGGER FINGER;  Surgeon: Leanora Cover, MD;  Location: [facility];  Service: Orthopedics;  Laterality: Left;  Marland Kitchen VEIN LIGATION AND STRIPPING  2009    Current Outpatient Prescriptions  Medication Sig Dispense Refill  . ibuprofen (ADVIL,MOTRIN) 800 MG tablet Take 1 tablet (800 mg total) by mouth every 8 (eight) hours as needed. 40 tablet 0  . oxybutynin (DITROPAN XL) 10 MG 24 hr tablet Take 1 tablet (10 mg total) by mouth daily. 30 tablet 12  . pantoprazole (PROTONIX) 40 MG tablet Take 40 mg by mouth daily.    Marland Kitchen thyroid (ARMOUR THYROID) 30 MG tablet Take 30 mg by mouth daily.    . Vitamin D, Ergocalciferol, (DRISDOL) 50000 UNITS CAPS  capsule take 1 capsule by mouth every week 4 capsule 8   No current facility-administered medications for this visit.     Family History  Problem Relation Age of Onset  . Adopted: Yes  . Cancer Unknown         NO FAMILY HX    ROS:  Pertinent items are noted in HPI.  Otherwise, a comprehensive ROS was negative.  Exam:   BP 130/80 (BP Location: Right Arm, Patient Position: Sitting, Cuff Size: Normal)   Pulse (!) 58   Resp 14   Ht 5' 3.5" (1.613 m)   Wt 163 lb (73.9 kg)   LMP 08/26/2001   BMI 28.42 kg/m    Height: 5' 3.5" (161.3 cm)  Ht Readings from Last 3 Encounters:  06/06/17 5' 3.5" (1.613 m)  04/01/17 5\' 5"  (1.651  m)  03/26/17 5' 3.5" (1.613 m)    General appearance: alert, cooperative and appears stated age Head: Normocephalic, without obvious abnormality, atraumatic Neck: no adenopathy, supple, symmetrical, trachea midline and thyroid normal to inspection and palpation Lungs: clear to auscultation bilaterally Breasts: normal appearance, no masses or tenderness Heart: regular rate and rhythm Abdomen: soft, non-tender; bowel sounds normal; no masses,  no organomegaly Extremities: extremities normal, atraumatic, no cyanosis or edema Skin: Skin color, texture, turgor normal. No rashes or lesions Lymph nodes: Cervical, supraclavicular, and axillary nodes normal. No abnormal inguinal nodes palpated Neurologic: Grossly normal   Pelvic: External genitalia:  no lesions              Urethra:  normal appearing urethra with no masses, tenderness or lesions              Bartholins and Skenes: normal                 Vagina: normal appearing vagina with normal color and discharge, no lesions, 3rd degree cystocele              Cervix: absent              Pap taken: No. Bimanual Exam:  Uterus:  uterus absent              Adnexa: no mass, fullness, tenderness               Rectovaginal: Confirms               Anus:  normal sphincter tone, no lesions  Chaperone was present for exam.  A:  Well Woman with normal exam PMP, no HRT S/p TVH with anterior repair 3/09 Grade 3 rectocele  P:   Mammogram guidelines reviewed with pt pap smear 2017.  No pap smear obtained today No blood work obtained today ENT referral due to difficulty with breathing with CPAP Lab work done with Dr. Forde Dandy Declines shingles and pneumonia vaccine Rx for Ditropan XL 10mg  daily.  #30/12RF Return annually or prn

## 2017-06-06 ENCOUNTER — Ambulatory Visit (INDEPENDENT_AMBULATORY_CARE_PROVIDER_SITE_OTHER): Payer: BLUE CROSS/BLUE SHIELD | Admitting: Obstetrics & Gynecology

## 2017-06-06 ENCOUNTER — Telehealth: Payer: Self-pay | Admitting: Obstetrics & Gynecology

## 2017-06-06 ENCOUNTER — Encounter: Payer: Self-pay | Admitting: Obstetrics & Gynecology

## 2017-06-06 VITALS — BP 130/80 | HR 58 | Resp 14 | Ht 63.5 in | Wt 163.0 lb

## 2017-06-06 DIAGNOSIS — J349 Unspecified disorder of nose and nasal sinuses: Secondary | ICD-10-CM | POA: Diagnosis not present

## 2017-06-06 DIAGNOSIS — G4733 Obstructive sleep apnea (adult) (pediatric): Secondary | ICD-10-CM | POA: Diagnosis not present

## 2017-06-06 DIAGNOSIS — Z01419 Encounter for gynecological examination (general) (routine) without abnormal findings: Secondary | ICD-10-CM | POA: Diagnosis not present

## 2017-06-06 MED ORDER — OXYBUTYNIN CHLORIDE ER 10 MG PO TB24: 10.0000 mg | ORAL_TABLET | Freq: Every day | ORAL | 12 refills | Status: DC

## 2017-06-06 NOTE — Telephone Encounter (Signed)
Left voicemail regarding referral appointment. The information is listed below. Should the patient need to cancel or reschedule this appointment, Please advise them to call the office they've been referred to in order to reschedule. [facility] [address], [place] [address] [phone]  Dr. Melida Quitter 06/12/17 @ 2:30pm. Please arrive 30 minutes early and bring your insurance card, photo id and list of medications.

## 2017-06-07 ENCOUNTER — Other Ambulatory Visit: Payer: Self-pay | Admitting: Obstetrics and Gynecology

## 2017-06-10 ENCOUNTER — Other Ambulatory Visit: Payer: Self-pay | Admitting: Neurosurgery

## 2017-06-10 DIAGNOSIS — M5441 Lumbago with sciatica, right side: Secondary | ICD-10-CM

## 2017-06-23 ENCOUNTER — Encounter: Payer: Self-pay | Admitting: Neurology

## 2017-06-23 ENCOUNTER — Ambulatory Visit (INDEPENDENT_AMBULATORY_CARE_PROVIDER_SITE_OTHER): Payer: BLUE CROSS/BLUE SHIELD | Admitting: Neurology

## 2017-06-23 VITALS — BP 136/72 | HR 74 | Ht 64.0 in | Wt 163.0 lb

## 2017-06-23 DIAGNOSIS — Z789 Other specified health status: Secondary | ICD-10-CM | POA: Diagnosis not present

## 2017-06-23 DIAGNOSIS — G4733 Obstructive sleep apnea (adult) (pediatric): Secondary | ICD-10-CM | POA: Diagnosis not present

## 2017-06-23 DIAGNOSIS — Z9989 Dependence on other enabling machines and devices: Secondary | ICD-10-CM

## 2017-06-23 NOTE — Progress Notes (Signed)
Subjective:    Patient ID: Kara Mitchell is a 66 y.o. female.  HPI     Interim history:   Kara Mitchell is a 66 year old right-handed woman with an underlying medical history of esophageal stricture, hypothyroidism, pulmonary nodule, arthritis, knee surgery, status post tonsillectomy and status post low back surgery, overweight state, who presents for follow-up consultation of her obstructive sleep apnea, after recent home sleep testing and starting Pap therapy at home. The patient is unaccompanied today. I first met her on 04/01/2017 at the request of her primary care physician, at which time she reported snoring and daytime somnolence. I suggested we proceed with sleep study testing. She had a home sleep test on 04/16/2017 indicating overall mild sleep apnea with an index of 7.2 per hour, O2 nadir of 78%. I suggested we proceed with AutoPap therapy. She called with problems tolerating the pressure and mask and mouth dryness. I changed her settings to CPAP of 8 cm and addition of chinstrap.  Today, 06/23/2017: I reviewed her CPAP compliance data from 05/27/2017 through 06/18/2017 which is a total of 23 days, during which time she used her machine 22 days with percent used days greater than 4 hours at 96%, indicating excellent compliance with an average usage of 7 hours and 12 minutes, residual AHI 4.8 per hour, leak high with the 95th percentile at 32.8 L/m on a pressure of 8 cm with EPR of 3. She reports still struggling with her CPAP. She does not like to use it. She is not able to be comfortable with it. She would like to look into using an oral appliance. She saw Dr. Redmond Baseman in ENT on 06/12/17, and he recommended trial of afrin for a few days. She has mild inferior turbinate hypertrophy. On the positive side, she does feel like she is not as sleepy during the day after having used CPAP. She notices the higher leak. She also would like to be able to tolerate treatment better throughout the night.    The patient's allergies, current medications, family history, past medical history, past social history, past surgical history and problem list were reviewed and updated as appropriate.   Previously (copied from previous notes for reference):   04/01/17: (She) reports possible snoring and excessive daytime somnolence. I reviewed your office note from 11/20/2016, which you kindly included. She quit smoking in 1994. She is divorced. She lives alone. She has 3 grown children. Family history is not available because she was adopted. She drinks alcohol occasionally, caffeine in the form of coffee about 2 cups per day. Her Epworth sleepiness score is 12 out of 24, fatigue score is 36 out of 63. She has no telltale Sx of RLS and no night to night nocturia. She does not recall dreaming. Her biggest complaint is not waking up rested despite getting about 8 hours of sleep on an average night and being tired during the day. She is able to nap but tries to avoid it. To be fully rested she requires at least 8 hours of sleep and she tries to get 8-1/2 hours on most nights. Bedtime is between 10 and 10:30 PM, wakeup time around 7. She does wake up with leg cramps and has to walk it out typically. Her symptoms have been ongoing for at least a year. She has 1 son and 2 twin daughters, age 9. She had back surgery twice, last time some 2 years ago under Dr. Arnoldo Morale. She had good results with her back surgeries but has  to sleep on her back.  Her Past Medical History Is Significant For: Past Medical History:  Diagnosis Date  . Arthritis   . Complication of anesthesia    no fent/versed  . Hiatal hernia   . Hypothyroidism   . Lung nodule    right, lower-seen on CT, had second CT 01/06/14-followed by Dr Roxan Hockey  . PONV (postoperative nausea and vomiting)   . Rosacea   . Strabismus   . Thyroid disease   . Urinary incontinence     Her Past Surgical History Is Significant For: Past Surgical History:  Procedure  Laterality Date  . ABDOMINAL HYSTERECTOMY  05/03/2002  . BACK SURGERY  2012   lumb lam  . BLEPHAROPLASTY  10/2012  . BREAST BIOPSY  1/95  . CYSTOCELE REPAIR  2007  . DISTAL INTERPHALANGEAL JOINT FUSION Right 12/21/2015   Procedure: DEBRIDEMENT OF RIGHT DISTAL INTERPHALANGEAL JOINT ;  Surgeon: Leanora Cover, MD;  Location: [place];  Service: Orthopedics;  Laterality: Right;  . ENTEROCELE REPAIR  2010  . ESOPHAGEAL DILATION  [id number]  . KNEE SURGERY     x2-lt  . MASS EXCISION Right 12/21/2015   Procedure: RIGHT INDEX EXCISION MASS;  Surgeon: Leanora Cover, MD;  Location: [place];  Service: Orthopedics;  Laterality: Right;  . OTHER SURGICAL HISTORY  2001   HNP L4-5, Dr. Arnoldo Morale  . TONSILLECTOMY AND ADENOIDECTOMY  1974  . TRIGGER FINGER RELEASE Right 02/18/2014   Procedure: RELEASE A-1 PULLEY RIGHT THUMB/POSSIBLE INJ TO LEFT THUMB;  Surgeon: Cammie Sickle, MD;  Location: [place];  Service: Orthopedics;  Laterality: Right;  . TRIGGER FINGER RELEASE Left 05/16/2016   Procedure: LEFT THUMB RELEASE TRIGGER FINGER;  Surgeon: Leanora Cover, MD;  Location: [place];  Service: Orthopedics;  Laterality: Left;  Marland Kitchen VEIN LIGATION AND STRIPPING  2009    Her Family History Is Significant For: Family History  Problem Relation Age of Onset  . Adopted: Yes  . Cancer Unknown         NO FAMILY HX    Her Social History Is Significant For: Social History   Social History  . Marital status: Divorced    Spouse name: N/A  . Number of children: N/A  . Years of education: N/A   Social History Main Topics  . Smoking status: Former Smoker    Packs/day: 1.50    Years: 25.00    Types: Cigarettes    Quit date: 01/08/1993  . Smokeless tobacco: Never Used  . Alcohol use 0.0 oz/week     Comment: rarely  . Drug use: No  . Sexual activity: No     Comment: TVH   Other Topics Concern  . None   Social History Narrative  . None    Her  Allergies Are:  Allergies  Allergen Reactions  . Fentanyl Other (See Comments)    Low blood pressure  . Neosporin [Neomycin-Bacitracin Zn-Polymyx] Other (See Comments)    whelps  . Polysporin [Bacitracin-Polymyxin B] Other (See Comments)    whelps  . Tetanus Toxoids Other (See Comments)    fainting  . Versed [Midazolam] Other (See Comments)    Low blood pressure  :   Her Current Medications Are:  Outpatient Encounter Prescriptions as of 06/23/2017  Medication Sig  . fluticasone (FLONASE) 50 MCG/ACT nasal spray 2 sprays by Each Nare route daily.  Marland Kitchen ibuprofen (ADVIL,MOTRIN) 800 MG tablet Take 1 tablet (800 mg total) by mouth every 8 (  eight) hours as needed.  Marland Kitchen oxybutynin (DITROPAN XL) 10 MG 24 hr tablet Take 1 tablet (10 mg total) by mouth daily.  . pantoprazole (PROTONIX) 40 MG tablet Take 40 mg by mouth daily.  Marland Kitchen thyroid (ARMOUR THYROID) 30 MG tablet Take 30 mg by mouth daily.  . Vitamin D, Ergocalciferol, (DRISDOL) 50000 UNITS CAPS capsule take 1 capsule by mouth every week   No facility-administered encounter medications on file as of 06/23/2017.   :  Review of Systems:  Out of a complete 14 point review of systems, all are reviewed and negative with the exception of these symptoms as listed below: Review of Systems  Neurological:       Pt presents today to discuss her auto pap. Pt does not like the machine and is asking if she is a candidate for an oral appliance. Pt is struggling to find a good mask fit.    Objective:  Neurological Exam  Physical Exam Physical Examination:   Vitals:   06/23/17 1409  BP: 136/72  Pulse: 74    General Examination: The patient is a very pleasant 66 y.o. female in no acute distress. She appears well-developed and well-nourished and very well groomed.   HEENT: Normocephalic, atraumatic, pupils are equal, round and reactive to light and accommodation. Extraocular tracking is good without limitation to gaze excursion or nystagmus noted.  Normal smooth pursuit is noted. Hearing is grossly intact. Face is symmetric with normal facial animation and normal facial sensation. Speech is clear with no dysarthria noted. There is no hypophonia. There is no lip, neck/head, jaw or voice tremor. Neck with full range of active motion. Oropharynx exam reveals: mild mouth dryness, good dental hygiene and moderate airway crowding. She has a mild to moderate overbite.  Chest: Clear to auscultation without wheezing, rhonchi or crackles noted.  Heart: S1+S2+0, regular and normal without murmurs, rubs or gallops noted.   Abdomen: Soft, non-tender and non-distended with normal bowel sounds appreciated on auscultation.  Extremities: There is no pitting edema in the distal lower extremities bilaterally. Pedal pulses are intact.  Skin: Warm and dry without trophic changes noted.  Musculoskeletal: exam reveals no obvious changes.   Neurologically:  Mental status: The patient is awake, alert and oriented in all 4 spheres. Her immediate and remote memory, attention, language skills and fund of knowledge are appropriate. There is no evidence of aphasia, agnosia, apraxia or anomia. Speech is clear with normal prosody and enunciation. Thought process is linear. Mood is normal and affect is normal.  Cranial nerves II - XII are as described above under HEENT exam.  Motor exam: Normal bulk, strength and tone is noted. There is no tremor. Fine motor skills are grossly intact.  Cerebellar testing: No dysmetria or intention tremor.  Sensory exam: intact to light touch in the upper and lower extremities.  Gait, station and balance: She stands easily. No veering to one side is noted. No leaning to one side is noted. Posture is age-appropriate and stance is narrow based. Gait shows normal stride length and normal pace.   Assessment and Plan:  In summary, PHEONIX WISBY is a very pleasant 66 year old female with an underlying medical history of esophageal  stricture, hypothyroidism, pulmonary nodule, arthritis, knee surgery, status post tonsillectomy and status post low back surgery, overweight state, who presents for follow-up consultation of her obstructive sleep apnea as determined by a recent home sleep test from 04/16/2017 indicating an AHI of 7.2 per hour, average oxygen saturation 92%, nadir  of 78%. She has tried AutoPap therapy and more recently was switched her to CPAP. She has been compliant. She has noticed some benefit, in particular with respect to reducing daytime somnolence, but is struggling with the mask and pressure, finds it is still difficult to tolerate the interface and the pressure. She is commended for trying CPAP. She is at this point not able to continue consistently with this treatment. She would like to look into getting an oral appliance. She may be of reasonably good candidate for this. I suggested referral to dentistry, Dr. Augustina Mood. She is agreeable to this.  She is encouraged to continue with her CPAP until she is fully transitioned into Dr. for his care and is able to use an oral appliance successfully. At that point I can see her back on an as-needed basis. I answered all her questions today and she was in agreement.  I spent 25 minutes in total face-to-face time with the patient, more than 50% of which was spent in counseling and coordination of care, reviewing test results, reviewing medication and discussing or reviewing the diagnosis of OSA, its prognosis and treatment options. Pertinent laboratory and imaging test results that were available during this visit with the patient were reviewed by me and considered in my medical decision making (see chart for details).

## 2017-06-23 NOTE — Patient Instructions (Signed)
I appreciate you trying CPAP, as your initial treatment option for obstructive sleep apnea. As discussed, your sleep study results from your home sleep test from 04/16/17 indicated overall mild obstructive sleep apnea by number of events. Unfortunately, you have struggled with CPAP. You want to look into trying an oral appliance. I would like to make a referral to Dr. Toy Cookey at this time for you to be considered for a custom-made oral appliance for treatment of obstructive sleep apnea. We will send my records, and your sleep study results to her office and you should hear from her office directly. I will see you back at this point on an as-needed basis. I would be happy to retry with you CPAP therapy in the future if you choose to go back on CPAP treatment. Please get in touch with your DME company, [employer], about returning your CPAP machine, vs. Purchasing the CPAP from them.

## 2017-07-07 ENCOUNTER — Ambulatory Visit
Admission: RE | Admit: 2017-07-07 | Discharge: 2017-07-07 | Disposition: A | Discharge status: Home / Self Care | Payer: BLUE CROSS/BLUE SHIELD | Source: Ambulatory Visit | Attending: Neurosurgery | Admitting: Neurosurgery

## 2017-07-07 DIAGNOSIS — M5441 Lumbago with sciatica, right side: Secondary | ICD-10-CM

## 2017-07-07 MED ORDER — GADOBENATE DIMEGLUMINE 529 MG/ML IV SOLN
15.0000 mL | Freq: Once | INTRAVENOUS | Status: AC | PRN
  Administered 2017-07-07: 15 mL via INTRAVENOUS

## 2017-08-26 DIAGNOSIS — M722 Plantar fascial fibromatosis: Secondary | ICD-10-CM

## 2017-08-26 HISTORY — DX: Plantar fascial fibromatosis: M72.2

## 2017-10-07 ENCOUNTER — Telehealth: Payer: Self-pay | Admitting: Neurology

## 2017-10-07 NOTE — Telephone Encounter (Signed)
Patient is calling. She is at Dr. Corky Sing office and wants to know if she can wear CPAP and dental appliance together. Will pressure automatically be adjusted on the CPAP machine? Please call and advise.

## 2017-10-07 NOTE — Telephone Encounter (Signed)
I spoke with Dr. Rexene Alberts, she does not recommend that pt use both cpap and oral appliance together. Pt should give the oral appliance a good try, and either purchase her cpap outright, keep the cpap while trialing oral appliance (if insurance allows), or just return the cpap. It is whatever insurance allows if she wants to keep the cpap.  I called pt and advised her of this. Pt verbalized understanding.

## 2017-11-26 ENCOUNTER — Telehealth: Payer: Self-pay | Admitting: *Deleted

## 2017-11-26 NOTE — Telephone Encounter (Signed)
Patient states having vaginal dryness with intercourse and up until now has not needed any lubrication. She wants to know if there is something she can use  that will stay in vagina and add moisture? She doesn't want to use something "during the moment" unless she has to. Advised she could try Replens or maybe coconut oil but before she does, I will discuss with Dr.Miller for other suggestions. She would like to avoid Rx Estrogen cream if possible. Routed to provider

## 2017-11-26 NOTE — Telephone Encounter (Signed)
Patient is having vaginal dryness and wondering if there is a cream she can try.

## 2017-11-28 NOTE — Telephone Encounter (Signed)
Spoke with patient and notified her of Dr.Miller's recommendations below. She will try Replens for now and might consider ordering Vit E suppositories from [facility] if that doesn't help.

## 2017-11-28 NOTE — Telephone Encounter (Signed)
I agree with the Replens vaginal moisturizer or also Vit E vaginal suppositories.  [facility]ript[facility] to [facility] or she can order them for about $10 (for 24) from [employer].  She can place them vaginally two to three times weekly.  This is help moisturize and improve tissue quality.

## 2018-01-14 ENCOUNTER — Other Ambulatory Visit: Payer: Self-pay | Admitting: Plastic Surgery

## 2018-02-24 DIAGNOSIS — M722 Plantar fascial fibromatosis: Secondary | ICD-10-CM | POA: Diagnosis not present

## 2018-03-03 DIAGNOSIS — G4733 Obstructive sleep apnea (adult) (pediatric): Secondary | ICD-10-CM | POA: Diagnosis not present

## 2018-03-03 DIAGNOSIS — K635 Polyp of colon: Secondary | ICD-10-CM | POA: Diagnosis not present

## 2018-03-03 DIAGNOSIS — E7849 Other hyperlipidemia: Secondary | ICD-10-CM | POA: Diagnosis not present

## 2018-03-03 DIAGNOSIS — E038 Other specified hypothyroidism: Secondary | ICD-10-CM | POA: Diagnosis not present

## 2018-03-04 DIAGNOSIS — M859 Disorder of bone density and structure, unspecified: Secondary | ICD-10-CM | POA: Diagnosis not present

## 2018-03-04 DIAGNOSIS — E559 Vitamin D deficiency, unspecified: Secondary | ICD-10-CM | POA: Diagnosis not present

## 2018-03-04 DIAGNOSIS — E7849 Other hyperlipidemia: Secondary | ICD-10-CM | POA: Diagnosis not present

## 2018-03-04 DIAGNOSIS — E038 Other specified hypothyroidism: Secondary | ICD-10-CM | POA: Diagnosis not present

## 2018-03-06 DIAGNOSIS — L821 Other seborrheic keratosis: Secondary | ICD-10-CM | POA: Diagnosis not present

## 2018-03-06 DIAGNOSIS — D225 Melanocytic nevi of trunk: Secondary | ICD-10-CM | POA: Diagnosis not present

## 2018-03-06 DIAGNOSIS — D2372 Other benign neoplasm of skin of left lower limb, including hip: Secondary | ICD-10-CM | POA: Diagnosis not present

## 2018-03-13 DIAGNOSIS — M722 Plantar fascial fibromatosis: Secondary | ICD-10-CM | POA: Diagnosis not present

## 2018-03-16 DIAGNOSIS — M722 Plantar fascial fibromatosis: Secondary | ICD-10-CM | POA: Diagnosis not present

## 2018-03-24 DIAGNOSIS — M722 Plantar fascial fibromatosis: Secondary | ICD-10-CM | POA: Diagnosis not present

## 2018-04-22 DIAGNOSIS — M722 Plantar fascial fibromatosis: Secondary | ICD-10-CM | POA: Diagnosis not present

## 2018-05-08 ENCOUNTER — Telehealth: Payer: Self-pay | Admitting: Obstetrics & Gynecology

## 2018-05-08 NOTE — Telephone Encounter (Signed)
Left patient a message to call back to reschedule a future appointment that was cancelled by the provider for an AEX on 06/09/18.

## 2018-05-13 DIAGNOSIS — Z961 Presence of intraocular lens: Secondary | ICD-10-CM | POA: Diagnosis not present

## 2018-05-13 DIAGNOSIS — H43813 Vitreous degeneration, bilateral: Secondary | ICD-10-CM | POA: Diagnosis not present

## 2018-05-13 DIAGNOSIS — H35371 Puckering of macula, right eye: Secondary | ICD-10-CM | POA: Diagnosis not present

## 2018-05-25 DIAGNOSIS — L821 Other seborrheic keratosis: Secondary | ICD-10-CM | POA: Diagnosis not present

## 2018-06-09 ENCOUNTER — Ambulatory Visit: Payer: Self-pay | Admitting: Obstetrics & Gynecology

## 2018-06-22 ENCOUNTER — Ambulatory Visit (INDEPENDENT_AMBULATORY_CARE_PROVIDER_SITE_OTHER): Payer: 59 | Admitting: Obstetrics & Gynecology

## 2018-06-22 ENCOUNTER — Other Ambulatory Visit (HOSPITAL_COMMUNITY)
Admission: RE | Admit: 2018-06-22 | Discharge: 2018-06-22 | Disposition: A | Discharge status: Home / Self Care | Payer: 59 | Source: Ambulatory Visit | Attending: Obstetrics & Gynecology | Admitting: Obstetrics & Gynecology

## 2018-06-22 ENCOUNTER — Encounter: Payer: Self-pay | Admitting: Obstetrics & Gynecology

## 2018-06-22 ENCOUNTER — Other Ambulatory Visit: Payer: Self-pay

## 2018-06-22 VITALS — BP 126/72 | HR 68 | Resp 16 | Ht 63.5 in | Wt 166.4 lb

## 2018-06-22 DIAGNOSIS — Z0000 Encounter for general adult medical examination without abnormal findings: Secondary | ICD-10-CM

## 2018-06-22 DIAGNOSIS — Z01419 Encounter for gynecological examination (general) (routine) without abnormal findings: Secondary | ICD-10-CM | POA: Diagnosis not present

## 2018-06-22 DIAGNOSIS — Z124 Encounter for screening for malignant neoplasm of cervix: Secondary | ICD-10-CM | POA: Insufficient documentation

## 2018-06-22 DIAGNOSIS — R3129 Other microscopic hematuria: Secondary | ICD-10-CM

## 2018-06-22 LAB — POCT URINALYSIS DIPSTICK
Bilirubin, UA: NEGATIVE
GLUCOSE UA: NEGATIVE
Ketones, UA: NEGATIVE
LEUKOCYTES UA: NEGATIVE
Nitrite, UA: NEGATIVE
Protein, UA: NEGATIVE
Urobilinogen, UA: 0.2 E.U./dL
pH, UA: 5 (ref 5.0–8.0)

## 2018-06-22 MED ORDER — OXYBUTYNIN CHLORIDE ER 10 MG PO TB24: 10.0000 mg | ORAL_TABLET | Freq: Every day | ORAL | 12 refills | Status: DC

## 2018-06-22 NOTE — Progress Notes (Signed)
67 y.o. G55P2003 Divorced White or Caucasian female here for annual exam.  Went to [place] and [place] this summer.  Had great trips.  Has been experiencing issues with plantar fasciitis.  It is better but she reports this was really a big issue for her.    Did 49 and me and found a second cousin in [place].  Visited with her on [place] trip.  Thinks maybe she knows who her mother is but this person does not want contact.  Denies vaginal bleeding.    Patient's last menstrual period was 08/26/2001.          Sexually active: No.  The current method of family planning is status post hysterectomy.    Exercising: No.   Smoker:  no  Health Maintenance: Pap:  02/23/16 Neg   01/10/14 Neg   History of abnormal Pap:  no MMG: 07/04/17 [facility] faxing report. Has appt 07/09/18 Colonoscopy:  2014 Dr. Earlean Shawl.  Rare diverticulosis and internal hemorrhoids  BMD:  06/27/16 Mild Osteopenia  TDaP:  2010 Pneumonia vaccine(s):  Declines Shingrix:   Declines Hep C testing: 02/23/16 Neg Screening Labs: PCP   reports that she quit smoking about 25 years ago. Her smoking use included cigarettes. She has a 37.50 pack-year smoking history. She has never used smokeless tobacco. She reports that she drinks about 1.0 standard drinks of alcohol per week. She reports that she does not use drugs.  Past Medical History:  Diagnosis Date  . Arthritis   . Complication of anesthesia    no fent/versed  . Hiatal hernia   . Hypothyroidism   . Lung nodule    right, lower-seen on CT, had second CT 01/06/14-followed by Dr Roxan Hockey  . Plantar fasciitis 2019   Left   . PONV (postoperative nausea and vomiting)   . Rosacea   . Strabismus   . Thyroid disease   . Urinary incontinence     Past Surgical History:  Procedure Laterality Date  . ABDOMINAL HYSTERECTOMY  05/03/2002  . BACK SURGERY  2012   lumb lam  . BLEPHAROPLASTY  10/2012  . BREAST BIOPSY  1/95  . CYSTOCELE REPAIR  2007  . DISTAL INTERPHALANGEAL JOINT FUSION  Right 12/21/2015   Procedure: DEBRIDEMENT OF RIGHT DISTAL INTERPHALANGEAL JOINT ;  Surgeon: Leanora Cover, MD;  Location: [facility];  Service: Orthopedics;  Laterality: Right;  . ENTEROCELE REPAIR  2010  . ESOPHAGEAL DILATION  4098;1191  . KNEE SURGERY     x2-lt  . MASS EXCISION Right 12/21/2015   Procedure: RIGHT INDEX EXCISION MASS;  Surgeon: Leanora Cover, MD;  Location: [facility];  Service: Orthopedics;  Laterality: Right;  . OTHER SURGICAL HISTORY  2001   HNP L4-5, Dr. Arnoldo Morale  . TONSILLECTOMY AND ADENOIDECTOMY  1974  . TRIGGER FINGER RELEASE Right 02/18/2014   Procedure: RELEASE A-1 PULLEY RIGHT THUMB/POSSIBLE INJ TO LEFT THUMB;  Surgeon: Cammie Sickle, MD;  Location: [facility];  Service: Orthopedics;  Laterality: Right;  . TRIGGER FINGER RELEASE Left 05/16/2016   Procedure: LEFT THUMB RELEASE TRIGGER FINGER;  Surgeon: Leanora Cover, MD;  Location: [facility];  Service: Orthopedics;  Laterality: Left;  Marland Kitchen VEIN LIGATION AND STRIPPING  2009    Current Outpatient Medications  Medication Sig Dispense Refill  . ibuprofen (ADVIL,MOTRIN) 800 MG tablet Take 1 tablet (800 mg total) by mouth every 8 (eight) hours as needed. 40 tablet 0  . oxybutynin (DITROPAN XL) 10 MG 24 hr  tablet Take 1 tablet (10 mg total) by mouth daily. 30 tablet 12  . pantoprazole (PROTONIX) 40 MG tablet Take 40 mg by mouth daily.    Marland Kitchen thyroid (ARMOUR THYROID) 30 MG tablet Take 30 mg by mouth daily.    . Vitamin D, Ergocalciferol, (DRISDOL) 50000 UNITS CAPS capsule take 1 capsule by mouth every week 4 capsule 8   No current facility-administered medications for this visit.     Family History  Adopted: Yes  Problem Relation Age of Onset  . Cancer Unknown         NO FAMILY HX    Review of Systems  Genitourinary: Positive for frequency and urgency.       Night urination   Musculoskeletal: Positive for myalgias.  All other systems reviewed and are  negative.   Exam:   BP 126/72 (BP Location: Right Arm, Patient Position: Sitting, Cuff Size: Large)   Pulse 68   Resp 16   Ht 5' 3.5" (1.613 m)   Wt 166 lb 6.4 oz (75.5 kg)   LMP 08/26/2001   BMI 29.01 kg/m   Height: 5' 3.5" (161.3 cm)  Ht Readings from Last 3 Encounters:  06/22/18 5' 3.5" (1.613 m)  06/23/17 5\' 4"  (1.626 m)  06/06/17 5' 3.5" (1.613 m)    General appearance: alert, cooperative and appears stated age Head: Normocephalic, without obvious abnormality, atraumatic Neck: no adenopathy, supple, symmetrical, trachea midline and thyroid normal to inspection and palpation Lungs: clear to auscultation bilaterally Breasts: normal appearance, no masses or tenderness Heart: regular rate and rhythm Abdomen: soft, non-tender; bowel sounds normal; no masses,  no organomegaly Extremities: extremities normal, atraumatic, no cyanosis or edema Skin: Skin color, texture, turgor normal. No rashes or lesions Lymph nodes: Cervical, supraclavicular, and axillary nodes normal. No abnormal inguinal nodes palpated Neurologic: Grossly normal   Pelvic: External genitalia:  no lesions              Urethra:  normal appearing urethra with no masses, tenderness or lesions              Bartholins and Skenes: normal                 Vagina: normal appearing vagina with normal color and discharge, no lesions, grade 3 rectocele noted              Cervix: surgically absent              Pap taken: Yes.   Bimanual Exam:  Uterus:  Surgically absent              Adnexa: normal adnexa and no mass, fullness, tenderness               Rectovaginal: Confirms               Anus:  normal sphincter tone, no lesions  Chaperone was present for exam.  A:  Well Woman with normal exam PMP, no HRT Adopted H/O TVH with anterior repair 3/09.  Pt still desires pap smears. Microscopic hematuria  P:   Mammogram guidelines reviewed pap smear obtained today Due colonoscopy as Dr. Earlean Shawl has recommended more  frequent screening due to not having any known family hx Lab work done with Dr. Forde Dandy Declines pneumonia vaccination and shingles vaccination Rx for Ditropan XL 10mg  daily.  #30/12RF. Urine micro obtained today return annually or prn

## 2018-06-24 LAB — URINALYSIS, MICROSCOPIC ONLY
Casts: NONE SEEN /lpf
Epithelial Cells (non renal): NONE SEEN /hpf (ref 0–10)
WBC, UA: NONE SEEN /hpf (ref 0–5)

## 2018-06-24 LAB — CYTOLOGY - PAP: Diagnosis: NEGATIVE

## 2018-07-02 ENCOUNTER — Encounter (HOSPITAL_COMMUNITY): Payer: Self-pay

## 2018-07-02 ENCOUNTER — Other Ambulatory Visit: Payer: Self-pay

## 2018-07-02 ENCOUNTER — Ambulatory Visit (HOSPITAL_COMMUNITY)
Admission: EM | Admit: 2018-07-02 | Discharge: 2018-07-02 | Disposition: A | Discharge status: Home / Self Care | Payer: BLUE CROSS/BLUE SHIELD | Attending: Family Medicine | Admitting: Family Medicine

## 2018-07-02 DIAGNOSIS — M542 Cervicalgia: Secondary | ICD-10-CM

## 2018-07-02 MED ORDER — ALBUTEROL SULFATE (2.5 MG/3ML) 0.083% IN NEBU
INHALATION_SOLUTION | RESPIRATORY_TRACT | Status: AC
  Filled 2018-07-02: qty 3

## 2018-07-02 MED ORDER — MELOXICAM 7.5 MG PO TABS: 7.5000 mg | ORAL_TABLET | Freq: Every day | ORAL | 0 refills | Status: DC

## 2018-07-02 MED ORDER — METHOCARBAMOL 500 MG PO TABS: 500.0000 mg | ORAL_TABLET | Freq: Every evening | ORAL | 0 refills | Status: DC | PRN

## 2018-07-02 NOTE — Discharge Instructions (Signed)
No alarming signs on your exam. Start Mobic. Do not take ibuprofen (motrin/advil)/ naproxen (aleve) while on mobic. Robaxin as needed at night, this can make you drowsy, so do not take if you are going to drive, operate heavy machinery, or make important decisions. Ice/heat compresses as needed. This can take up to 3-4 weeks to completely resolve, but you should be feeling better each week. Follow up here or with PCP if symptoms worsen, changes for reevaluation. If experiencing decrease in grip strength, numbness/tingling in the fingers, follow up with the neurosurgery for further evaluation needed.   Head If experiencing worsening of symptoms, headache/blurry vision, nausea/vomiting, confusion/altered mental status, dizziness, weakness, passing out, imbalance, go to the emergency department for further evaluation.
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
## 2018-07-02 NOTE — ED Notes (Signed)
Bed: UC01 Expected date:  Expected time:  Means of arrival:  Comments: Appt 
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
## 2018-07-09 ENCOUNTER — Telehealth: Payer: Self-pay | Admitting: *Deleted

## 2018-07-09 DIAGNOSIS — Z1231 Encounter for screening mammogram for malignant neoplasm of breast: Secondary | ICD-10-CM | POA: Diagnosis not present

## 2018-07-09 NOTE — Telephone Encounter (Signed)
Call transferred from front office staff. Ivin Booty calling from [place], requesting order for DEXA with Vertebral Fracture Assessment, per Dr. Isaiah Blakes. Patient is at [facility] now for 2pm appointment.   Advised patient will review with Dr. Sabra Heck and return call.   Last BMD at [facility] 06/27/16: osteopenia, repeat 3-64yrs.  Reviewed with Dr. Sabra Heck, call returned to [facility]. Verbal order given, ok to proceed with BMD with VFA. Signed order faxed to [facility].   Routing to provider for final review. Patient is agreeable to disposition. Will close encounter.

## 2018-07-14 ENCOUNTER — Encounter: Payer: Self-pay | Admitting: Obstetrics & Gynecology

## 2018-08-02 IMAGING — MR MR LUMBAR SPINE WO/W CM
5 of 7 series · 29 of 48 positions shown · IV contrast (multihance)
Comparison: 08/31/2014

CLINICAL DATA: Acute low back pain with right leg numbness
beginning 2 months ago. Previous surgery.

Creatinine was obtained on site at [HOSPITAL] at [HOSPITAL].
Results: Creatinine 0.6 mg/dL.
EXAM:
MRI LUMBAR SPINE WITHOUT AND WITH CONTRAST
TECHNIQUE: Multiplanar and multiecho pulse sequences of the lumbar spine were
obtained without and with intravenous contrast.
CONTRAST:  15mL MULTIHANCE GADOBENATE DIMEGLUMINE 529 MG/ML IV SOLN

[Series 3: T1 · sagittal · 4.0mm · 0.47mm/px · 5 of 12 slices shown (1 of 2)]
[im 1/12]
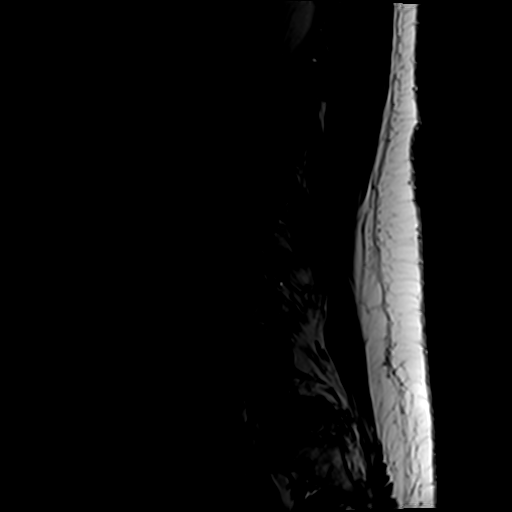
[im 3/12]
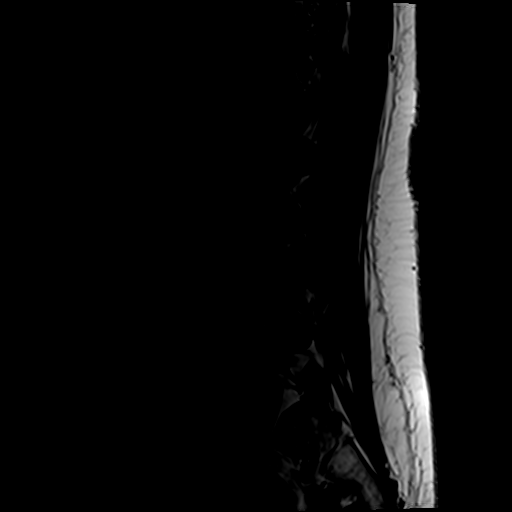
[im 6/12]
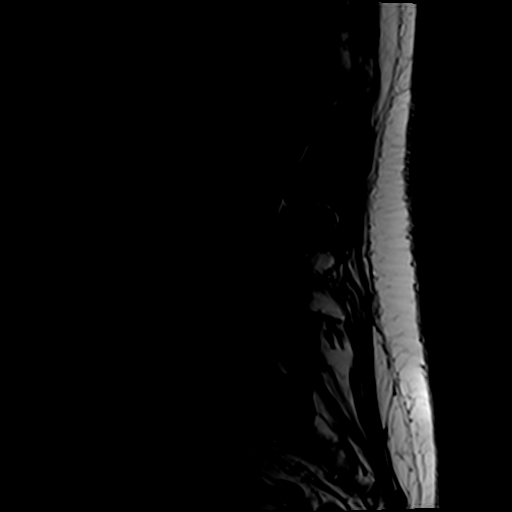
[im 9/12]
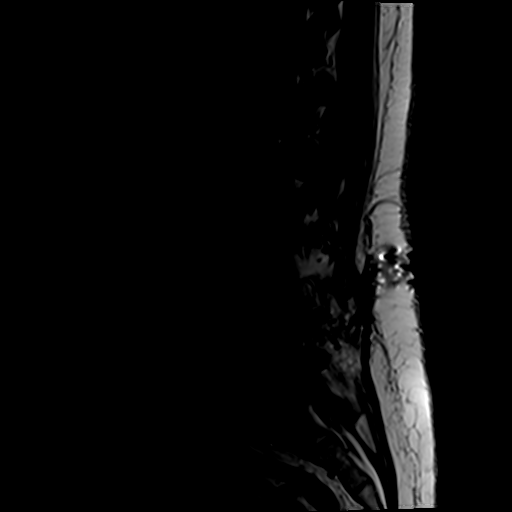
[im 12/12]
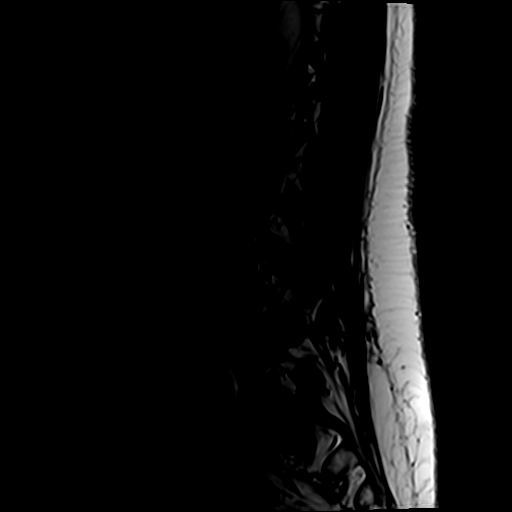

[Series 4: STIR · sagittal · 4.0mm · 0.47mm/px · 4 of 12 slices shown]
[im 1/12]
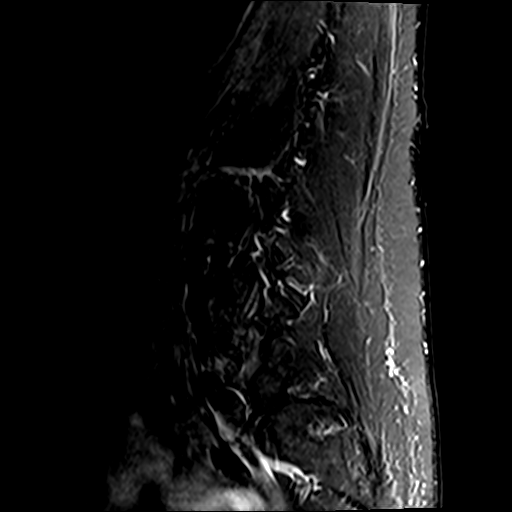
[im 3/12]
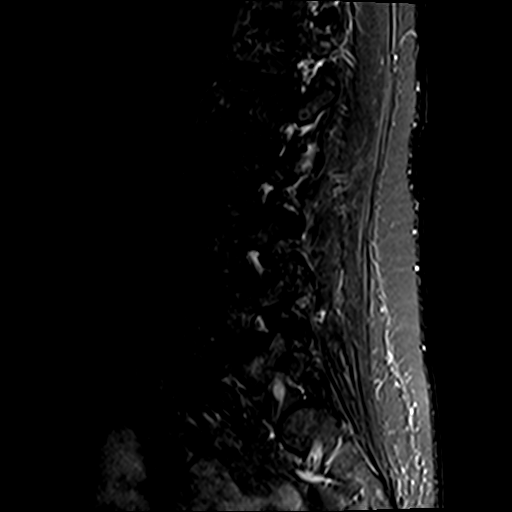
[im 6/12]
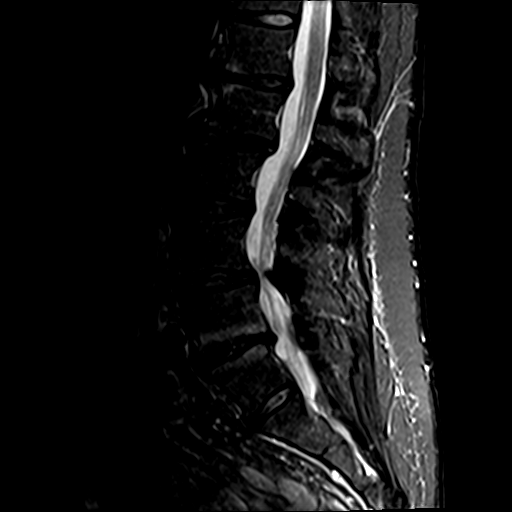
[im 9/12]
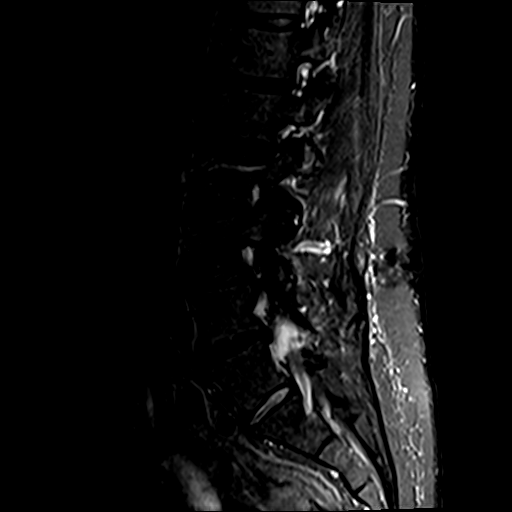

[Series 5: T1 · axial · 4.0mm · 0.74mm/px · z∈[-52,+159]mm · 8 of 29 slices shown (2 of 2)]
[im 1/29]
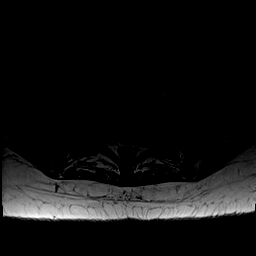
[im 4/29]
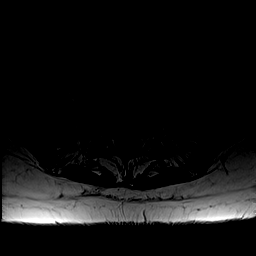
[im 10/29]
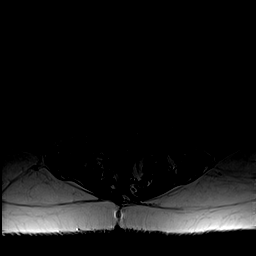
[im 13/29]
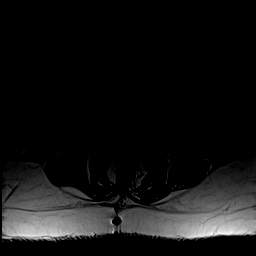
[im 16/29]
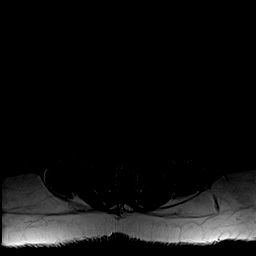
[im 19/29]
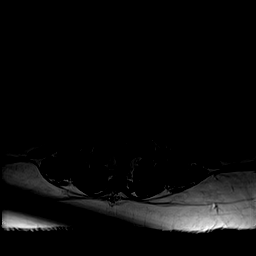
[im 25/29]
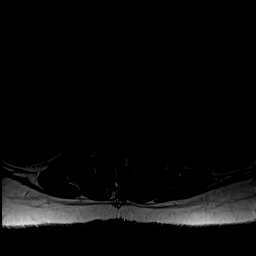
[im 29/29]
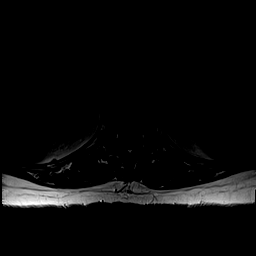

[Series 6: T2 · axial · 4.0mm · 0.74mm/px · z∈[-52,+159]mm · 8 of 29 slices shown (1 of 2)]
[im 1/29]
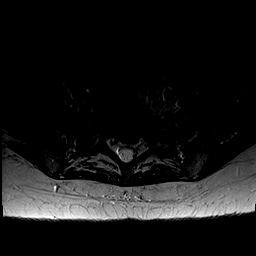
[im 4/29]
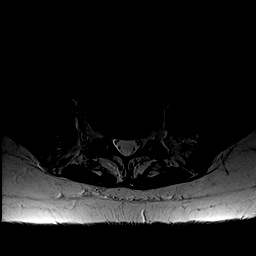
[im 10/29]
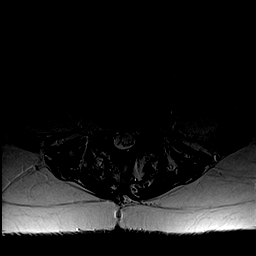
[im 13/29]
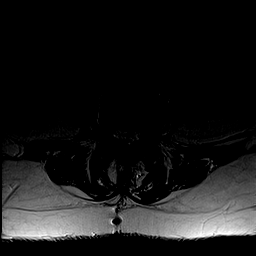
[im 16/29]
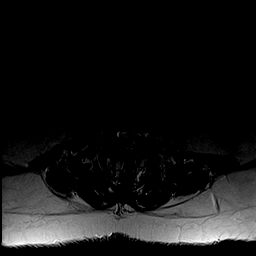
[im 19/29]
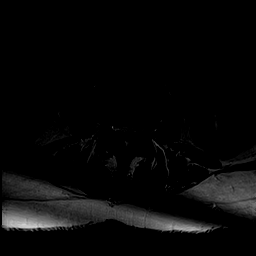
[im 25/29]
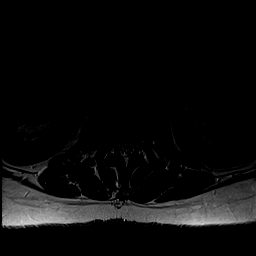
[im 29/29]
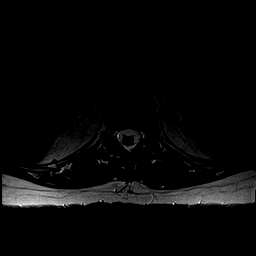

[Series 7: T2 · sagittal · 4.0mm · 0.47mm/px · 4 of 12 slices shown (2 of 2)]
[im 1/12]
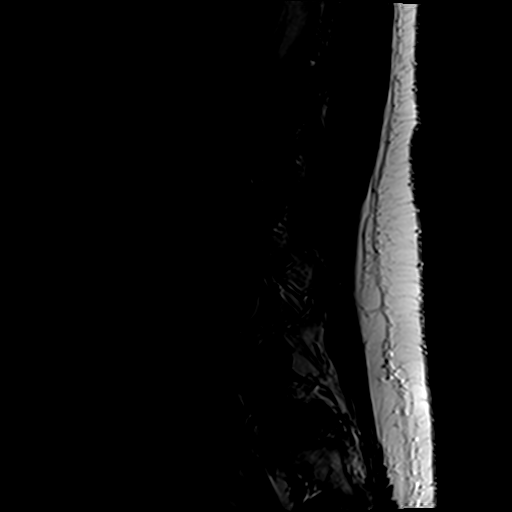
[im 4/12]
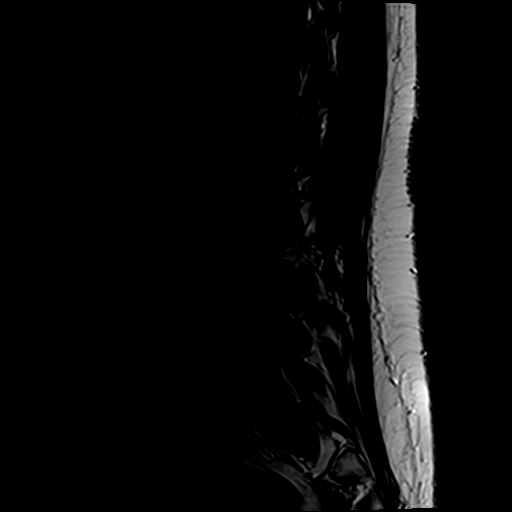
[im 8/12]
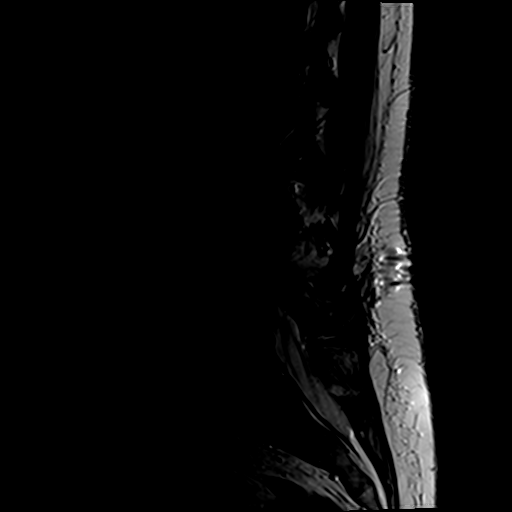
[im 12/12]
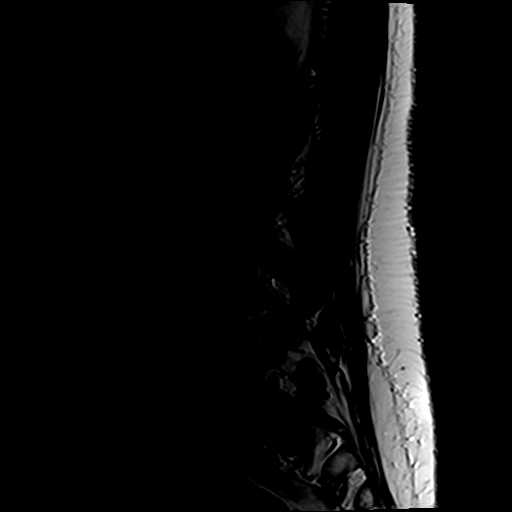

[29 of 48 positions shown; findings below may reference images not displayed]

FINDINGS: Segmentation: Consistent with the comparison exam, L5 is described
as a transitional vertebra. Correlation with this numbering scheme
would be important should intervention be contemplated.

Alignment:  3 mm retrolisthesis L1-2.  3 mm anterolisthesis L3-4.

Vertebrae: Edematous and enhancing discogenic changes on the left at
L4-5 that could be associated with back pain. Lesser endplate
changes anteriorly at T12-L1 also noted.

Conus medullaris: Extends to the L1 level and appears normal.

Paraspinal and other soft tissues: Parapelvic renal cyst on the
left. Otherwise negative.

Disc levels:

T12-L1: Disc degeneration with disc bulging but no evidence of
compressive stenosis.

L1-2: 3 mm retrolisthesis. Mild bulging of the disc. Bilateral facet
arthropathy. Mild lateral recess stenosis without visible neural
compression.

L2-3: Mild bulging of the disc. Mild facet hypertrophy. No stenosis.

L3-4: Facet arthropathy with 3 mm anterolisthesis. Bulging of the
disc. Moderate stenosis of the canal and lateral recesses could
possibly be symptomatic.

L4-5: Previous right hemilaminectomy. Disc degeneration with disc
bulging. Mild facet hypertrophy. No apparent compressive stenosis.
Discogenic edema and enhancement within the endplates could be
associated with back pain.

L5-S1: Transitional and normal.

Since the previous study, surgical changes on the right at L4-5 are
newly seen. Discogenic endplate changes at L4-5 are newly seen.
Degenerative changes at L1-2 have worsened slightly.
IMPRESSION: L1-2: Facet arthropathy allowing 3 mm retrolisthesis. Bulging of the
disc. Mild stenosis of the lateral recesses without visible neural
compression.

L3-4: Facet arthropathy lack 3 mm anterolisthesis. Bulging of the
disc. Moderate canal and lateral recess stenosis could be
symptomatic. Similar appearance to the study of 6008.

L4-5: Interval right hemilaminectomy and discectomy. No compressive
lesions seen at this level presently. Discogenic endplate edematous
and enhancing changes that could be associated with back pain.

Note that L5 is described as a transitional vertebra, consistent
with the previous exam.

## 2018-08-12 ENCOUNTER — Encounter: Payer: Self-pay | Admitting: Obstetrics & Gynecology

## 2018-09-03 DIAGNOSIS — Z6827 Body mass index (BMI) 27.0-27.9, adult: Secondary | ICD-10-CM | POA: Diagnosis not present

## 2018-09-03 DIAGNOSIS — M542 Cervicalgia: Secondary | ICD-10-CM | POA: Diagnosis not present

## 2018-09-08 DIAGNOSIS — M859 Disorder of bone density and structure, unspecified: Secondary | ICD-10-CM | POA: Diagnosis not present

## 2018-09-08 DIAGNOSIS — K635 Polyp of colon: Secondary | ICD-10-CM | POA: Diagnosis not present

## 2018-09-08 DIAGNOSIS — G4733 Obstructive sleep apnea (adult) (pediatric): Secondary | ICD-10-CM | POA: Diagnosis not present

## 2018-09-22 DIAGNOSIS — M4802 Spinal stenosis, cervical region: Secondary | ICD-10-CM | POA: Diagnosis not present

## 2018-09-22 DIAGNOSIS — M542 Cervicalgia: Secondary | ICD-10-CM | POA: Diagnosis not present

## 2018-09-22 DIAGNOSIS — M50222 Other cervical disc displacement at C5-C6 level: Secondary | ICD-10-CM | POA: Diagnosis not present

## 2018-09-22 DIAGNOSIS — M50223 Other cervical disc displacement at C6-C7 level: Secondary | ICD-10-CM | POA: Diagnosis not present

## 2018-09-25 DIAGNOSIS — N390 Urinary tract infection, site not specified: Secondary | ICD-10-CM | POA: Diagnosis not present

## 2018-09-25 DIAGNOSIS — R82998 Other abnormal findings in urine: Secondary | ICD-10-CM | POA: Diagnosis not present

## 2019-06-26 ENCOUNTER — Other Ambulatory Visit: Payer: Self-pay | Admitting: Obstetrics & Gynecology

## 2019-06-28 NOTE — Telephone Encounter (Signed)
Medication refill request: Ditropan  Last AEX:  06/22/18 Next AEX: 10/29/19 Last MMG (if hormonal medication request): 07/09/18 Bi-rads 1 neg  Refill authorized: #30 with 5 rf

## 2019-07-19 ENCOUNTER — Encounter: Payer: Self-pay | Admitting: Obstetrics & Gynecology

## 2019-10-03 ENCOUNTER — Ambulatory Visit: Payer: 59

## 2019-10-03 DIAGNOSIS — Z86010 Personal history of colonic polyps: Secondary | ICD-10-CM | POA: Insufficient documentation

## 2019-10-03 DIAGNOSIS — K5790 Diverticulosis of intestine, part unspecified, without perforation or abscess without bleeding: Secondary | ICD-10-CM | POA: Insufficient documentation

## 2019-10-18 ENCOUNTER — Ambulatory Visit: Payer: 59

## 2019-10-29 ENCOUNTER — Encounter: Payer: Self-pay | Admitting: Obstetrics & Gynecology

## 2019-10-29 ENCOUNTER — Ambulatory Visit: Payer: 59 | Admitting: Obstetrics & Gynecology

## 2019-10-29 ENCOUNTER — Other Ambulatory Visit: Payer: Self-pay

## 2019-10-29 VITALS — BP 122/70 | HR 68 | Temp 95.5°F | Resp 10 | Ht 63.0 in | Wt 157.0 lb

## 2019-10-29 DIAGNOSIS — Z01419 Encounter for gynecological examination (general) (routine) without abnormal findings: Secondary | ICD-10-CM | POA: Diagnosis not present

## 2019-10-29 MED ORDER — OXYBUTYNIN CHLORIDE ER 10 MG PO TB24: 10.0000 mg | ORAL_TABLET | Freq: Every day | ORAL | 4 refills | Status: DC
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
## 2019-10-29 NOTE — Patient Instructions (Signed)
SAME DAY APPOINTMENT To schedule an appointment, call [facility], [place] [address]

## 2019-10-31 ENCOUNTER — Encounter: Payer: Self-pay | Admitting: Obstetrics & Gynecology

## 2019-11-03 ENCOUNTER — Other Ambulatory Visit: Payer: Self-pay

## 2019-11-03 DIAGNOSIS — R911 Solitary pulmonary nodule: Secondary | ICD-10-CM

## 2019-11-03 DIAGNOSIS — R918 Other nonspecific abnormal finding of lung field: Secondary | ICD-10-CM

## 2019-11-10 ENCOUNTER — Other Ambulatory Visit: Payer: Self-pay | Admitting: Thoracic Surgery (Cardiothoracic Vascular Surgery)

## 2019-12-06 ENCOUNTER — Other Ambulatory Visit: Payer: 59

## 2019-12-07 ENCOUNTER — Ambulatory Visit: Payer: 59 | Admitting: Thoracic Surgery (Cardiothoracic Vascular Surgery)

## 2020-01-03 ENCOUNTER — Ambulatory Visit
Admission: RE | Admit: 2020-01-03 | Discharge: 2020-01-03 | Disposition: A | Discharge status: Home / Self Care | Payer: PPO | Source: Ambulatory Visit | Attending: Thoracic Surgery (Cardiothoracic Vascular Surgery) | Admitting: Thoracic Surgery (Cardiothoracic Vascular Surgery)

## 2020-01-03 DIAGNOSIS — R918 Other nonspecific abnormal finding of lung field: Secondary | ICD-10-CM | POA: Diagnosis not present

## 2020-01-03 DIAGNOSIS — R911 Solitary pulmonary nodule: Secondary | ICD-10-CM

## 2020-01-04 ENCOUNTER — Encounter: Payer: Self-pay | Admitting: Thoracic Surgery (Cardiothoracic Vascular Surgery)

## 2020-01-04 ENCOUNTER — Ambulatory Visit: Payer: PPO | Admitting: Thoracic Surgery (Cardiothoracic Vascular Surgery)

## 2020-01-04 ENCOUNTER — Other Ambulatory Visit: Payer: Self-pay

## 2020-01-04 VITALS — BP 159/85 | HR 66 | Temp 97.0°F | Resp 16 | Ht 63.0 in | Wt 154.7 lb

## 2020-01-04 DIAGNOSIS — R911 Solitary pulmonary nodule: Secondary | ICD-10-CM

## 2020-01-04 NOTE — Progress Notes (Signed)
Kara Mitchell       Kara Mitchell,Kara Mitchell             [id number]     HPI: Kara Mitchell returns today for follow-up of lung nodules  Kara Mitchell is a 69 year old former smoker who was found to have a 2.5 mm nodule in the superior segment of the left lower lobe in 2014.  This appeared to be a subpleural lymph node.  We follow that and it did not change over 2 years.  I saw her again in 2018.  She had a CT which showed stable nodules.  She now returns with a follow-up scan.  She has been feeling well.  She quit smoking in 1999.  She had a 37.5-pack-year history prior to that.  She has not had any recent respiratory issues.  Past Medical History:  Diagnosis Date  . Arthritis   . Complication of anesthesia    no fent/versed  . Hiatal hernia   . Hypothyroidism   . Lung nodule    right, lower-seen on CT, had second CT 01/06/14-followed by Dr Roxan Hockey  . Plantar fasciitis 2019   Left   . PONV (postoperative nausea and vomiting)   . Rosacea   . Strabismus   . Thyroid disease   . Urinary incontinence     Current Outpatient Medications  Medication Sig Dispense Refill  . ibuprofen (ADVIL,MOTRIN) 800 MG tablet Take 1 tablet (800 mg total) by mouth every 8 (eight) hours as needed. 40 tablet 0  . LATISSE 0.03 % ophthalmic solution Instill 1 drop via applicator once a day    . meloxicam (MOBIC) 7.5 MG tablet Take 1 tablet (7.5 mg total) by mouth daily. 15 tablet 0  . oxybutynin (DITROPAN-XL) 10 MG 24 hr tablet Take 1 tablet (10 mg total) by mouth daily. 90 tablet 4  . pantoprazole (PROTONIX) 40 MG tablet Take 40 mg by mouth daily.    Kara Mitchell Kitchen thyroid (ARMOUR THYROID) 30 MG tablet Take 30 mg by mouth daily.    . Vitamin D, Ergocalciferol, (DRISDOL) 50000 UNITS CAPS capsule take 1 capsule by mouth every week 4 capsule 8   No current facility-administered medications for this visit.    Physical Exam BP (!) 159/85 (BP Location: Right Arm, Patient Position: Sitting, Cuff  Size: Normal)   Pulse 66   Temp (!) 97 F (36.1 C)   Resp 16   Ht 5\' 3"  (1.6 m)   Wt 154 lb 11.2 oz (70.2 kg)   LMP 08/26/2001   SpO2 97% Comment: RA  BMI 27.6 kg/m  69 year old woman in no acute distress Alert and oriented x3 with no focal deficits Lungs clear with equal breath sounds bilaterally Cardiac regular rate and rhythm No clubbing, cyanosis, or edema  Diagnostic Tests: CT CHEST WITHOUT CONTRAST  TECHNIQUE: Multidetector CT imaging of the chest was performed following the standard protocol without IV contrast.  COMPARISON:  03/04/2017  FINDINGS: Cardiovascular: The heart size appears within normal limits. No pericardial effusion. Mild aortic atherosclerosis.  Mediastinum/Nodes: Normal appearance of the thyroid gland. The trachea appears patent and is midline. Normal appearance of the esophagus. No mediastinal or hilar adenopathy.  Lungs/Pleura: No pleural effusion.  Subpleural nodule within the superior segment of the left lower lobe is unchanged measuring 2.6 mm, image 45/8.  There is a calcified granuloma in the lateral left lung base.  Right upper lobe 3 mm ground-glass nodule is unchanged from previous exam, image 20/8. This  is compatible with a benign abnormality. Directly adjacent to this nodule is a new 3 mm nodule, image 64/4.  Upper Abdomen: No acute abnormality  Musculoskeletal: No chest wall mass or suspicious bone lesions identified.  IMPRESSION: 1. New 3 mm solid nodule is identified within the right upper lobe. Non nonspecific. No follow-up needed if patient is low-risk. Non-contrast chest CT can be considered in 12 months if patient is high-risk. This recommendation follows the consensus statement: Guidelines for Management of Incidental Pulmonary Nodules Detected on CT Images: From the Fleischner Society 2017; Radiology 2017; 284:228-243. 2. Additional, less than 5 mm, solid and non solid nodules are stable from  03/04/2017 compatible with a benign process.  Aortic Atherosclerosis (ICD10-I70.0).   Electronically Signed   By: Kerby Moors M.D.   On: 01/03/2020 09:30 I personally reviewed the CT images and concur with the findings noted above  Impression: Kara Mitchell is a 69 year old former smoker who has had known lung nodules for many years.  Lung nodules-today's CT shows a new 3 mm nodule in the right upper lobe.  This is small and nonspecific.  It most likely is benign.  We will repeat a CT in a year to make sure there is no change in that nodule.  Her other nodules are stable.  Tobacco abuse-quit in 1999.  Plan: Return in 1 year with CT chest  [name], MD [facility] [phone]

## 2020-02-04 DIAGNOSIS — M542 Cervicalgia: Secondary | ICD-10-CM | POA: Diagnosis not present

## 2020-02-04 DIAGNOSIS — R030 Elevated blood-pressure reading, without diagnosis of hypertension: Secondary | ICD-10-CM | POA: Diagnosis not present

## 2020-02-04 DIAGNOSIS — Z6826 Body mass index (BMI) 26.0-26.9, adult: Secondary | ICD-10-CM | POA: Diagnosis not present

## 2020-02-11 DIAGNOSIS — M2569 Stiffness of other specified joint, not elsewhere classified: Secondary | ICD-10-CM | POA: Diagnosis not present

## 2020-02-11 DIAGNOSIS — M542 Cervicalgia: Secondary | ICD-10-CM | POA: Diagnosis not present

## 2020-02-11 DIAGNOSIS — R531 Weakness: Secondary | ICD-10-CM | POA: Diagnosis not present

## 2020-02-11 DIAGNOSIS — R293 Abnormal posture: Secondary | ICD-10-CM | POA: Diagnosis not present

## 2020-02-14 DIAGNOSIS — R293 Abnormal posture: Secondary | ICD-10-CM | POA: Diagnosis not present

## 2020-02-14 DIAGNOSIS — R531 Weakness: Secondary | ICD-10-CM | POA: Diagnosis not present

## 2020-02-14 DIAGNOSIS — M542 Cervicalgia: Secondary | ICD-10-CM | POA: Diagnosis not present

## 2020-02-14 DIAGNOSIS — M2569 Stiffness of other specified joint, not elsewhere classified: Secondary | ICD-10-CM | POA: Diagnosis not present

## 2020-02-16 ENCOUNTER — Emergency Department (HOSPITAL_BASED_OUTPATIENT_CLINIC_OR_DEPARTMENT_OTHER)
Admission: EM | Admit: 2020-02-16 | Discharge: 2020-02-16 | Disposition: A | Discharge status: Home / Self Care | Payer: PPO | Attending: Emergency Medicine | Admitting: Emergency Medicine

## 2020-02-16 ENCOUNTER — Encounter (HOSPITAL_BASED_OUTPATIENT_CLINIC_OR_DEPARTMENT_OTHER): Payer: Self-pay | Admitting: *Deleted

## 2020-02-16 ENCOUNTER — Emergency Department (HOSPITAL_BASED_OUTPATIENT_CLINIC_OR_DEPARTMENT_OTHER): Payer: PPO

## 2020-02-16 ENCOUNTER — Other Ambulatory Visit: Payer: Self-pay

## 2020-02-16 DIAGNOSIS — E039 Hypothyroidism, unspecified: Secondary | ICD-10-CM | POA: Insufficient documentation

## 2020-02-16 DIAGNOSIS — G454 Transient global amnesia: Secondary | ICD-10-CM | POA: Diagnosis not present

## 2020-02-16 DIAGNOSIS — Z87891 Personal history of nicotine dependence: Secondary | ICD-10-CM | POA: Insufficient documentation

## 2020-02-16 DIAGNOSIS — R413 Other amnesia: Secondary | ICD-10-CM | POA: Diagnosis not present

## 2020-02-16 DIAGNOSIS — Z79899 Other long term (current) drug therapy: Secondary | ICD-10-CM | POA: Diagnosis not present

## 2020-02-16 LAB — COMPREHENSIVE METABOLIC PANEL
ALT: 18 U/L (ref 0–44)
AST: 19 U/L (ref 15–41)
Albumin: 4.8 g/dL (ref 3.5–5.0)
Alkaline Phosphatase: 72 U/L (ref 38–126)
Anion gap: 10 (ref 5–15)
BUN: 9 mg/dL (ref 8–23)
CO2: 26 mmol/L (ref 22–32)
Calcium: 9.4 mg/dL (ref 8.9–10.3)
Chloride: 104 mmol/L (ref 98–111)
Creatinine, Ser: 0.56 mg/dL (ref 0.44–1.00)
GFR calc Af Amer: >60 mL/min (ref >60)
GFR calc non Af Amer: >60 mL/min (ref >60)
Glucose, Bld: 103 mg/dL — ABNORMAL HIGH (ref 70–99)
Potassium: 4.5 mmol/L (ref 3.5–5.1)
Sodium: 140 mmol/L (ref 135–145)
Total Bilirubin: 0.7 mg/dL (ref 0.3–1.2)
Total Protein: 7.2 g/dL (ref 6.5–8.1)

## 2020-02-16 LAB — CBC WITH DIFFERENTIAL/PLATELET
Abs Immature Granulocytes: 0.01 10*3/uL (ref 0.00–0.07)
Basophils Absolute: 0 10*3/uL (ref 0.0–0.1)
Basophils Relative: 1 %
Eosinophils Absolute: 0.1 10*3/uL (ref 0.0–0.5)
Eosinophils Relative: 1 %
HCT: 43.2 % (ref 36.0–46.0)
Hemoglobin: 14 g/dL (ref 12.0–15.0)
Immature Granulocytes: 0 %
Lymphocytes Relative: 19 %
Lymphs Abs: 1.3 10*3/uL (ref 0.7–4.0)
MCH: 30.4 pg (ref 26.0–34.0)
MCHC: 32.4 g/dL (ref 30.0–36.0)
MCV: 93.7 fL (ref 80.0–100.0)
Monocytes Absolute: 0.5 10*3/uL (ref 0.1–1.0)
Monocytes Relative: 7 %
Neutro Abs: 4.9 10*3/uL (ref 1.7–7.7)
Neutrophils Relative %: 72 %
Platelets: 240 10*3/uL (ref 150–400)
RBC: 4.61 MIL/uL (ref 3.87–5.11)
RDW: 12.5 % (ref 11.5–15.5)
WBC: 6.8 10*3/uL (ref 4.0–10.5)
nRBC: 0 % (ref 0.0–0.2)

## 2020-02-16 NOTE — ED Provider Notes (Signed)
[facility] EMERGENCY DEPARTMENT Provider Note   CSN: [id number] Arrival date & time: 02/16/20  1754     History Chief Complaint  Patient presents with  . Memory Loss    Kara Mitchell is a 69 y.o. female history of plantar fasciitis here presenting with trouble with her memory.  Patient states that she was told that her dog has metastatic cancer.  She decided that she will need to put the dog down.  After she left the vet, she was very upset.  She states that she had lapses of her memory and had sent text that she did not remember.  She states that her memory is back now and she denies any hallucinations or suicidal homicidal ideations.  Denies any trouble speaking or weakness.  The history is provided by the patient.       Past Medical History:  Diagnosis Date  . Arthritis   . Complication of anesthesia    no fent/versed  . Hiatal hernia   . Hypothyroidism   . Lung nodule    right, lower-seen on CT, had second CT 01/06/14-followed by Dr Roxan Hockey  . Plantar fasciitis 2019   Left   . PONV (postoperative nausea and vomiting)   . Rosacea   . Strabismus   . Thyroid disease   . Urinary incontinence     Patient Active Problem List   Diagnosis Date Noted  . Diverticulosis 10/03/2019  . History of adenomatous polyp of colon 10/03/2019  . OSA on CPAP 06/06/2017  . GERD (gastroesophageal reflux disease) 02/17/2017  . Osteoarthritis of finger of right hand 01/29/2016  . Incidental lung nodule, less than or equal to 47mm 07/12/2014  . Blepharoptosis 08/31/2012    Past Surgical History:  Procedure Laterality Date  . BACK SURGERY  2012   lumb lam  . BLEPHAROPLASTY  10/2012  . BREAST BIOPSY  1/95  . CYSTOCELE REPAIR  2007  . DISTAL INTERPHALANGEAL JOINT FUSION Right 12/21/2015   Procedure: DEBRIDEMENT OF RIGHT DISTAL INTERPHALANGEAL JOINT ;  Surgeon: Leanora Cover, MD;  Location: [facility];  Service: Orthopedics;  Laterality: Right;  . ENTEROCELE  REPAIR  2010  . ESOPHAGEAL DILATION  [id number]  . KNEE SURGERY     x2-lt  . MASS EXCISION Right 12/21/2015   Procedure: RIGHT INDEX EXCISION MASS;  Surgeon: Leanora Cover, MD;  Location: [facility];  Service: Orthopedics;  Laterality: Right;  . OTHER SURGICAL HISTORY  2001   HNP L4-5, Dr. Arnoldo Morale  . TONSILLECTOMY AND ADENOIDECTOMY  1974  . TRIGGER FINGER RELEASE Right 02/18/2014   Procedure: RELEASE A-1 PULLEY RIGHT THUMB/POSSIBLE INJ TO LEFT THUMB;  Surgeon: Cammie Sickle, MD;  Location: [facility];  Service: Orthopedics;  Laterality: Right;  . TRIGGER FINGER RELEASE Left 05/16/2016   Procedure: LEFT THUMB RELEASE TRIGGER FINGER;  Surgeon: Leanora Cover, MD;  Location: [facility];  Service: Orthopedics;  Laterality: Left;  Marland Kitchen VAGINAL HYSTERECTOMY  05/03/2002  . VEIN LIGATION AND STRIPPING  2009     OB History    Gravida  2   Para  2   Term  2   Preterm      AB      Living  3     SAB      TAB      Ectopic      Multiple  1   Live Births  3  Family History  Adopted: Yes  Problem Relation Age of Onset  . Cancer Other         NO FAMILY HX    Social History   Tobacco Use  . Smoking status: Former Smoker    Packs/day: 1.50    Years: 25.00    Pack years: 37.50    Types: Cigarettes    Quit date: 01/08/1993    Years since quitting: 27.1  . Smokeless tobacco: Never Used  Vaping Use  . Vaping Use: Never used  Substance Use Topics  . Alcohol use: Yes    Alcohol/week: 1.0 standard drink    Types: 1 Standard drinks or equivalent per week    Comment: rarely  . Drug use: No    Home Medications Prior to Admission medications   Medication Sig Start Date End Date Taking? Authorizing Provider  ibuprofen (ADVIL,MOTRIN) 800 MG tablet Take 1 tablet (800 mg total) by mouth every 8 (eight) hours as needed. 05/16/16   Leanora Cover, MD  LATISSE 0.03 % ophthalmic solution Instill 1 drop via applicator once a day  1/61/09   [provider]  meloxicam (MOBIC) 7.5 MG tablet Take 1 tablet (7.5 mg total) by mouth daily. 07/02/18   Tasia Catchings, Amy V, PA-C  oxybutynin (DITROPAN-XL) 10 MG 24 hr tablet Take 1 tablet (10 mg total) by mouth daily. 10/29/19   Megan Salon, MD  pantoprazole (PROTONIX) 40 MG tablet Take 40 mg by mouth daily.    [provider]  thyroid (ARMOUR THYROID) 30 MG tablet Take 30 mg by mouth daily.    [provider]  Vitamin D, Ergocalciferol, (DRISDOL) 50000 UNITS CAPS capsule take 1 capsule by mouth every week    Megan Salon, MD    Allergies    Fentanyl, Neosporin [neomycin-bacitracin zn-polymyx], Polysporin [bacitracin-polymyxin b], Tetanus toxoids, and Versed [midazolam]  Review of Systems   Review of Systems  Psychiatric/Behavioral: Positive for confusion.  All other systems reviewed and are negative.   Physical Exam Updated Vital Signs BP (!) 168/116   Pulse 100   Temp 98.3 F (36.8 C) (Oral)   Resp 18   Ht 5\' 4"  (1.626 m)   Wt 68.9 kg   LMP 08/26/2001   SpO2 97%   BMI 26.09 kg/m   Physical Exam Vitals and nursing note reviewed.  HENT:     Head: Normocephalic.     Nose: Nose normal.     Mouth/Throat:     Mouth: Mucous membranes are moist.  Eyes:     Extraocular Movements: Extraocular movements intact.     Pupils: Pupils are equal, round, and reactive to light.  Cardiovascular:     Rate and Rhythm: Normal rate and regular rhythm.     Pulses: Normal pulses.  Pulmonary:     Effort: Pulmonary effort is normal.     Breath sounds: Normal breath sounds.  Abdominal:     General: Abdomen is flat.     Palpations: Abdomen is soft.  Musculoskeletal:        General: Normal range of motion.     Cervical back: Normal range of motion.  Skin:    General: Skin is warm.     Capillary Refill: Capillary refill takes less than 2 seconds.  Neurological:     General: No focal deficit present.     Mental Status: She is alert and oriented to person,  place, and time.     Comments: CN 2- 12 intact, nl strength throughout, nl  gait, nl sensation. Nl finger to nose   Psychiatric:        Mood and Affect: Mood normal.     ED Results / Procedures / Treatments   Labs (all labs ordered are listed, but only abnormal results are displayed) Labs Reviewed  COMPREHENSIVE METABOLIC PANEL - Abnormal; Notable for the following components:      Result Value   Glucose, Bld 103 (*)    All other components within normal limits  CBC WITH DIFFERENTIAL/PLATELET    EKG None  Radiology CT Head Wo Contrast  Result Date: 02/16/2020 CLINICAL DATA:  Memory loss EXAM: CT HEAD WITHOUT CONTRAST TECHNIQUE: Contiguous axial images were obtained from the base of the skull through the vertex without intravenous contrast. COMPARISON:  None. FINDINGS: Brain: No evidence of acute infarction, hemorrhage, hydrocephalus, extra-axial collection or mass lesion/mass effect. Vascular: No hyperdense vessel or unexpected calcification. Skull: No osseous abnormality. Sinuses/Orbits: Visualized paranasal sinuses are clear. Visualized mastoid sinuses are clear. Visualized orbits demonstrate no focal abnormality. Other: None IMPRESSION: No acute intracranial pathology. Electronically Signed   By: Kathreen Devoid   On: 02/16/2020 18:50    Procedures Procedures (including critical care time)  Medications Ordered in ED Medications - No data to display  ED Course  I have reviewed the triage vital signs and the nursing notes.  Pertinent labs & imaging results that were available during my care of the patient were reviewed by me and considered in my medical decision making (see chart for details).    MDM Rules/Calculators/A&P                          Kara Mitchell is a 69 y.o. female who presented with forgetfulness.  There is no slurred speech and patient's neuro exam is unremarkable.  Patient CT head is unremarkable and labs were normal.  I think patient likely has transient  global amnesia due to stress.  Since she is back to baseline, she is stable for discharge.  Final Clinical Impression(s) / ED Diagnoses Final diagnoses:  None    Rx / DC Orders ED Discharge Orders    None       Drenda Freeze, MD 02/16/20 2044
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
## 2020-02-16 NOTE — ED Triage Notes (Signed)
Coworker states pt became forgetful and short term  memory loss that started at 1pm today, denies slurred speech. Pt states under stress. Denies h/a

## 2020-02-21 DIAGNOSIS — R293 Abnormal posture: Secondary | ICD-10-CM | POA: Diagnosis not present

## 2020-02-21 DIAGNOSIS — M542 Cervicalgia: Secondary | ICD-10-CM | POA: Diagnosis not present

## 2020-02-21 DIAGNOSIS — R531 Weakness: Secondary | ICD-10-CM | POA: Diagnosis not present

## 2020-02-21 DIAGNOSIS — M2569 Stiffness of other specified joint, not elsewhere classified: Secondary | ICD-10-CM | POA: Diagnosis not present

## 2020-02-23 DIAGNOSIS — R293 Abnormal posture: Secondary | ICD-10-CM | POA: Diagnosis not present

## 2020-02-23 DIAGNOSIS — R531 Weakness: Secondary | ICD-10-CM | POA: Diagnosis not present

## 2020-02-23 DIAGNOSIS — M2569 Stiffness of other specified joint, not elsewhere classified: Secondary | ICD-10-CM | POA: Diagnosis not present

## 2020-02-23 DIAGNOSIS — M542 Cervicalgia: Secondary | ICD-10-CM | POA: Diagnosis not present

## 2020-02-25 DIAGNOSIS — M2569 Stiffness of other specified joint, not elsewhere classified: Secondary | ICD-10-CM | POA: Diagnosis not present

## 2020-02-25 DIAGNOSIS — M542 Cervicalgia: Secondary | ICD-10-CM | POA: Diagnosis not present

## 2020-02-25 DIAGNOSIS — R293 Abnormal posture: Secondary | ICD-10-CM | POA: Diagnosis not present

## 2020-02-25 DIAGNOSIS — R531 Weakness: Secondary | ICD-10-CM | POA: Diagnosis not present

## 2020-03-01 DIAGNOSIS — K222 Esophageal obstruction: Secondary | ICD-10-CM | POA: Diagnosis not present

## 2020-03-01 DIAGNOSIS — E7849 Other hyperlipidemia: Secondary | ICD-10-CM | POA: Diagnosis not present

## 2020-03-01 DIAGNOSIS — M542 Cervicalgia: Secondary | ICD-10-CM | POA: Diagnosis not present

## 2020-03-01 DIAGNOSIS — G454 Transient global amnesia: Secondary | ICD-10-CM | POA: Diagnosis not present

## 2020-03-01 DIAGNOSIS — M2569 Stiffness of other specified joint, not elsewhere classified: Secondary | ICD-10-CM | POA: Diagnosis not present

## 2020-03-01 DIAGNOSIS — E559 Vitamin D deficiency, unspecified: Secondary | ICD-10-CM | POA: Diagnosis not present

## 2020-03-01 DIAGNOSIS — J984 Other disorders of lung: Secondary | ICD-10-CM | POA: Diagnosis not present

## 2020-03-01 DIAGNOSIS — M859 Disorder of bone density and structure, unspecified: Secondary | ICD-10-CM | POA: Diagnosis not present

## 2020-03-01 DIAGNOSIS — E038 Other specified hypothyroidism: Secondary | ICD-10-CM | POA: Diagnosis not present

## 2020-03-01 DIAGNOSIS — R293 Abnormal posture: Secondary | ICD-10-CM | POA: Diagnosis not present

## 2020-03-01 DIAGNOSIS — I700 Atherosclerosis of aorta: Secondary | ICD-10-CM | POA: Diagnosis not present

## 2020-03-01 DIAGNOSIS — K635 Polyp of colon: Secondary | ICD-10-CM | POA: Diagnosis not present

## 2020-03-01 DIAGNOSIS — G4733 Obstructive sleep apnea (adult) (pediatric): Secondary | ICD-10-CM | POA: Diagnosis not present

## 2020-03-01 DIAGNOSIS — R531 Weakness: Secondary | ICD-10-CM | POA: Diagnosis not present

## 2020-03-03 DIAGNOSIS — R293 Abnormal posture: Secondary | ICD-10-CM | POA: Diagnosis not present

## 2020-03-03 DIAGNOSIS — M2569 Stiffness of other specified joint, not elsewhere classified: Secondary | ICD-10-CM | POA: Diagnosis not present

## 2020-03-03 DIAGNOSIS — M542 Cervicalgia: Secondary | ICD-10-CM | POA: Diagnosis not present

## 2020-03-03 DIAGNOSIS — R531 Weakness: Secondary | ICD-10-CM | POA: Diagnosis not present

## 2020-03-06 DIAGNOSIS — R531 Weakness: Secondary | ICD-10-CM | POA: Diagnosis not present

## 2020-03-06 DIAGNOSIS — M542 Cervicalgia: Secondary | ICD-10-CM | POA: Diagnosis not present

## 2020-03-06 DIAGNOSIS — M2569 Stiffness of other specified joint, not elsewhere classified: Secondary | ICD-10-CM | POA: Diagnosis not present

## 2020-03-06 DIAGNOSIS — R293 Abnormal posture: Secondary | ICD-10-CM | POA: Diagnosis not present

## 2020-03-08 DIAGNOSIS — M2569 Stiffness of other specified joint, not elsewhere classified: Secondary | ICD-10-CM | POA: Diagnosis not present

## 2020-03-08 DIAGNOSIS — R531 Weakness: Secondary | ICD-10-CM | POA: Diagnosis not present

## 2020-03-08 DIAGNOSIS — R293 Abnormal posture: Secondary | ICD-10-CM | POA: Diagnosis not present

## 2020-03-08 DIAGNOSIS — M542 Cervicalgia: Secondary | ICD-10-CM | POA: Diagnosis not present

## 2020-03-13 DIAGNOSIS — R293 Abnormal posture: Secondary | ICD-10-CM | POA: Diagnosis not present

## 2020-03-13 DIAGNOSIS — M542 Cervicalgia: Secondary | ICD-10-CM | POA: Diagnosis not present

## 2020-03-13 DIAGNOSIS — M2569 Stiffness of other specified joint, not elsewhere classified: Secondary | ICD-10-CM | POA: Diagnosis not present

## 2020-03-13 DIAGNOSIS — R531 Weakness: Secondary | ICD-10-CM | POA: Diagnosis not present

## 2020-03-15 DIAGNOSIS — R531 Weakness: Secondary | ICD-10-CM | POA: Diagnosis not present

## 2020-03-15 DIAGNOSIS — M542 Cervicalgia: Secondary | ICD-10-CM | POA: Diagnosis not present

## 2020-03-15 DIAGNOSIS — R293 Abnormal posture: Secondary | ICD-10-CM | POA: Diagnosis not present

## 2020-03-15 DIAGNOSIS — M2569 Stiffness of other specified joint, not elsewhere classified: Secondary | ICD-10-CM | POA: Diagnosis not present

## 2020-03-17 DIAGNOSIS — R293 Abnormal posture: Secondary | ICD-10-CM | POA: Diagnosis not present

## 2020-03-17 DIAGNOSIS — M542 Cervicalgia: Secondary | ICD-10-CM | POA: Diagnosis not present

## 2020-03-17 DIAGNOSIS — M2569 Stiffness of other specified joint, not elsewhere classified: Secondary | ICD-10-CM | POA: Diagnosis not present

## 2020-03-17 DIAGNOSIS — R531 Weakness: Secondary | ICD-10-CM | POA: Diagnosis not present

## 2020-03-20 DIAGNOSIS — M2569 Stiffness of other specified joint, not elsewhere classified: Secondary | ICD-10-CM | POA: Diagnosis not present

## 2020-03-20 DIAGNOSIS — R531 Weakness: Secondary | ICD-10-CM | POA: Diagnosis not present

## 2020-03-20 DIAGNOSIS — M542 Cervicalgia: Secondary | ICD-10-CM | POA: Diagnosis not present

## 2020-03-20 DIAGNOSIS — R293 Abnormal posture: Secondary | ICD-10-CM | POA: Diagnosis not present

## 2020-03-21 ENCOUNTER — Institutional Professional Consult (permissible substitution): Payer: PPO | Admitting: Neurology

## 2020-03-22 DIAGNOSIS — M2560 Stiffness of unspecified joint, not elsewhere classified: Secondary | ICD-10-CM | POA: Diagnosis not present

## 2020-03-22 DIAGNOSIS — R293 Abnormal posture: Secondary | ICD-10-CM | POA: Diagnosis not present

## 2020-03-22 DIAGNOSIS — M542 Cervicalgia: Secondary | ICD-10-CM | POA: Diagnosis not present

## 2020-03-24 DIAGNOSIS — M2560 Stiffness of unspecified joint, not elsewhere classified: Secondary | ICD-10-CM | POA: Diagnosis not present

## 2020-03-24 DIAGNOSIS — R293 Abnormal posture: Secondary | ICD-10-CM | POA: Diagnosis not present

## 2020-03-24 DIAGNOSIS — M542 Cervicalgia: Secondary | ICD-10-CM | POA: Diagnosis not present

## 2020-03-28 DIAGNOSIS — M542 Cervicalgia: Secondary | ICD-10-CM | POA: Diagnosis not present

## 2020-03-28 DIAGNOSIS — R293 Abnormal posture: Secondary | ICD-10-CM | POA: Diagnosis not present

## 2020-03-28 DIAGNOSIS — M2560 Stiffness of unspecified joint, not elsewhere classified: Secondary | ICD-10-CM | POA: Diagnosis not present

## 2020-04-04 DIAGNOSIS — R293 Abnormal posture: Secondary | ICD-10-CM | POA: Diagnosis not present

## 2020-04-04 DIAGNOSIS — M2560 Stiffness of unspecified joint, not elsewhere classified: Secondary | ICD-10-CM | POA: Diagnosis not present

## 2020-04-04 DIAGNOSIS — M542 Cervicalgia: Secondary | ICD-10-CM | POA: Diagnosis not present

## 2020-04-06 DIAGNOSIS — R293 Abnormal posture: Secondary | ICD-10-CM | POA: Diagnosis not present

## 2020-04-06 DIAGNOSIS — M2560 Stiffness of unspecified joint, not elsewhere classified: Secondary | ICD-10-CM | POA: Diagnosis not present

## 2020-04-06 DIAGNOSIS — M542 Cervicalgia: Secondary | ICD-10-CM | POA: Diagnosis not present

## 2020-04-11 DIAGNOSIS — M542 Cervicalgia: Secondary | ICD-10-CM | POA: Diagnosis not present

## 2020-04-11 DIAGNOSIS — R293 Abnormal posture: Secondary | ICD-10-CM | POA: Diagnosis not present

## 2020-04-11 DIAGNOSIS — M2560 Stiffness of unspecified joint, not elsewhere classified: Secondary | ICD-10-CM | POA: Diagnosis not present

## 2020-04-13 DIAGNOSIS — M2560 Stiffness of unspecified joint, not elsewhere classified: Secondary | ICD-10-CM | POA: Diagnosis not present

## 2020-04-13 DIAGNOSIS — M542 Cervicalgia: Secondary | ICD-10-CM | POA: Diagnosis not present

## 2020-04-13 DIAGNOSIS — R293 Abnormal posture: Secondary | ICD-10-CM | POA: Diagnosis not present

## 2020-04-17 DIAGNOSIS — M2560 Stiffness of unspecified joint, not elsewhere classified: Secondary | ICD-10-CM | POA: Diagnosis not present

## 2020-04-17 DIAGNOSIS — M542 Cervicalgia: Secondary | ICD-10-CM | POA: Diagnosis not present

## 2020-04-17 DIAGNOSIS — R293 Abnormal posture: Secondary | ICD-10-CM | POA: Diagnosis not present

## 2020-04-19 DIAGNOSIS — M2560 Stiffness of unspecified joint, not elsewhere classified: Secondary | ICD-10-CM | POA: Diagnosis not present

## 2020-04-19 DIAGNOSIS — R293 Abnormal posture: Secondary | ICD-10-CM | POA: Diagnosis not present

## 2020-04-19 DIAGNOSIS — M542 Cervicalgia: Secondary | ICD-10-CM | POA: Diagnosis not present

## 2020-04-25 DIAGNOSIS — M542 Cervicalgia: Secondary | ICD-10-CM | POA: Diagnosis not present

## 2020-04-25 DIAGNOSIS — R293 Abnormal posture: Secondary | ICD-10-CM | POA: Diagnosis not present

## 2020-04-25 DIAGNOSIS — M2560 Stiffness of unspecified joint, not elsewhere classified: Secondary | ICD-10-CM | POA: Diagnosis not present

## 2020-04-27 DIAGNOSIS — R293 Abnormal posture: Secondary | ICD-10-CM | POA: Diagnosis not present

## 2020-04-27 DIAGNOSIS — M2560 Stiffness of unspecified joint, not elsewhere classified: Secondary | ICD-10-CM | POA: Diagnosis not present

## 2020-04-27 DIAGNOSIS — M542 Cervicalgia: Secondary | ICD-10-CM | POA: Diagnosis not present

## 2020-05-02 ENCOUNTER — Encounter: Payer: Self-pay | Admitting: Neurology

## 2020-05-02 ENCOUNTER — Telehealth: Payer: Self-pay | Admitting: Neurology

## 2020-05-02 ENCOUNTER — Ambulatory Visit: Payer: PPO | Admitting: Neurology

## 2020-05-02 VITALS — BP 124/72 | HR 65 | Ht 65.0 in | Wt 157.0 lb

## 2020-05-02 DIAGNOSIS — G454 Transient global amnesia: Secondary | ICD-10-CM | POA: Diagnosis not present

## 2020-05-02 DIAGNOSIS — G479 Sleep disorder, unspecified: Secondary | ICD-10-CM

## 2020-05-02 DIAGNOSIS — F439 Reaction to severe stress, unspecified: Secondary | ICD-10-CM

## 2020-05-02 DIAGNOSIS — G4733 Obstructive sleep apnea (adult) (pediatric): Secondary | ICD-10-CM | POA: Diagnosis not present

## 2020-05-02 DIAGNOSIS — R293 Abnormal posture: Secondary | ICD-10-CM | POA: Diagnosis not present

## 2020-05-02 DIAGNOSIS — M2560 Stiffness of unspecified joint, not elsewhere classified: Secondary | ICD-10-CM | POA: Diagnosis not present

## 2020-05-02 DIAGNOSIS — M542 Cervicalgia: Secondary | ICD-10-CM | POA: Diagnosis not present

## 2020-05-02 NOTE — Telephone Encounter (Signed)
I called pt. No answer, left a message asking pt to call me back.   

## 2020-05-02 NOTE — Telephone Encounter (Signed)
Please confirm that she is okay taking a baby aspirin daily. I would recommend it, as discussed.

## 2020-05-02 NOTE — Patient Instructions (Signed)
I am glad to see that you are feeling better.  Your neurological exam is normal today. I am very pleased. Nevertheless, as discussed, we will do additional testing for completion. We will do a brain MRI with and without contrast and call you with the results.  We will do a brainwave test in the office here and call you with the results, this is called EEG.  We will schedule you on your way out today.  In preparation for the brain MRI with contrast I would like to make sure we have an updated evaluation of your kidney and liver function, we will do one blood test today.  We will do an ultrasound of your main arteries of the neck, called carotid arteries, we will schedule this separately.  Please call us if you do not hear from [facility] about scheduling your so-called carotid Doppler ultrasound in the next 2 weeks.  Please check with your primary care physician, Dr. Forde Dandy and your GI specialist if you can start taking a baby aspirin daily, I would like for you to consider taking 81 mg of aspirin daily so long as you can tolerate this.  We will also repeat your home sleep test to reevaluate your sleep apnea and see if we can optimize your treatment for it.  We will call you to schedule your sleep test at home.

## 2020-05-02 NOTE — Progress Notes (Signed)
Subjective:    Patient ID: Kara Mitchell is a 69 y.o. female.  HPI     Star Age, MD, PhD [facility] [address], [address] [place],  [address]  Dear Dr. Forde Dandy,   I saw your patient, Kara Mitchell, upon your kind request, in my Neurologic clinic today for initial consultation of her recent diagnosis of transient global amnesia.  The patient is unaccompanied today.  As you know, [name] is a 69 year old right-handed woman with an underlying medical history of arthritis, mild sleep apnea, hypothyroidism, lung nodule, plantar fasciitis, diverticulosis, reflux disease, prior smoking, mildly overweight state and history of rosacea, who had a recent episode of memory lapse in June.  She had a stressful situation at the time, had recently been told that her dog was terminally ill and needed to be put down.  She did not have recollection of certain events around that time and sending text messages at the time.  Symptoms lasted altogether a few hours.  She had a nail appointment and she has hardly any recollection of.  The attendant at the nail salon told her that she was crying the whole time.  Patient remembers being in touch with her boss who is also her friend and he insisted that she should get checked out as he was worried about a stroke.  She did not have any one-sided weakness or numbness or tingling or fall or droopy face. Some 8 weeks ago she had to put her beloved dog down and this was hard.  She is tearful due to this today. She also has had additional stress regarding her daughter.  Patient reports that the daughter was going through a lot of stress and weight loss but thankfully is better now. She presented to the emergency room on 02/16/2020 for this issue.  I reviewed the emergency room records.  She had a head CT without contrast on 02/16/2020 and I reviewed the results: Impression: No acute intracranial pathology.  She had a nonfocal neurological  exam, no slurring of speech.  She was suspected to have transient global amnesia due to stress.  I have previously evaluated her for sleep apnea.  I last saw her in 2018 for this.  She reports that she currently does not use her machine.  She would like to get reevaluated.  She is not sleeping very well. Since the episode in June, she has felt at baseline, no recurrence or no other similar issues, no recurrent headache. She had a recent appointment with your office, those records are not available for my review today.  She had blood work.  She has been started on low-dose Crestor.  She reports that she had been taking Mobic every day.  By December 2020 she started having stomach problems.  She saw GI for this and has eventually stopped using the Mobic due to side effects. On upper GI endoscopy she was found to have a Schatzki ring which was dilated.  She was also placed on budesonide which she finished.  Her lower GI endoscopy showed no precancerous or cancerous lesions. She has been seeing Dr. Arnoldo Morale for neck pain.  She has had physical therapy.   Previously (copied from previous notes for reference):   06/23/2017: 69 year old right-handed woman with an underlying medical history of esophageal stricture, hypothyroidism, pulmonary nodule, arthritis, knee surgery, status post tonsillectomy and status post low back surgery, overweight state, who presents for follow-up consultation of her obstructive sleep apnea, after recent  home sleep testing and starting Pap therapy at home. The patient is unaccompanied today. I first met her on 04/01/2017 at the request of her primary care physician, at which time she reported snoring and daytime somnolence. I suggested we proceed with sleep study testing. She had a home sleep test on 04/16/2017 indicating overall mild sleep apnea with an index of 7.2 per hour, O2 nadir of 78%. I suggested we proceed with AutoPap therapy. She called with problems tolerating the pressure  and mask and mouth dryness. I changed her settings to CPAP of 8 cm and addition of chinstrap.   I reviewed her CPAP compliance data from 05/27/2017 through 06/18/2017 which is a total of 23 days, during which time she used her machine 22 days with percent used days greater than 4 hours at 96%, indicating excellent compliance with an average usage of 7 hours and 12 minutes, residual AHI 4.8 per hour, leak high with the 95th percentile at 32.8 L/m on a pressure of 8 cm with EPR of 3. She reports still struggling with her CPAP. She does not like to use it. She is not able to be comfortable with it. She would like to look into using an oral appliance. She saw Dr. Redmond Baseman in ENT on 06/12/17, and he recommended trial of afrin for a few days. She has mild inferior turbinate hypertrophy. On the positive side, she does feel like she is not as sleepy during the day after having used CPAP. She notices the higher leak. She also would like to be able to tolerate treatment better throughout the night.      04/01/17: (She) reports possible snoring and excessive daytime somnolence. I reviewed your office note from 11/20/2016, which you kindly included. She quit smoking in 1994. She is divorced. She lives alone. She has 3 grown children. Family history is not available because she was adopted. She drinks alcohol occasionally, caffeine in the form of coffee about 2 cups per day. Her Epworth sleepiness score is 12 out of 24, fatigue score is 36 out of 63. She has no telltale Sx of RLS and no night to night nocturia. She does not recall dreaming. Her biggest complaint is not waking up rested despite getting about 8 hours of sleep on an average night and being tired during the day. She is able to nap but tries to avoid it. To be fully rested she requires at least 8 hours of sleep and she tries to get 8-1/2 hours on most nights. Bedtime is between 10 and 10:30 PM, wakeup time around 7. She does wake up with leg cramps and has to walk  it out typically. Her symptoms have been ongoing for at least a year. She has 1 son and 2 twin daughters, age 73. She had back surgery twice, last time some 2 years ago under Dr. Arnoldo Morale. She had good results with her back surgeries but has to sleep on her back.   Her Past Medical History Is Significant For: Past Medical History:  Diagnosis Date  . Arthritis   . Complication of anesthesia    no fent/versed  . Hiatal hernia   . Hypothyroidism   . Lung nodule    right, lower-seen on CT, had second CT 01/06/14-followed by Dr Roxan Hockey  . Plantar fasciitis 2019   Left   . PONV (postoperative nausea and vomiting)   . Rosacea   . Strabismus   . Thyroid disease   . Urinary incontinence     Her Past  Surgical History Is Significant For: Past Surgical History:  Procedure Laterality Date  . BACK SURGERY  2012   lumb lam  . BLEPHAROPLASTY  10/2012  . BREAST BIOPSY  1/95  . CYSTOCELE REPAIR  2007  . DISTAL INTERPHALANGEAL JOINT FUSION Right 12/21/2015   Procedure: DEBRIDEMENT OF RIGHT DISTAL INTERPHALANGEAL JOINT ;  Surgeon: Leanora Cover, MD;  Location: [facility];  Service: Orthopedics;  Laterality: Right;  . ENTEROCELE REPAIR  2010  . ESOPHAGEAL DILATION  [id number]  . KNEE SURGERY     x2-lt  . MASS EXCISION Right 12/21/2015   Procedure: RIGHT INDEX EXCISION MASS;  Surgeon: Leanora Cover, MD;  Location: [place] [facility];  Service: Orthopedics;  Laterality: Right;  . OTHER SURGICAL HISTORY  2001   HNP L4-5, Dr. Arnoldo Morale  . TONSILLECTOMY AND ADENOIDECTOMY  1974  . TRIGGER FINGER RELEASE Right 02/18/2014   Procedure: RELEASE A-1 PULLEY RIGHT THUMB/POSSIBLE INJ TO LEFT THUMB;  Surgeon: Cammie Sickle, MD;  Location: [facility];  Service: Orthopedics;  Laterality: Right;  . TRIGGER FINGER RELEASE Left 05/16/2016   Procedure: LEFT THUMB RELEASE TRIGGER FINGER;  Surgeon: Leanora Cover, MD;  Location: [facility];  Service: Orthopedics;   Laterality: Left;  Marland Kitchen VAGINAL HYSTERECTOMY  05/03/2002  . VEIN LIGATION AND STRIPPING  2009    Her Family History Is Significant For: Family History  Adopted: Yes  Problem Relation Age of Onset  . Cancer Other         NO FAMILY HX    Her Social History Is Significant For: Social History   Socioeconomic History  . Marital status: Divorced    Spouse name: Not on file  . Number of children: Not on file  . Years of education: Not on file  . Highest education level: Not on file  Occupational History  . Not on file  Tobacco Use  . Smoking status: Former Smoker    Packs/day: 1.50    Years: 25.00    Pack years: 37.50    Types: Cigarettes    Quit date: 01/08/1993    Years since quitting: 27.3  . Smokeless tobacco: Never Used  Vaping Use  . Vaping Use: Never used  Substance and Sexual Activity  . Alcohol use: Yes    Alcohol/week: 1.0 standard drink    Types: 1 Standard drinks or equivalent per week    Comment: rarely  . Drug use: No  . Sexual activity: Not Currently    Partners: Male    Birth control/protection: Post-menopausal, Surgical    Comment: [facility]  Other Topics Concern  . Not on file  Social History Narrative  . Not on file   Social Determinants of Health   Financial Resource Strain:   . Difficulty of Paying Living Expenses: Not on file  Food Insecurity:   . Worried About Charity fundraiser in the Last Year: Not on file  . Ran Out of Food in the Last Year: Not on file  Transportation Needs:   . Lack of Transportation (Medical): Not on file  . Lack of Transportation (Non-Medical): Not on file  Physical Activity:   . Days of Exercise per Week: Not on file  . Minutes of Exercise per Session: Not on file  Stress:   . Feeling of Stress : Not on file  Social Connections:   . Frequency of Communication with Friends and Family: Not on file  . Frequency of Social Gatherings with Friends and Family:  Not on file  . Attends Religious Services: Not on file  . Active  Member of Clubs or Organizations: Not on file  . Attends Archivist Meetings: Not on file  . Marital Status: Not on file    Her Allergies Are:  Allergies  Allergen Reactions  . Fentanyl Other (See Comments)    Low blood pressure  . Meloxicam Diarrhea  . Neosporin [Neomycin-Bacitracin Zn-Polymyx] Other (See Comments)    whelps  . Polysporin [Bacitracin-Polymyxin B] Other (See Comments)    whelps  . Tetanus Toxoids Other (See Comments)    fainting  . Versed [Midazolam] Other (See Comments)    Low blood pressure  :   Her Current Medications Are:  Outpatient Encounter Medications as of 05/02/2020  Medication Sig  . diphenhydrAMINE-APAP, sleep, (TYLENOL PM EXTRA STRENGTH PO) Take by mouth. 1 every night  . ibuprofen (ADVIL,MOTRIN) 800 MG tablet Take 1 tablet (800 mg total) by mouth every 8 (eight) hours as needed.  Marland Kitchen oxybutynin (DITROPAN-XL) 10 MG 24 hr tablet Take 1 tablet (10 mg total) by mouth daily.  . pantoprazole (PROTONIX) 40 MG tablet Take 40 mg by mouth daily.  . rosuvastatin (CRESTOR) 5 MG tablet Take by mouth. 2 per week  . thyroid (ARMOUR THYROID) 30 MG tablet Take 30 mg by mouth daily.  . [DISCONTINUED] LATISSE 0.03 % ophthalmic solution Instill 1 drop via applicator once a day  . [DISCONTINUED] meloxicam (MOBIC) 7.5 MG tablet Take 1 tablet (7.5 mg total) by mouth daily.  . [DISCONTINUED] Vitamin D, Ergocalciferol, (DRISDOL) 50000 UNITS CAPS capsule take 1 capsule by mouth every week   No facility-administered encounter medications on file as of 05/02/2020.  :   Review of Systems:  Out of a complete 14 point review of systems, all are reviewed and negative with the exception of these symptoms as listed below:  Review of Systems  Neurological:       Pt here to discuss amnesia episode. Back in June(6/23) pt reported to [facility] for evaluation. She had received some distressing news about having to put her dog down and could not remember sending a text  message. Pt attributes this episode to stress and since has returned to her base line. CT was completed in the ED and was unremarkable.     Objective:  Neurological Exam  Physical Exam Physical Examination:   Vitals:   05/02/20 0922  BP: 124/72  Pulse: 65  SpO2: 98%   General Examination: The patient is a very pleasant 69 y.o. female in no acute distress. She appears well-developed and well-nourished and well groomed.   HEENT: Normocephalic, atraumatic, pupils are equal, round and reactive to light and accommodation. Funduscopic exam is normal with sharp disc margins noted. Extraocular tracking is good without limitation to gaze excursion or nystagmus noted. Normal smooth pursuit is noted. Hearing is grossly intact. Tympanic membranes are clear bilaterally. Face is symmetric with normal facial animation and normal facial sensation. Speech is clear with no dysarthria noted. There is no hypophonia. There is no lip, neck/head, jaw or voice tremor. Neck is supple with full range of passive and active motion. There are no carotid bruits on auscultation. Oropharynx exam reveals: mild mouth dryness, good dental hygiene. Tongue protrudes centrally and palate elevates symmetrically.   Chest: Clear to auscultation without wheezing, rhonchi or crackles noted.  Heart: S1+S2+0, regular and normal without murmurs, rubs or gallops noted.   Abdomen: Soft, non-tender and non-distended with normal bowel sounds appreciated on  auscultation.  Extremities: There is no pitting edema in the distal lower extremities bilaterally. Pedal pulses are intact.  Skin: Warm and dry without trophic changes noted.  Musculoskeletal: exam reveals no obvious joint deformities, tenderness or joint swelling or erythema.   Neurologically:  Mental status: The patient is awake, alert and oriented in all 4 spheres. Her immediate and remote memory, attention, language skills and fund of knowledge are appropriate. There is no  evidence of aphasia, agnosia, apraxia or anomia. Speech is clear with normal prosody and enunciation. Thought process is linear. Mood is normal and affect is normal.  Cranial nerves II - XII are as described above under HEENT exam. In addition: shoulder shrug is normal with equal shoulder height noted. Motor exam: Normal bulk, strength and tone is noted. There is no drift, tremor or rebound. Romberg is negative. Reflexes are 2+ throughout. Babinski: Toes are flexor bilaterally. Fine motor skills and coordination: intact with normal finger taps, normal hand movements, normal rapid alternating patting, normal foot taps and normal foot agility.  Cerebellar testing: No dysmetria or intention tremor on finger to nose testing. Heel to shin is unremarkable bilaterally. There is no truncal or gait ataxia.  Sensory exam: intact to light touch, vibration and temperature sense in the upper and lower extremities.  Gait, station and balance: She stands easily. No veering to one side is noted. No leaning to one side is noted. Posture is age-appropriate and stance is narrow based. Gait shows normal stride length and normal pace. No problems turning are noted. Tandem walk is unremarkable.    Assessment and plan:  In summary, JAQUEL COOMER is a very pleasant 69 y.o.-year old female with an underlying medical history of arthritis, mild sleep apnea, hypothyroidism, lung nodule, plantar fasciitis, diverticulosis, reflux disease, prior smoking, mildly overweight state and history of rosacea, who presents for evaluation of an episode of memory lapse which occurred in June of this year.  She had an emergency room evaluation and a CAT scan at the time, all unrevealing.  She did have a stressful event that precipitated this amnestic event.  She had no focal neurologic deficit or symptom at the time.  Neurological exam today is nonfocal and rather reassuring.  Differential diagnosis includes transient global amnesia, stress  reaction, less likely TIA.  She is advised to proceed with additional testing for completion including carotid Doppler ultrasound to look for any carotid artery stenoses.  We will also proceed with a brain MRI with and without contrast and an EEG also seizure would be unlikely.  I am glad to hear that she has felt at baseline, has not had any new neurological symptoms.  For general vascular prevention I wonder if she could tolerate a baby aspirin.  She has been started on a cholesterol medication recently.  She is advised to check with your office and her GI specialist if she could be deemed safe to take a baby aspirin every day or perhaps even every other day.  She was recently evaluated for GI problems which were probably connected to her taking Mobic every day.  She has stopped taking the Mobic.  She was also treated for Schatzki ring recently.  She is advised that we will keep her posted as to her test results by phone call and follow-up afterwards.  In addition, we mutually agreed to reevaluate her sleep apnea.  She had difficulty tolerating CPAP therapy and has stopped using her machine.  She is willing to get reevaluated with a  home sleep test which I ordered.  I plan to see her back after testing is completed and we will call her in the interim with her test results as they become available.  We talked about the importance of stress reduction, healthy lifestyle, good nutrition and good hydration, getting enough rest.  I answered all her questions today and she was in agreement with the above plan.  Thank you very much for allowing me to participate in the care of this nice patient. If I can be of any further assistance to you please do not hesitate to call me at [phone].  Sincerely,   Star Age, MD, PhD

## 2020-05-02 NOTE — Telephone Encounter (Signed)
FYI-Pt called to inform provider that Dr. Earlean Shawl said it is ok to take Baby aspirin daily and also the word she was looking for earlier was "Microscopic Colitis"

## 2020-05-02 NOTE — Telephone Encounter (Signed)
health team order sent to GI. No auth they will reach out to the patient to schedule.  

## 2020-05-03 ENCOUNTER — Telehealth: Payer: Self-pay | Admitting: Neurology

## 2020-05-03 ENCOUNTER — Telehealth: Payer: Self-pay

## 2020-05-03 LAB — COMPREHENSIVE METABOLIC PANEL
ALT: 15 IU/L (ref 0–32)
AST: 18 IU/L (ref 0–40)
Albumin/Globulin Ratio: 3.1 — ABNORMAL HIGH (ref 1.2–2.2)
Albumin: 4.7 g/dL (ref 3.8–4.8)
Alkaline Phosphatase: 85 IU/L (ref 48–121)
BUN/Creatinine Ratio: 23 (ref 12–28)
BUN: 12 mg/dL (ref 8–27)
Bilirubin Total: 0.2 mg/dL (ref 0.0–1.2)
CO2: 27 mmol/L (ref 20–29)
Calcium: 9.2 mg/dL (ref 8.7–10.3)
Chloride: 106 mmol/L (ref 96–106)
Creatinine, Ser: 0.53 mg/dL — ABNORMAL LOW (ref 0.57–1.00)
GFR calc Af Amer: 113 mL/min/{1.73_m2} (ref >59)
GFR calc non Af Amer: 98 mL/min/{1.73_m2} (ref >59)
Globulin, Total: 1.5 g/dL (ref 1.5–4.5)
Glucose: 81 mg/dL (ref 65–99)
Potassium: 4.3 mmol/L (ref 3.5–5.2)
Sodium: 145 mmol/L — ABNORMAL HIGH (ref 134–144)
Total Protein: 6.2 g/dL (ref 6.0–8.5)

## 2020-05-03 NOTE — Telephone Encounter (Signed)
Pt called me back and she confirmed she will begin taking the 81 mg aspirin daily.

## 2020-05-03 NOTE — Telephone Encounter (Signed)
Noted, I spoke with the pt earlier and advised we would update her on her labs once they were reviewed. Labs were drawn on 05/02/2020.

## 2020-05-03 NOTE — Telephone Encounter (Signed)
Pt asking for results to bloodwork before her MRI is done, please call.

## 2020-05-03 NOTE — Telephone Encounter (Signed)
Called patient to schedule HST. She is still asking for results of lab work. Wants to know if she needs to do MRI with or without contrast. She has to be at that appt by 9:10am. Please call her first thing in the morning.

## 2020-05-04 ENCOUNTER — Other Ambulatory Visit: Payer: Self-pay

## 2020-05-04 ENCOUNTER — Ambulatory Visit
Admission: RE | Admit: 2020-05-04 | Discharge: 2020-05-04 | Disposition: A | Discharge status: Home / Self Care | Payer: PPO | Source: Ambulatory Visit | Attending: Neurology | Admitting: Neurology

## 2020-05-04 DIAGNOSIS — F439 Reaction to severe stress, unspecified: Secondary | ICD-10-CM

## 2020-05-04 DIAGNOSIS — G454 Transient global amnesia: Secondary | ICD-10-CM | POA: Diagnosis not present

## 2020-05-04 DIAGNOSIS — G4733 Obstructive sleep apnea (adult) (pediatric): Secondary | ICD-10-CM

## 2020-05-04 DIAGNOSIS — G479 Sleep disorder, unspecified: Secondary | ICD-10-CM

## 2020-05-04 MED ORDER — GADOBENATE DIMEGLUMINE 529 MG/ML IV SOLN
14.0000 mL | Freq: Once | INTRAVENOUS | Status: AC | PRN
  Administered 2020-05-04: 14 mL via INTRAVENOUS

## 2020-05-04 NOTE — Telephone Encounter (Signed)
Labs were fine, sodium level borderline up, but unlikely of significance. Please update pt.

## 2020-05-04 NOTE — Telephone Encounter (Signed)
Pt notified of blood test results. She verbalized understanding.

## 2020-05-09 ENCOUNTER — Telehealth: Payer: Self-pay | Admitting: Neurology

## 2020-05-09 DIAGNOSIS — M2560 Stiffness of unspecified joint, not elsewhere classified: Secondary | ICD-10-CM | POA: Diagnosis not present

## 2020-05-09 DIAGNOSIS — M542 Cervicalgia: Secondary | ICD-10-CM | POA: Diagnosis not present

## 2020-05-09 DIAGNOSIS — R293 Abnormal posture: Secondary | ICD-10-CM | POA: Diagnosis not present

## 2020-05-09 NOTE — Telephone Encounter (Signed)
Pt called wanting to discuss her MRI results. Please advise.

## 2020-05-09 NOTE — Telephone Encounter (Signed)
Once results from MRI are finalized I will call pt with Dr. Guadelupe Sabin recommendations.

## 2020-05-11 DIAGNOSIS — R293 Abnormal posture: Secondary | ICD-10-CM | POA: Diagnosis not present

## 2020-05-11 DIAGNOSIS — M542 Cervicalgia: Secondary | ICD-10-CM | POA: Diagnosis not present

## 2020-05-11 DIAGNOSIS — M2560 Stiffness of unspecified joint, not elsewhere classified: Secondary | ICD-10-CM | POA: Diagnosis not present

## 2020-05-11 NOTE — Telephone Encounter (Signed)
This pt`s MRI images came accross as only pre contrast even though order and tech note states w/wo. I have asked emily to ask intellirad folks to look into this and will do an addendum when I have access to post contrast images

## 2020-05-11 NOTE — Telephone Encounter (Signed)
Pt called, MyChart do not have results for  MRI with contrast. Would like to know what is the delay.  Informed Pt nurse will call her when Dr Rexene Alberts has finalized MRI results. Would like a call from the nurse.

## 2020-05-11 NOTE — Telephone Encounter (Signed)
I called the Kara Mitchell and updated on Dr. Guadelupe Sabin note. Kara Mitchell was advised once we get clarification on the contrasted images from the reading physician we will call back.

## 2020-05-11 NOTE — Telephone Encounter (Signed)
Thank you :)

## 2020-05-11 NOTE — Telephone Encounter (Signed)
Please apologize for the delay.  I was waiting on the reading physician to make a comment about the contrast portion of the study.  The noncontrasted images were benign.  We will update her when we have additional results on the contrasted portion of the study.

## 2020-05-16 ENCOUNTER — Encounter: Payer: Self-pay | Admitting: Neurology

## 2020-05-16 DIAGNOSIS — R293 Abnormal posture: Secondary | ICD-10-CM | POA: Diagnosis not present

## 2020-05-16 DIAGNOSIS — M2560 Stiffness of unspecified joint, not elsewhere classified: Secondary | ICD-10-CM | POA: Diagnosis not present

## 2020-05-16 DIAGNOSIS — M542 Cervicalgia: Secondary | ICD-10-CM | POA: Diagnosis not present

## 2020-05-17 ENCOUNTER — Telehealth: Payer: Self-pay | Admitting: Neurology

## 2020-05-17 ENCOUNTER — Encounter (HOSPITAL_COMMUNITY): Payer: PPO

## 2020-05-17 ENCOUNTER — Other Ambulatory Visit: Payer: PPO

## 2020-05-17 ENCOUNTER — Ambulatory Visit (HOSPITAL_COMMUNITY)
Admission: RE | Admit: 2020-05-17 | Discharge: 2020-05-17 | Disposition: A | Discharge status: Home / Self Care | Payer: PPO | Source: Ambulatory Visit | Attending: Cardiovascular Disease | Admitting: Cardiovascular Disease

## 2020-05-17 ENCOUNTER — Other Ambulatory Visit: Payer: Self-pay

## 2020-05-17 ENCOUNTER — Ambulatory Visit: Payer: PPO | Admitting: Neurology

## 2020-05-17 DIAGNOSIS — G4733 Obstructive sleep apnea (adult) (pediatric): Secondary | ICD-10-CM | POA: Diagnosis not present

## 2020-05-17 DIAGNOSIS — R410 Disorientation, unspecified: Secondary | ICD-10-CM

## 2020-05-17 DIAGNOSIS — G454 Transient global amnesia: Secondary | ICD-10-CM | POA: Diagnosis not present

## 2020-05-17 DIAGNOSIS — F439 Reaction to severe stress, unspecified: Secondary | ICD-10-CM | POA: Insufficient documentation

## 2020-05-17 DIAGNOSIS — G479 Sleep disorder, unspecified: Secondary | ICD-10-CM | POA: Insufficient documentation

## 2020-05-17 NOTE — Telephone Encounter (Signed)
Please call patient or email her through [facility] to advise her that the postcontrast images were delayed, which is unusual but were finally received on 05/16/2020 and an addendum was made to her report.  Thankfully, the postcontrast images were benign.

## 2020-05-17 NOTE — Telephone Encounter (Signed)
i called GI on Monday 05/15/20 and spoke with Kara Mitchell they said they are sending over the images and that they  should be able to see it within minutes. I also sent this to Dr. Leonie Man on Monday so I believe the images should be there.

## 2020-05-17 NOTE — Telephone Encounter (Signed)
Pt was in the office today for EEG study. After study was completed I spoke verbally with the pt and relayed her MRI contrasted imaging report.  She verbalized understanding and appreciation for the the feedback.

## 2020-05-18 DIAGNOSIS — M542 Cervicalgia: Secondary | ICD-10-CM | POA: Diagnosis not present

## 2020-05-18 DIAGNOSIS — M2560 Stiffness of unspecified joint, not elsewhere classified: Secondary | ICD-10-CM | POA: Diagnosis not present

## 2020-05-18 DIAGNOSIS — R293 Abnormal posture: Secondary | ICD-10-CM | POA: Diagnosis not present

## 2020-05-18 NOTE — Progress Notes (Signed)
Please call and advise the patient that the EEG or brain wave test we performed was reported as normal in the awake and asleep states. We checked for abnormal electrical discharges in the brain waves and the report suggested normal findings. No further action is required on this test at this time. Please remind patient to keep any upcoming appointments or tests and to call us with any interim questions, concerns, problems or updates. Thanks,   Also, carotid ultrasound was reported as normal but it is not a finalized report, only preliminary.  We will update if the final report changes but so far the report of the ultrasound of the neck arteries looks good as well.  Star Age, MD, PhD

## 2020-05-22 DIAGNOSIS — H35371 Puckering of macula, right eye: Secondary | ICD-10-CM | POA: Diagnosis not present

## 2020-05-22 DIAGNOSIS — Z961 Presence of intraocular lens: Secondary | ICD-10-CM | POA: Diagnosis not present

## 2020-05-22 DIAGNOSIS — H26493 Other secondary cataract, bilateral: Secondary | ICD-10-CM | POA: Diagnosis not present

## 2020-05-22 DIAGNOSIS — H43813 Vitreous degeneration, bilateral: Secondary | ICD-10-CM | POA: Diagnosis not present

## 2020-05-23 DIAGNOSIS — M542 Cervicalgia: Secondary | ICD-10-CM | POA: Diagnosis not present

## 2020-05-23 DIAGNOSIS — M2560 Stiffness of unspecified joint, not elsewhere classified: Secondary | ICD-10-CM | POA: Diagnosis not present

## 2020-05-23 DIAGNOSIS — R293 Abnormal posture: Secondary | ICD-10-CM | POA: Diagnosis not present

## 2020-05-24 ENCOUNTER — Ambulatory Visit (INDEPENDENT_AMBULATORY_CARE_PROVIDER_SITE_OTHER): Payer: PPO | Admitting: Neurology

## 2020-05-24 ENCOUNTER — Other Ambulatory Visit: Payer: Self-pay

## 2020-05-24 DIAGNOSIS — G4733 Obstructive sleep apnea (adult) (pediatric): Secondary | ICD-10-CM | POA: Diagnosis not present

## 2020-05-24 DIAGNOSIS — G454 Transient global amnesia: Secondary | ICD-10-CM

## 2020-05-24 DIAGNOSIS — G479 Sleep disorder, unspecified: Secondary | ICD-10-CM

## 2020-05-24 DIAGNOSIS — F439 Reaction to severe stress, unspecified: Secondary | ICD-10-CM

## 2020-05-25 DIAGNOSIS — R293 Abnormal posture: Secondary | ICD-10-CM | POA: Diagnosis not present

## 2020-05-25 DIAGNOSIS — M2560 Stiffness of unspecified joint, not elsewhere classified: Secondary | ICD-10-CM | POA: Diagnosis not present

## 2020-05-25 DIAGNOSIS — M542 Cervicalgia: Secondary | ICD-10-CM | POA: Diagnosis not present

## 2020-05-25 NOTE — Addendum Note (Signed)
Addended by: Star Age on: 05/25/2020 06:04 PM   Modules accepted: Orders

## 2020-05-25 NOTE — Progress Notes (Signed)
I recently saw patient for evaluation of her episode of amnesia.  She had not been using her CPAP in some time.  She was diagnosed with mild obstructive sleep apnea in 2018.  She had a home sleep test on 05/24/2020 which confirmed mild obstructive sleep apnea with an AHI of 14.6, O2 nadir of 86%.  I would recommend that she restart using her CPAP machine.  We can write for new supplies.  She is probably not eligible to get a new machine.  Please also arrange for a follow-up appointment in 3 months, she can be scheduled with one of our nurse practitioners as well.

## 2020-05-25 NOTE — Procedures (Signed)
Sleep Study Report   Patient Information     First Name: Kara Last Name: Mitchell ID: [id number]  Birth Date: 11/16/2050 Age: 69 Gender: Female  Referring Provider Reynold Bowen, MD BMI: 26.1 (W=156 lb, H=5' 5'')    Epworth: 12/24  Sleep Study Information    Study Date: 05/24/20 S/H/A Version: [id number] / 4.2.1023 / 42  History:    69 year old woman with a history of arthritis, mild sleep apnea, hypothyroidism, lung nodule, plantar fasciitis, diverticulosis, reflux disease, prior smoking, mildly overweight state and history of rosacea, who had a recent episode of memory lapse in June. She was previously diagnosed with mild sleep apnea, but has not used her PAP machine. She is not sleeping well.  Summary & Diagnosis:     Mild OSA  Recommendations:     This home sleep test demonstrates overall mild obstructive sleep apnea with a total AHI of 14.6/hour and O2 nadir of 86%. Given the patient's medical history and sleep related complaints, treatment with positive airway pressure is recommended. This can be achieved in the form of autoPAP trial/titration at home. A full night CPAP titration study can help with proper treatment settings and mask fitting, if needed down the road. Alternative treatments may include weight loss along with avoidance of the supine sleep position, or an oral appliance in appropriate candidates.   Please note that untreated obstructive sleep apnea may carry additional perioperative morbidity. Patients with significant obstructive sleep apnea should receive perioperative PAP therapy and the surgeons and particularly the anesthesiologist should be informed of the diagnosis and the severity of the sleep disordered breathing. The patient should be cautioned not to drive, work at heights, or operate dangerous or heavy equipment when tired or sleepy. Review and reiteration of good sleep hygiene measures should be pursued with any patient. Other causes of the patient's symptoms,  including circadian rhythm disturbances, an underlying mood disorder, medication effect and/or an underlying medical problem cannot be ruled out based on this test. Clinical correlation is recommended.   The patient and his referring provider will be notified of the test results. The patient will be seen in follow up in sleep clinic at [facility].  I certify that I have reviewed the raw data recording prior to the issuance of this report in accordance with the standards of the American Academy of Sleep Medicine (AASM).  Star Age, MD, PhD [facility]) Diplomat, ABPN (Neurology and Sleep)         Sleep Summary  Oxygen Saturation Statistics   Start Study Time: End Study Time: Total Recording Time:          10:34:08 PM 7:45:44 AM   9 h, 11 min  Total Sleep Time % REM of Sleep Time:  8 h, 6 min  22.1    Mean: 94 Minimum: 86 Maximum: 98  Mean of Desaturations Nadirs (%):   92  Oxygen Desaturation. %: 4-9 10-20 >20 Total  Events Number Total  51 100.0  0 0.0  0 0.0  51 100.0  Oxygen Saturation: <90 <=88 <85 <80 <70  Duration (minutes): Sleep % 0.5 0.4 0.1 0.1 0.0 0.0 0.0 0.0 0.0 0.0     Respiratory Indices      Total Events REM NREM All Night  pRDI: pAHI 3%: ODI 4%: pAHIc 3%: % CSR: pAHI 4%:  164 118   51  19 0.0 59 23.7 16.9 6.2 2.3 19.4 14.0 6.4 2.4 20.3 14.6 6.3 2.4 7.3  Pulse Rate Statistics during Sleep (BPM)      Mean: 54 Minimum: N/A Maximum: 95    Indices are calculated using technically valid sleep time of 8 h, 3 min.                      pAHI=14.6                                    Mild              Moderate                    Severe                                                 5              15                    30   Body Position Statistics  Position Supine Prone Right Left Non-Supine  Sleep (min) 459.1 0.0 0.0 27.0 27.0  Sleep % 94.4 0.0 0.0 5.6 5.6  pRDI 21.2 N/A N/A 6.7 6.7  pAHI 3% 15.2 N/A  N/A 4.5 4.5  ODI 4% 6.6 N/A N/A 2.2 2.2         Left   Supine    Snoring Statistics Snoring Level (dB) >40 >50 >60 >70 >80 >Threshold (45)  Sleep (min) 162.2 4.0 1.9 0.0 0.0 5.3  Sleep % 33.4 0.8 0.4 0.0 0.0 1.1    Mean: 41 dB Sleep Stages Chart                         Wake  Sleep      Wake  11.87  %    Sleep  88.13  %   Total:  100.00  %                                                       REM  Light  Deep      REM  22.11  %    Light  60.40  %    Deep  17.49  %   Total:  100.00  %                                 Sleep/Wake States  Sleep Stages  Sleep Latency (min):  REM Latency (min):  Number of Wakes:   23   79   10

## 2020-05-29 ENCOUNTER — Telehealth: Payer: Self-pay

## 2020-05-29 DIAGNOSIS — G4733 Obstructive sleep apnea (adult) (pediatric): Secondary | ICD-10-CM

## 2020-05-29 NOTE — Telephone Encounter (Signed)
Order for new auto pap sent to [facility] via fax. Confirmation received that the order transmitted was successful.

## 2020-05-29 NOTE — Telephone Encounter (Signed)
I can make an addendum to the sleep report.   If she is eligible for a new machine, I can write for a new CPAP or AutoPap machine.  If her machine is over 68 years old we can certainly try to get her a new machine.  I will write for an AutoPap.

## 2020-05-29 NOTE — Telephone Encounter (Signed)
I called pt. I offered her an appt on 05/31/2020 at 9:30am with Dr. Rexene Alberts and she accepted. She is agreeable to a new auto pap order being sent to [facility]. She is agreeable to a f/u with Amy, NP on 09/05/2020 at 9:30am.

## 2020-05-29 NOTE — Telephone Encounter (Signed)
-----   Message from Star Age, MD sent at 05/25/2020  6:04 PM EDT ----- I recently saw patient for evaluation of her episode of amnesia.  She had not been using her CPAP in some time.  She was diagnosed with mild obstructive sleep apnea in 2018.  She had a home sleep test on 05/24/2020 which confirmed mild obstructive sleep apnea with an AHI of 14.6, O2 nadir of 86%.  I would recommend that she restart using her CPAP machine.  We can write for new supplies.  She is probably not eligible to get a new machine.  Please also arrange for a follow-up appointment in 3 months, she can be scheduled with one of our nurse practitioners as well.

## 2020-05-29 NOTE — Telephone Encounter (Signed)
I called pt. I had an extended conversation with her regarding her sleep study results.  Pt would like Dr. Rexene Alberts to write an order for a new cpap for her. I explained that her current machine is less than five years old and insurance may not cover it. She says that she has new insurance and wants a new cpap.  Pt also reports that she is unhappy with the way the sleep study procedure note was written to say: "She was previously diagnosed with mild sleep apnea, but has not used her PAP machine." She would like this changed. She tried to use her cpap machine but reports that [facility] could not find her a good mask. She does not want this to reflect poorly on her compliance with Dr. Guadelupe Sabin recommendations and would like this reflected in the sleep study.  She would like a new DME and I recommended [facility] instead of [facility].  Pt also reports that she was told that she would be offered an appt to discuss all her test results with Dr. Rexene Alberts and would like it to be sooner than January.

## 2020-05-30 DIAGNOSIS — R293 Abnormal posture: Secondary | ICD-10-CM | POA: Diagnosis not present

## 2020-05-30 DIAGNOSIS — M2560 Stiffness of unspecified joint, not elsewhere classified: Secondary | ICD-10-CM | POA: Diagnosis not present

## 2020-05-30 DIAGNOSIS — M542 Cervicalgia: Secondary | ICD-10-CM | POA: Diagnosis not present

## 2020-05-31 ENCOUNTER — Encounter: Payer: Self-pay | Admitting: Neurology

## 2020-05-31 ENCOUNTER — Ambulatory Visit: Payer: PPO | Admitting: Neurology

## 2020-05-31 VITALS — BP 136/78 | HR 77 | Ht 65.0 in | Wt 157.0 lb

## 2020-05-31 DIAGNOSIS — G4733 Obstructive sleep apnea (adult) (pediatric): Secondary | ICD-10-CM | POA: Diagnosis not present

## 2020-05-31 DIAGNOSIS — G454 Transient global amnesia: Secondary | ICD-10-CM

## 2020-05-31 NOTE — Progress Notes (Signed)
Subjective:    Kara Mitchell ID: Kara Mitchell is a 69 y.o. female.  HPI     Interim history:   Kara Mitchell is a 69 year old right-handed woman with an underlying medical history of arthritis, mild sleep apnea, hypothyroidism, lung nodule, plantar fasciitis, diverticulosis, reflux disease, prior smoking, mildly overweight state and history of rosacea, who Presents for follow-up consultation of Kara Mitchell acute episode of memory loss/lapse in June 2021, concern for transient global amnesia.  Kara Mitchell is unaccompanied today.  I evaluated Kara Mitchell for this on 05/02/2020, at which time Kara Mitchell was referred by Kara Mitchell primary care physician, Dr. Forde Dandy.  Kara Mitchell had a several hour episode of lapse in memory.  Kara Mitchell had experienced significant stress that day.  Kara Mitchell does remember being stressed out in crying.  Kara Mitchell did not have any symptoms of one-sided weakness or numbness or droopy face or slurring of speech.  Kara Mitchell had a nonfocal neurological exam at Kara time and felt at baseline.  I suggested work-up in Kara form of brain MRI with and without contrast, EEG and sleep study.  Kara Mitchell had not used Kara Mitchell CPAP because of difficulty with Kara Mitchell mask.  Previously, Kara Mitchell was compliant with Kara Mitchell CPAP therapy and compliance data was excellent in October 2018.    Kara Mitchell had a brain MRI with and without contrast on 05/04/2020 and I reviewed Kara results: IMPRESSION: Unremarkable MRI scan of Kara brain  without contrast.  Contrast was administered but postcontrast images were not available at Kara time of this dictation. Addendum   Postcontrast images were not available for my review until today and did not show any abnormal enhancement within Kara brain and are unremarkable.  Kara Mitchell had an EEG on 05/17/2020 and I reviewed Kara results: Summary  Normal electroencephalogram, awake, asleep and with activation procedures. There are no focal lateralizing or epileptiform features.      Kara Mitchell had a home sleep test on 05/24/2020, which showed mild obstructive sleep apnea with a  total AHI of 14.6/hour and O2 nadir of 86%.  Kara Mitchell was advised regarding Kara Mitchell test results by phone call.  Kara Mitchell was advised to restart using Kara Mitchell CPAP.  Kara Mitchell requested a new machine and I prescribed an AutoPap machine.  Today, 05/31/2020: Kara Mitchell reports feeling better, stable with regards to neurological symptoms, no similar symptoms of relapse or memory loss.  Kara Mitchell has not yet received Kara Mitchell new AutoPap machine, has had contact with Kara Mitchell new DME company called [facility] based in [place], [place].  Kara Mitchell has a history of neck pain and has found some relief with dry needling through physical therapy.  Kara Mitchell has been taking a baby aspirin daily without problems.  Kara Mitchell is very motivated to go back on treatment for Kara Mitchell sleep apnea and trying to get a better mask fit.  Kara Mitchell has had no new medications or medical issues to update.  Kara Mitchell's allergies, current medications, family history, past medical history, past social history, past surgical history and problem list were reviewed and updated as appropriate.    Previously (copied from previous notes for reference):   05/02/20: (Kara Mitchell) had a recent episode of memory lapse in June.  Kara Mitchell had a stressful situation at Kara time, had recently been told that Kara Mitchell dog was terminally ill and needed to be put down.  Kara Mitchell did not have recollection of certain events around that time and sending text messages at Kara time.  Symptoms lasted altogether a few hours.  Kara Mitchell had a nail appointment and Kara Mitchell has hardly any recollection  of.  Kara attendant at Kara nail salon told Kara Mitchell that Kara Mitchell was crying Kara whole time.  Kara Mitchell remembers being in touch with Kara Mitchell boss who is also Kara Mitchell friend and he insisted that Kara Mitchell should get checked out as he was worried about a stroke.  Kara Mitchell did not have any one-sided weakness or numbness or tingling or fall or droopy face. Some 8 weeks ago Kara Mitchell had to put Kara Mitchell beloved dog down and this was hard.  Kara Mitchell is tearful due to this today. Kara Mitchell also has had additional stress  regarding Kara Mitchell daughter.  Kara Mitchell reports that Kara daughter was going through a lot of stress and weight loss but thankfully is better now. Kara Mitchell presented to Kara emergency room on 02/16/2020 for this issue.  I reviewed Kara emergency room records.  Kara Mitchell had a head CT without contrast on 02/16/2020 and I reviewed Kara results: Impression: No acute intracranial pathology.  Kara Mitchell had a nonfocal neurological exam, no slurring of speech.  Kara Mitchell was suspected to have transient global amnesia due to stress.  I have previously evaluated Kara Mitchell for sleep apnea.  I last saw Kara Mitchell in 2018 for this.  Kara Mitchell reports that Kara Mitchell currently does not use Kara Mitchell machine.  Kara Mitchell would like to get reevaluated.  Kara Mitchell is not sleeping very well. Since Kara episode in June, Kara Mitchell has felt at baseline, no recurrence or no other similar issues, no recurrent headache. Kara Mitchell had a recent appointment with your office, those records are not available for my review today.  Kara Mitchell had blood work.  Kara Mitchell has been started on low-dose Crestor.  Kara Mitchell reports that Kara Mitchell had been taking Mobic every day.  By December 2020 Kara Mitchell started having stomach problems.  Kara Mitchell saw GI for this and has eventually stopped using Kara Mobic due to side effects. On upper GI endoscopy Kara Mitchell was found to have a Schatzki ring which was dilated.  Kara Mitchell was also placed on budesonide which Kara Mitchell finished.  Kara Mitchell lower GI endoscopy showed no precancerous or cancerous lesions. Kara Mitchell has been seeing Dr. Arnoldo Morale for neck pain.  Kara Mitchell has had physical therapy.     06/23/2017: 69 year old right-handed woman with an underlying medical history of esophageal stricture, hypothyroidism, pulmonary nodule, arthritis, knee surgery, status post tonsillectomy and status post low back surgery, overweight state, who presents for follow-up consultation of Kara Mitchell obstructive sleep apnea, after recent home sleep testing and starting Pap therapy at home. Kara Mitchell is unaccompanied today. I first met Kara Mitchell on 04/01/2017 at Kara request of Kara Mitchell primary  care physician, at which time Kara Mitchell reported snoring and daytime somnolence. I suggested we proceed with sleep study testing. Kara Mitchell had a home sleep test on 04/16/2017 indicating overall mild sleep apnea with an index of 7.2 per hour, O2 nadir of 78%. I suggested we proceed with AutoPap therapy. Kara Mitchell called with problems tolerating Kara pressure and mask and mouth dryness. I changed Kara Mitchell settings to CPAP of 8 cm and addition of chinstrap.   I reviewed Kara Mitchell CPAP compliance data from 05/27/2017 through 06/18/2017 which is a total of 23 days, during which time Kara Mitchell used Kara Mitchell machine 22 days with percent used days greater than 4 hours at 96%, indicating excellent compliance with an average usage of 7 hours and 12 minutes, residual AHI 4.8 per hour, leak high with Kara 95th percentile at 32.8 L/m on a pressure of 8 cm with EPR of 3. Kara Mitchell reports still struggling with Kara Mitchell CPAP. Kara Mitchell does not like to use it. Kara Mitchell is not  able to be comfortable with it. Kara Mitchell would like to look into using an oral appliance. Kara Mitchell saw Dr. Redmond Baseman in ENT on 06/12/17, and he recommended trial of afrin for a few days. Kara Mitchell has mild inferior turbinate hypertrophy. On Kara positive side, Kara Mitchell does feel like Kara Mitchell is not as sleepy during Kara day after having used CPAP. Kara Mitchell notices Kara higher leak. Kara Mitchell also would like to be able to tolerate treatment better throughout Kara night.      04/01/17: (Kara Mitchell) reports possible snoring and excessive daytime somnolence. I reviewed your office note from 11/20/2016, which you kindly included. Kara Mitchell quit smoking in 1994. Kara Mitchell is divorced. Kara Mitchell lives alone. Kara Mitchell has 3 grown children. Family history is not available because Kara Mitchell was adopted. Kara Mitchell drinks alcohol occasionally, caffeine in Kara form of coffee about 2 cups per day. Kara Mitchell Epworth sleepiness score is 12 out of 24, fatigue score is 36 out of 63. Kara Mitchell has no telltale Sx of RLS and no night to night nocturia. Kara Mitchell does not recall dreaming. Kara Mitchell biggest complaint is not waking up rested  despite getting about 8 hours of sleep on an average night and being tired during Kara day. Kara Mitchell is able to nap but tries to avoid it. To be fully rested Kara Mitchell requires at least 8 hours of sleep and Kara Mitchell tries to get 8-1/2 hours on most nights. Bedtime is between 10 and 10:30 PM, wakeup time around 7. Kara Mitchell does wake up with leg cramps and has to walk it out typically. Kara Mitchell symptoms have been ongoing for at least a year. Kara Mitchell has 1 son and 2 twin daughters, age 38. Kara Mitchell had back surgery twice, last time some 2 years ago under Dr. Arnoldo Morale. Kara Mitchell had good results with Kara Mitchell back surgeries but has to sleep on Kara Mitchell back.  Kara Mitchell Past Medical History Is Significant For: Past Medical History:  Diagnosis Date  . Arthritis   . Complication of anesthesia    no fent/versed  . Hiatal hernia   . Hypothyroidism   . Lung nodule    right, lower-seen on CT, had second CT 01/06/14-followed by Dr Roxan Hockey  . Plantar fasciitis 2019   Left   . PONV (postoperative nausea and vomiting)   . Rosacea   . Strabismus   . Thyroid disease   . Urinary incontinence     Kara Mitchell Past Surgical History Is Significant For: Past Surgical History:  Procedure Laterality Date  . BACK SURGERY  2012   lumb lam  . BLEPHAROPLASTY  10/2012  . BREAST BIOPSY  1/95  . CYSTOCELE REPAIR  2007  . DISTAL INTERPHALANGEAL JOINT FUSION Right 12/21/2015   Procedure: DEBRIDEMENT OF RIGHT DISTAL INTERPHALANGEAL JOINT ;  Surgeon: Leanora Cover, MD;  Location: [facility];  Service: Orthopedics;  Laterality: Right;  . ENTEROCELE REPAIR  2010  . ESOPHAGEAL DILATION  4098;1191  . KNEE SURGERY     x2-lt  . MASS EXCISION Right 12/21/2015   Procedure: RIGHT INDEX EXCISION MASS;  Surgeon: Leanora Cover, MD;  Location: [facility];  Service: Orthopedics;  Laterality: Right;  . OTHER SURGICAL HISTORY  2001   HNP L4-5, Dr. Arnoldo Morale  . TONSILLECTOMY AND ADENOIDECTOMY  1974  . TRIGGER FINGER RELEASE Right 02/18/2014   Procedure: RELEASE A-1  PULLEY RIGHT THUMB/POSSIBLE INJ TO LEFT THUMB;  Surgeon: Cammie Sickle, MD;  Location: [facility];  Service: Orthopedics;  Laterality: Right;  . TRIGGER FINGER RELEASE Left 05/16/2016   Procedure: LEFT  THUMB RELEASE TRIGGER FINGER;  Surgeon: Leanora Cover, MD;  Location: [facility];  Service: Orthopedics;  Laterality: Left;  Marland Kitchen VAGINAL HYSTERECTOMY  05/03/2002  . VEIN LIGATION AND STRIPPING  2009    Kara Mitchell Family History Is Significant For: Family History  Adopted: Yes  Problem Relation Age of Onset  . Cancer Other         NO FAMILY HX    Kara Mitchell Social History Is Significant For: Social History   Socioeconomic History  . Marital status: Divorced    Spouse name: Not on file  . Number of children: Not on file  . Years of education: Not on file  . Highest education level: Not on file  Occupational History  . Not on file  Tobacco Use  . Smoking status: Former Smoker    Packs/day: 1.50    Years: 25.00    Pack years: 37.50    Types: Cigarettes    Quit date: 01/08/1993    Years since quitting: 27.4  . Smokeless tobacco: Never Used  Vaping Use  . Vaping Use: Never used  Substance and Sexual Activity  . Alcohol use: Yes    Alcohol/week: 1.0 standard drink    Types: 1 Standard drinks or equivalent per week    Comment: rarely  . Drug use: No  . Sexual activity: Not Currently    Partners: Male    Birth control/protection: Post-menopausal, Surgical    Comment: TVH  Other Topics Concern  . Not on file  Social History Narrative  . Not on file   Social Determinants of Health   Financial Resource Strain:   . Difficulty of Paying Living Expenses: Not on file  Food Insecurity:   . Worried About Charity fundraiser in Kara Last Year: Not on file  . Ran Out of Food in Kara Last Year: Not on file  Transportation Needs:   . Lack of Transportation (Medical): Not on file  . Lack of Transportation (Non-Medical): Not on file  Physical Activity:   . Days of  Exercise per Week: Not on file  . Minutes of Exercise per Session: Not on file  Stress:   . Feeling of Stress : Not on file  Social Connections:   . Frequency of Communication with Friends and Family: Not on file  . Frequency of Social Gatherings with Friends and Family: Not on file  . Attends Religious Services: Not on file  . Active Member of Clubs or Organizations: Not on file  . Attends Archivist Meetings: Not on file  . Marital Status: Not on file    Kara Mitchell Allergies Are:  Allergies  Allergen Reactions  . Fentanyl Other (See Comments)    Low blood pressure  . Meloxicam Diarrhea  . Neosporin [Neomycin-Bacitracin Zn-Polymyx] Other (See Comments)    whelps  . Polysporin [Bacitracin-Polymyxin B] Other (See Comments)    whelps  . Tetanus Toxoids Other (See Comments)    fainting  . Versed [Midazolam] Other (See Comments)    Low blood pressure  :   Kara Mitchell Current Medications Are:  Outpatient Encounter Medications as of 05/31/2020  Medication Sig  . diphenhydrAMINE-APAP, sleep, (TYLENOL PM EXTRA STRENGTH PO) Take by mouth. 1 every night  . ibuprofen (ADVIL,MOTRIN) 800 MG tablet Take 1 tablet (800 mg total) by mouth every 8 (eight) hours as needed.  Marland Kitchen oxybutynin (DITROPAN-XL) 10 MG 24 hr tablet Take 1 tablet (10 mg total) by mouth daily.  . pantoprazole (PROTONIX) 40 MG tablet Take  40 mg by mouth daily.  . rosuvastatin (CRESTOR) 5 MG tablet Take by mouth. 2 per week  . thyroid (ARMOUR THYROID) 30 MG tablet Take 30 mg by mouth daily.   No facility-administered encounter medications on file as of 05/31/2020.  :  Review of Systems:  Out of a complete 14 point review of systems, all are reviewed and negative with Kara exception of these symptoms as listed below:      Review of Systems  Neurological:       Here to f/u on recent testing. Wanted to discuss results with MD in Kara office.     Objective:  Neurological Exam  Physical Exam Physical Examination:   Vitals:    05/31/20 0926  BP: 136/78  Pulse: 77  SpO2: 97%   General Examination: Kara Mitchell is a very pleasant 69 y.o. female in no acute distress. Kara Mitchell appears well-developed and well-nourished and well groomed.   HEENT: Normocephalic, atraumatic, pupils are equal, round and reactive to light, tracking is good without limitation to gaze excursion or nystagmus noted. Hearing is grossly intact. Face is symmetric with normal facial animation. Speech is clear with no dysarthria noted. There is no hypophonia. There is no lip, neck/head, jaw or voice tremor. Neck is supple with full range of passive and active motion. There are no carotid bruits on auscultation. Oropharynx exam reveals: mild mouth dryness, good dental hygiene. Tongue protrudes centrally and palate elevates symmetrically.   Chest: Clear to auscultation without wheezing, rhonchi or crackles noted.  Heart: S1+S2+0, regular and normal without murmurs, rubs or gallops noted.   Abdomen: Soft, non-tender and non-distended with normal bowel sounds appreciated on auscultation.  Extremities: There is no pitting edema in Kara distal lower extremities bilaterally.   Skin: Warm and dry without trophic changes noted.  Musculoskeletal: exam reveals no obvious joint deformities, tenderness or joint swelling or erythema.   Neurologically:  Mental status: Kara Mitchell is awake, alert and oriented in all 4 spheres. Kara Mitchell immediate and remote memory, attention, language skills and fund of knowledge are appropriate. There is no evidence of aphasia, agnosia, apraxia or anomia. Speech is clear with normal prosody and enunciation. Thought process is linear. Mood is normal and affect is normal.  Cranial nerves II - XII are as described above under HEENT exam.  Motor exam: Normal bulk, strength and tone is noted. Fine motor skills and coordination: grossly intact.  Cerebellar testing: No dysmetria or intention tremor. There is no truncal or gait ataxia.   Sensory exam: intact to light touch in Kara upper and lower extremities.  Gait, station and balance: Kara Mitchell stands easily. No veering to one side is noted. No leaning to one side is noted. Posture is age-appropriate and stance is narrow based. Gait shows normal stride length and normal pace. No problems turning are noted.   Assessment and plan:  In summary, Kara Mitchell is a very pleasant 69 year old female with an underlying medical history of arthritis, mild sleep apnea, hypothyroidism, lung nodule, plantar fasciitis, diverticulosis, reflux disease, prior smoking, mildly overweight state and history of rosacea, who presents for follow-up consultation of an episode of memory lapse which occurred in June 2021.  He likely had an episode of transient global amnesia.  Kara Mitchell had extensive evaluation at Kara time through Kara ER and also a CAT scan at Kara time which was unrevealing.  Further work-up through our office included carotid Doppler ultrasound, brain MRI, EEG and home sleep testing.  Work-up was benign thankfully,  home sleep test reestablish Kara diagnosis of mild obstructive sleep apnea for which Kara Mitchell was on CPAP therapy and compliant before but started having difficulty with mask fit.  Kara Mitchell is very motivated to try CPAP or AutoPap again.  I prescribed a new machine.  Kara Mitchell is waiting to get Kara Mitchell new AutoPap machine.  Kara Mitchell is advised regarding Kara Mitchell benign test results and was reassured.  Thankfully, Kara Mitchell has been feeling at baseline and feels better with regards to Kara Mitchell stressors.  Kara Mitchell has a good support system, caring kids and grandchildren.  Kara Mitchell is advised to follow-up for sleep apnea checkup as scheduled in January 2022.  I answered all Kara Mitchell questions today and Kara Mitchell was in agreement with Kara plan. I spent 35 minutes in total face-to-face time and in reviewing records during pre-charting, more than 50% of which was spent in counseling and coordination of care, reviewing test results, reviewing medications and  treatment regimen and/or in discussing or reviewing Kara diagnosis of TGA, Kara prognosis and treatment options. Pertinent laboratory and imaging test results that were available during this visit with Kara Mitchell were reviewed by me and considered in my medical decision making (see chart for details).

## 2020-05-31 NOTE — Patient Instructions (Signed)
Verbal instructions given

## 2020-06-01 DIAGNOSIS — D1800 Hemangioma unspecified site: Secondary | ICD-10-CM | POA: Diagnosis not present

## 2020-06-01 DIAGNOSIS — L814 Other melanin hyperpigmentation: Secondary | ICD-10-CM | POA: Diagnosis not present

## 2020-06-01 DIAGNOSIS — D229 Melanocytic nevi, unspecified: Secondary | ICD-10-CM | POA: Diagnosis not present

## 2020-06-01 DIAGNOSIS — L821 Other seborrheic keratosis: Secondary | ICD-10-CM | POA: Diagnosis not present

## 2020-06-02 DIAGNOSIS — M542 Cervicalgia: Secondary | ICD-10-CM | POA: Diagnosis not present

## 2020-06-02 DIAGNOSIS — R293 Abnormal posture: Secondary | ICD-10-CM | POA: Diagnosis not present

## 2020-06-02 DIAGNOSIS — M2560 Stiffness of unspecified joint, not elsewhere classified: Secondary | ICD-10-CM | POA: Diagnosis not present

## 2020-06-06 DIAGNOSIS — M542 Cervicalgia: Secondary | ICD-10-CM | POA: Diagnosis not present

## 2020-06-06 DIAGNOSIS — R293 Abnormal posture: Secondary | ICD-10-CM | POA: Diagnosis not present

## 2020-06-06 DIAGNOSIS — M2560 Stiffness of unspecified joint, not elsewhere classified: Secondary | ICD-10-CM | POA: Diagnosis not present

## 2020-06-08 DIAGNOSIS — R293 Abnormal posture: Secondary | ICD-10-CM | POA: Diagnosis not present

## 2020-06-08 DIAGNOSIS — M2560 Stiffness of unspecified joint, not elsewhere classified: Secondary | ICD-10-CM | POA: Diagnosis not present

## 2020-06-08 DIAGNOSIS — M542 Cervicalgia: Secondary | ICD-10-CM | POA: Diagnosis not present

## 2020-06-13 DIAGNOSIS — R293 Abnormal posture: Secondary | ICD-10-CM | POA: Diagnosis not present

## 2020-06-13 DIAGNOSIS — M542 Cervicalgia: Secondary | ICD-10-CM | POA: Diagnosis not present

## 2020-06-13 DIAGNOSIS — M2560 Stiffness of unspecified joint, not elsewhere classified: Secondary | ICD-10-CM | POA: Diagnosis not present

## 2020-06-14 ENCOUNTER — Encounter: Payer: Self-pay | Admitting: Neurology

## 2020-06-15 DIAGNOSIS — M542 Cervicalgia: Secondary | ICD-10-CM | POA: Diagnosis not present

## 2020-06-15 DIAGNOSIS — R293 Abnormal posture: Secondary | ICD-10-CM | POA: Diagnosis not present

## 2020-06-15 DIAGNOSIS — M2560 Stiffness of unspecified joint, not elsewhere classified: Secondary | ICD-10-CM | POA: Diagnosis not present

## 2020-06-19 ENCOUNTER — Telehealth: Payer: Self-pay

## 2020-06-19 NOTE — Telephone Encounter (Signed)
Late entry of several telephone calls with patient that took place on 10/19 and 10/20. Pt called the sleep lab, upset and confused about her Rx sent in to [facility] for CPAP and supplies. Pt is upset that she cannot get a new machine. Pt has a CPAP machine that she received in 2018 from [facility]. Pt is currently non-compliant with that machine due to mask issues. A new Rx was sent out to [facility] due to patient wanting to change DME companies. Pt called upset, stating that her insurance told her that [facility] stopped [facility] from submitting to get a new machine. I contacted Matt from [facility] to clarify what was going on with patient and I also contacted Jeneen Rinks from [employer] to see if they were involved with patient as well. [facility] has not dealt with patient since March 2020.  Matt confirmed that they are unable to give pt a new machine due to her current machine is not 53 or more years old yet. I called pt back to explain everything as simply as I could to patient that she is to not get a new machine, she is just to get new supplies. I tried my best to explain to her that [facility] can check her current machine to make sure it is working properly (bc patient isn't sure if it is working properly). Also, explained to patient (multiple times) that her insurance will not pay for a new machine. I asked pt to schedule a mask fitting with [facility]. At end of converstion on 10/20, pt was agreeable to setting a appointment with [facility] to have a mask fitting and also request they check you CPAP machine.

## 2020-06-20 DIAGNOSIS — R293 Abnormal posture: Secondary | ICD-10-CM | POA: Diagnosis not present

## 2020-06-20 DIAGNOSIS — M2560 Stiffness of unspecified joint, not elsewhere classified: Secondary | ICD-10-CM | POA: Diagnosis not present

## 2020-06-20 DIAGNOSIS — M542 Cervicalgia: Secondary | ICD-10-CM | POA: Diagnosis not present

## 2020-06-22 DIAGNOSIS — R293 Abnormal posture: Secondary | ICD-10-CM | POA: Diagnosis not present

## 2020-06-22 DIAGNOSIS — M2560 Stiffness of unspecified joint, not elsewhere classified: Secondary | ICD-10-CM | POA: Diagnosis not present

## 2020-06-22 DIAGNOSIS — M542 Cervicalgia: Secondary | ICD-10-CM | POA: Diagnosis not present

## 2020-06-23 NOTE — Telephone Encounter (Signed)
Pt called stating that she has all the information that is needed for her to get her cpap machine and she is wanting to be called to discuss.

## 2020-06-27 DIAGNOSIS — M542 Cervicalgia: Secondary | ICD-10-CM | POA: Diagnosis not present

## 2020-06-27 DIAGNOSIS — M2560 Stiffness of unspecified joint, not elsewhere classified: Secondary | ICD-10-CM | POA: Diagnosis not present

## 2020-06-27 DIAGNOSIS — R293 Abnormal posture: Secondary | ICD-10-CM | POA: Diagnosis not present

## 2020-06-27 NOTE — Telephone Encounter (Signed)
LVM for pt to call me back.

## 2020-06-28 ENCOUNTER — Telehealth: Payer: Self-pay | Admitting: Neurology

## 2020-06-28 DIAGNOSIS — G4733 Obstructive sleep apnea (adult) (pediatric): Secondary | ICD-10-CM

## 2020-06-28 NOTE — Telephone Encounter (Signed)
I called pt. I had another extended conversation with her. She has been in touch with [facility], [facility], [facility], and [facility] over getting a new cpap. She is not sure that her current machine, which is less than 68 years old, is functioning properly. Apparently, she also spoke to our sleep lab and they told her that since she has not used her current cpap in over a year it needs to be serviced and verified that there is not any mold growth. She is asking for an order to service her old cpap as well as an order for a "new" auto pap since to [facility].

## 2020-06-28 NOTE — Telephone Encounter (Signed)
Pt. States this call is pertaining to Kara Mitchell. She asks if RN can call her back instead of her have having to leave a lengthy vm.

## 2020-06-28 NOTE — Telephone Encounter (Signed)
I spoke with Dr. Rexene Alberts. She is agreeable to these orders.  I called pt. I advised her of this. She asked that these orders be faxed to [facility] at [phone]. I have faxed the orders as requested. Received a receipt of confirmation. Pt verbalized understanding and appreciation.

## 2020-06-28 NOTE — Addendum Note (Signed)
Addended by: Lester Bigelow A on: 06/28/2020 02:14 PM   Modules accepted: Orders

## 2020-07-03 NOTE — Telephone Encounter (Signed)
I called pt. She reports that since she recently switched to Medicare she is able to get a new cpap. She is working with [employer] to get the new cpap. If she has not started her new cpap by 08/05/2020 she will let us know so that she can push out the 09/05/20 appt.

## 2020-07-03 NOTE — Telephone Encounter (Signed)
Pt. Would like a call back from RN regarding her CPAP machine.

## 2020-07-04 DIAGNOSIS — R293 Abnormal posture: Secondary | ICD-10-CM | POA: Diagnosis not present

## 2020-07-04 DIAGNOSIS — M542 Cervicalgia: Secondary | ICD-10-CM | POA: Diagnosis not present

## 2020-07-04 DIAGNOSIS — M2560 Stiffness of unspecified joint, not elsewhere classified: Secondary | ICD-10-CM | POA: Diagnosis not present

## 2020-07-06 DIAGNOSIS — M2560 Stiffness of unspecified joint, not elsewhere classified: Secondary | ICD-10-CM | POA: Diagnosis not present

## 2020-07-06 DIAGNOSIS — R293 Abnormal posture: Secondary | ICD-10-CM | POA: Diagnosis not present

## 2020-07-06 DIAGNOSIS — M542 Cervicalgia: Secondary | ICD-10-CM | POA: Diagnosis not present

## 2020-07-11 DIAGNOSIS — R293 Abnormal posture: Secondary | ICD-10-CM | POA: Diagnosis not present

## 2020-07-11 DIAGNOSIS — M2560 Stiffness of unspecified joint, not elsewhere classified: Secondary | ICD-10-CM | POA: Diagnosis not present

## 2020-07-11 DIAGNOSIS — M542 Cervicalgia: Secondary | ICD-10-CM | POA: Diagnosis not present

## 2020-07-13 DIAGNOSIS — M2560 Stiffness of unspecified joint, not elsewhere classified: Secondary | ICD-10-CM | POA: Diagnosis not present

## 2020-07-13 DIAGNOSIS — M542 Cervicalgia: Secondary | ICD-10-CM | POA: Diagnosis not present

## 2020-07-13 DIAGNOSIS — R293 Abnormal posture: Secondary | ICD-10-CM | POA: Diagnosis not present

## 2020-07-18 DIAGNOSIS — M2560 Stiffness of unspecified joint, not elsewhere classified: Secondary | ICD-10-CM | POA: Diagnosis not present

## 2020-07-18 DIAGNOSIS — R293 Abnormal posture: Secondary | ICD-10-CM | POA: Diagnosis not present

## 2020-07-18 DIAGNOSIS — M542 Cervicalgia: Secondary | ICD-10-CM | POA: Diagnosis not present

## 2020-07-25 DIAGNOSIS — M542 Cervicalgia: Secondary | ICD-10-CM | POA: Diagnosis not present

## 2020-07-25 DIAGNOSIS — M2560 Stiffness of unspecified joint, not elsewhere classified: Secondary | ICD-10-CM | POA: Diagnosis not present

## 2020-07-25 DIAGNOSIS — R293 Abnormal posture: Secondary | ICD-10-CM | POA: Diagnosis not present

## 2020-07-26 DIAGNOSIS — Z1231 Encounter for screening mammogram for malignant neoplasm of breast: Secondary | ICD-10-CM | POA: Diagnosis not present

## 2020-07-27 DIAGNOSIS — M542 Cervicalgia: Secondary | ICD-10-CM | POA: Diagnosis not present

## 2020-07-27 DIAGNOSIS — R293 Abnormal posture: Secondary | ICD-10-CM | POA: Diagnosis not present

## 2020-07-27 DIAGNOSIS — M2560 Stiffness of unspecified joint, not elsewhere classified: Secondary | ICD-10-CM | POA: Diagnosis not present

## 2020-08-01 DIAGNOSIS — Z7689 Persons encountering health services in other specified circumstances: Secondary | ICD-10-CM | POA: Diagnosis not present

## 2020-08-01 DIAGNOSIS — M542 Cervicalgia: Secondary | ICD-10-CM | POA: Diagnosis not present

## 2020-08-01 DIAGNOSIS — M2560 Stiffness of unspecified joint, not elsewhere classified: Secondary | ICD-10-CM | POA: Diagnosis not present

## 2020-08-01 DIAGNOSIS — R293 Abnormal posture: Secondary | ICD-10-CM | POA: Diagnosis not present

## 2020-08-02 DIAGNOSIS — G4733 Obstructive sleep apnea (adult) (pediatric): Secondary | ICD-10-CM | POA: Diagnosis not present

## 2020-08-03 DIAGNOSIS — R293 Abnormal posture: Secondary | ICD-10-CM | POA: Diagnosis not present

## 2020-08-03 DIAGNOSIS — M2560 Stiffness of unspecified joint, not elsewhere classified: Secondary | ICD-10-CM | POA: Diagnosis not present

## 2020-08-03 DIAGNOSIS — M542 Cervicalgia: Secondary | ICD-10-CM | POA: Diagnosis not present

## 2020-08-04 DIAGNOSIS — M542 Cervicalgia: Secondary | ICD-10-CM | POA: Diagnosis not present

## 2020-08-04 DIAGNOSIS — R030 Elevated blood-pressure reading, without diagnosis of hypertension: Secondary | ICD-10-CM | POA: Diagnosis not present

## 2020-08-04 DIAGNOSIS — Z6826 Body mass index (BMI) 26.0-26.9, adult: Secondary | ICD-10-CM | POA: Diagnosis not present

## 2020-08-08 DIAGNOSIS — R293 Abnormal posture: Secondary | ICD-10-CM | POA: Diagnosis not present

## 2020-08-08 DIAGNOSIS — M542 Cervicalgia: Secondary | ICD-10-CM | POA: Diagnosis not present

## 2020-08-08 DIAGNOSIS — M2560 Stiffness of unspecified joint, not elsewhere classified: Secondary | ICD-10-CM | POA: Diagnosis not present

## 2020-08-10 DIAGNOSIS — R293 Abnormal posture: Secondary | ICD-10-CM | POA: Diagnosis not present

## 2020-08-10 DIAGNOSIS — M542 Cervicalgia: Secondary | ICD-10-CM | POA: Diagnosis not present

## 2020-08-10 DIAGNOSIS — M2560 Stiffness of unspecified joint, not elsewhere classified: Secondary | ICD-10-CM | POA: Diagnosis not present

## 2020-08-15 DIAGNOSIS — M2560 Stiffness of unspecified joint, not elsewhere classified: Secondary | ICD-10-CM | POA: Diagnosis not present

## 2020-08-15 DIAGNOSIS — M542 Cervicalgia: Secondary | ICD-10-CM | POA: Diagnosis not present

## 2020-08-15 DIAGNOSIS — R293 Abnormal posture: Secondary | ICD-10-CM | POA: Diagnosis not present

## 2020-08-22 DIAGNOSIS — M542 Cervicalgia: Secondary | ICD-10-CM | POA: Diagnosis not present

## 2020-08-22 DIAGNOSIS — M2560 Stiffness of unspecified joint, not elsewhere classified: Secondary | ICD-10-CM | POA: Diagnosis not present

## 2020-08-22 DIAGNOSIS — R293 Abnormal posture: Secondary | ICD-10-CM | POA: Diagnosis not present

## 2020-08-26 DIAGNOSIS — G4733 Obstructive sleep apnea (adult) (pediatric): Secondary | ICD-10-CM | POA: Diagnosis not present

## 2020-08-29 DIAGNOSIS — E785 Hyperlipidemia, unspecified: Secondary | ICD-10-CM | POA: Diagnosis not present

## 2020-08-29 DIAGNOSIS — U071 COVID-19: Secondary | ICD-10-CM | POA: Diagnosis not present

## 2020-08-29 DIAGNOSIS — R059 Cough, unspecified: Secondary | ICD-10-CM | POA: Diagnosis not present

## 2020-09-02 DIAGNOSIS — G4733 Obstructive sleep apnea (adult) (pediatric): Secondary | ICD-10-CM | POA: Diagnosis not present

## 2020-09-04 ENCOUNTER — Telehealth: Payer: Self-pay | Admitting: Neurology

## 2020-09-04 DIAGNOSIS — G4733 Obstructive sleep apnea (adult) (pediatric): Secondary | ICD-10-CM

## 2020-09-04 NOTE — Telephone Encounter (Signed)
Pt. states she started using her CPAP machine on 08/28/20 & her mouth feels like it's full of cotton when she wakes up. She states they told her something else to try, but that did not help & they we would have to order her a heated tubing to help with the cotton mouth. She states she could also use a full faced mask, but that would have to be ordered as well. Pt. states [employer] is her CPAP company. Please contact pt. if unable to order these.

## 2020-09-04 NOTE — Addendum Note (Signed)
Addended by: Lester Leming A on: 09/04/2020 10:39 AM   Modules accepted: Orders

## 2020-09-04 NOTE — Telephone Encounter (Signed)
Heated tubing was included on the original cpap order. Nevertheless, I will send an order to [facility] again for heated tubing, per pt request.  Order faxed to [facility]. Received a receipt of confirmation.

## 2020-09-05 ENCOUNTER — Ambulatory Visit: Payer: Self-pay | Admitting: Family Medicine

## 2020-09-05 DIAGNOSIS — M2560 Stiffness of unspecified joint, not elsewhere classified: Secondary | ICD-10-CM | POA: Diagnosis not present

## 2020-09-05 DIAGNOSIS — R293 Abnormal posture: Secondary | ICD-10-CM | POA: Diagnosis not present

## 2020-09-05 DIAGNOSIS — M542 Cervicalgia: Secondary | ICD-10-CM | POA: Diagnosis not present

## 2020-09-06 DIAGNOSIS — Z780 Asymptomatic menopausal state: Secondary | ICD-10-CM | POA: Diagnosis not present

## 2020-09-14 DIAGNOSIS — M542 Cervicalgia: Secondary | ICD-10-CM | POA: Diagnosis not present

## 2020-09-14 DIAGNOSIS — M2560 Stiffness of unspecified joint, not elsewhere classified: Secondary | ICD-10-CM | POA: Diagnosis not present

## 2020-09-14 DIAGNOSIS — R293 Abnormal posture: Secondary | ICD-10-CM | POA: Diagnosis not present

## 2020-09-15 DIAGNOSIS — E785 Hyperlipidemia, unspecified: Secondary | ICD-10-CM | POA: Diagnosis not present

## 2020-09-15 DIAGNOSIS — G454 Transient global amnesia: Secondary | ICD-10-CM | POA: Diagnosis not present

## 2020-09-15 DIAGNOSIS — G4733 Obstructive sleep apnea (adult) (pediatric): Secondary | ICD-10-CM | POA: Diagnosis not present

## 2020-09-15 DIAGNOSIS — I700 Atherosclerosis of aorta: Secondary | ICD-10-CM | POA: Diagnosis not present

## 2020-09-15 DIAGNOSIS — K635 Polyp of colon: Secondary | ICD-10-CM | POA: Diagnosis not present

## 2020-09-15 DIAGNOSIS — E039 Hypothyroidism, unspecified: Secondary | ICD-10-CM | POA: Diagnosis not present

## 2020-09-15 DIAGNOSIS — M8580 Other specified disorders of bone density and structure, unspecified site: Secondary | ICD-10-CM | POA: Diagnosis not present

## 2020-09-18 ENCOUNTER — Encounter: Payer: Self-pay | Admitting: Neurology

## 2020-09-20 ENCOUNTER — Telehealth: Payer: Self-pay | Admitting: Neurology

## 2020-09-20 NOTE — Telephone Encounter (Signed)
Pt called, Can you tell me where the request for resmet CPAP machine was sent. If sent to [facility], need to be sent to [facility]. [facility] keep calling me. Would like a call from the nurse.

## 2020-09-20 NOTE — Telephone Encounter (Signed)
I called the pt and advised we orginally sent to [facility] per her request on 09/18/2020. Pt sts she received a call from [facility] in regards to a new order. I advised the order sent on 09/18/20 was indeed sent to [facility] per her request and I would send again.  Pt verbalized understanding.

## 2020-09-21 DIAGNOSIS — R293 Abnormal posture: Secondary | ICD-10-CM | POA: Diagnosis not present

## 2020-09-21 DIAGNOSIS — M2560 Stiffness of unspecified joint, not elsewhere classified: Secondary | ICD-10-CM | POA: Diagnosis not present

## 2020-09-21 DIAGNOSIS — M542 Cervicalgia: Secondary | ICD-10-CM | POA: Diagnosis not present

## 2020-09-28 DIAGNOSIS — R293 Abnormal posture: Secondary | ICD-10-CM | POA: Diagnosis not present

## 2020-09-28 DIAGNOSIS — M542 Cervicalgia: Secondary | ICD-10-CM | POA: Diagnosis not present

## 2020-09-28 DIAGNOSIS — M2560 Stiffness of unspecified joint, not elsewhere classified: Secondary | ICD-10-CM | POA: Diagnosis not present

## 2020-10-05 DIAGNOSIS — M2560 Stiffness of unspecified joint, not elsewhere classified: Secondary | ICD-10-CM | POA: Diagnosis not present

## 2020-10-05 DIAGNOSIS — M542 Cervicalgia: Secondary | ICD-10-CM | POA: Diagnosis not present

## 2020-10-05 DIAGNOSIS — R293 Abnormal posture: Secondary | ICD-10-CM | POA: Diagnosis not present

## 2020-10-10 DIAGNOSIS — M542 Cervicalgia: Secondary | ICD-10-CM | POA: Diagnosis not present

## 2020-10-10 DIAGNOSIS — R293 Abnormal posture: Secondary | ICD-10-CM | POA: Diagnosis not present

## 2020-10-10 DIAGNOSIS — M2560 Stiffness of unspecified joint, not elsewhere classified: Secondary | ICD-10-CM | POA: Diagnosis not present

## 2020-10-13 DIAGNOSIS — R293 Abnormal posture: Secondary | ICD-10-CM | POA: Diagnosis not present

## 2020-10-13 DIAGNOSIS — M2560 Stiffness of unspecified joint, not elsewhere classified: Secondary | ICD-10-CM | POA: Diagnosis not present

## 2020-10-13 DIAGNOSIS — M542 Cervicalgia: Secondary | ICD-10-CM | POA: Diagnosis not present

## 2020-10-19 DIAGNOSIS — R293 Abnormal posture: Secondary | ICD-10-CM | POA: Diagnosis not present

## 2020-10-19 DIAGNOSIS — M2560 Stiffness of unspecified joint, not elsewhere classified: Secondary | ICD-10-CM | POA: Diagnosis not present

## 2020-10-19 DIAGNOSIS — M542 Cervicalgia: Secondary | ICD-10-CM | POA: Diagnosis not present

## 2020-10-24 DIAGNOSIS — M542 Cervicalgia: Secondary | ICD-10-CM | POA: Diagnosis not present

## 2020-10-24 DIAGNOSIS — M2560 Stiffness of unspecified joint, not elsewhere classified: Secondary | ICD-10-CM | POA: Diagnosis not present

## 2020-10-24 DIAGNOSIS — R293 Abnormal posture: Secondary | ICD-10-CM | POA: Diagnosis not present

## 2020-11-02 DIAGNOSIS — M2560 Stiffness of unspecified joint, not elsewhere classified: Secondary | ICD-10-CM | POA: Diagnosis not present

## 2020-11-02 DIAGNOSIS — R293 Abnormal posture: Secondary | ICD-10-CM | POA: Diagnosis not present

## 2020-11-02 DIAGNOSIS — M542 Cervicalgia: Secondary | ICD-10-CM | POA: Diagnosis not present

## 2020-11-09 DIAGNOSIS — R293 Abnormal posture: Secondary | ICD-10-CM | POA: Diagnosis not present

## 2020-11-09 DIAGNOSIS — M542 Cervicalgia: Secondary | ICD-10-CM | POA: Diagnosis not present

## 2020-11-09 DIAGNOSIS — M2560 Stiffness of unspecified joint, not elsewhere classified: Secondary | ICD-10-CM | POA: Diagnosis not present

## 2020-11-16 DIAGNOSIS — R293 Abnormal posture: Secondary | ICD-10-CM | POA: Diagnosis not present

## 2020-11-16 DIAGNOSIS — M542 Cervicalgia: Secondary | ICD-10-CM | POA: Diagnosis not present

## 2020-11-16 DIAGNOSIS — M2560 Stiffness of unspecified joint, not elsewhere classified: Secondary | ICD-10-CM | POA: Diagnosis not present

## 2020-11-23 ENCOUNTER — Ambulatory Visit: Payer: Self-pay | Admitting: Family Medicine

## 2020-11-23 DIAGNOSIS — R293 Abnormal posture: Secondary | ICD-10-CM | POA: Diagnosis not present

## 2020-11-23 DIAGNOSIS — M2560 Stiffness of unspecified joint, not elsewhere classified: Secondary | ICD-10-CM | POA: Diagnosis not present

## 2020-11-23 DIAGNOSIS — M542 Cervicalgia: Secondary | ICD-10-CM | POA: Diagnosis not present

## 2020-11-29 ENCOUNTER — Other Ambulatory Visit: Payer: Self-pay | Admitting: Thoracic Surgery (Cardiothoracic Vascular Surgery)

## 2020-11-29 DIAGNOSIS — R911 Solitary pulmonary nodule: Secondary | ICD-10-CM

## 2020-11-30 DIAGNOSIS — R293 Abnormal posture: Secondary | ICD-10-CM | POA: Diagnosis not present

## 2020-11-30 DIAGNOSIS — M542 Cervicalgia: Secondary | ICD-10-CM | POA: Diagnosis not present

## 2020-11-30 DIAGNOSIS — M2560 Stiffness of unspecified joint, not elsewhere classified: Secondary | ICD-10-CM | POA: Diagnosis not present

## 2020-12-07 ENCOUNTER — Other Ambulatory Visit: Payer: Self-pay

## 2020-12-07 ENCOUNTER — Encounter (HOSPITAL_BASED_OUTPATIENT_CLINIC_OR_DEPARTMENT_OTHER): Payer: Self-pay | Admitting: Obstetrics & Gynecology

## 2020-12-07 ENCOUNTER — Ambulatory Visit (INDEPENDENT_AMBULATORY_CARE_PROVIDER_SITE_OTHER): Payer: PPO | Admitting: Obstetrics & Gynecology

## 2020-12-07 ENCOUNTER — Other Ambulatory Visit (HOSPITAL_COMMUNITY)
Admission: RE | Admit: 2020-12-07 | Discharge: 2020-12-07 | Disposition: A | Discharge status: Home / Self Care | Payer: PPO | Source: Ambulatory Visit | Attending: Obstetrics & Gynecology | Admitting: Obstetrics & Gynecology

## 2020-12-07 VITALS — BP 136/70 | HR 63 | Ht 63.0 in | Wt 159.0 lb

## 2020-12-07 DIAGNOSIS — Z0282 Encounter for adoption services: Secondary | ICD-10-CM | POA: Diagnosis not present

## 2020-12-07 DIAGNOSIS — Z01419 Encounter for gynecological examination (general) (routine) without abnormal findings: Secondary | ICD-10-CM | POA: Diagnosis not present

## 2020-12-07 DIAGNOSIS — M542 Cervicalgia: Secondary | ICD-10-CM | POA: Diagnosis not present

## 2020-12-07 DIAGNOSIS — M8580 Other specified disorders of bone density and structure, unspecified site: Secondary | ICD-10-CM | POA: Diagnosis not present

## 2020-12-07 DIAGNOSIS — R293 Abnormal posture: Secondary | ICD-10-CM | POA: Diagnosis not present

## 2020-12-07 DIAGNOSIS — Z124 Encounter for screening for malignant neoplasm of cervix: Secondary | ICD-10-CM | POA: Insufficient documentation

## 2020-12-07 DIAGNOSIS — N3281 Overactive bladder: Secondary | ICD-10-CM | POA: Diagnosis not present

## 2020-12-07 DIAGNOSIS — Z90710 Acquired absence of both cervix and uterus: Secondary | ICD-10-CM

## 2020-12-07 DIAGNOSIS — Z780 Asymptomatic menopausal state: Secondary | ICD-10-CM

## 2020-12-07 DIAGNOSIS — M2560 Stiffness of unspecified joint, not elsewhere classified: Secondary | ICD-10-CM | POA: Diagnosis not present

## 2020-12-07 DIAGNOSIS — Z1272 Encounter for screening for malignant neoplasm of vagina: Secondary | ICD-10-CM | POA: Diagnosis not present

## 2020-12-07 DIAGNOSIS — Z789 Other specified health status: Secondary | ICD-10-CM

## 2020-12-07 MED ORDER — OXYBUTYNIN CHLORIDE ER 10 MG PO TB24: 10.0000 mg | ORAL_TABLET | Freq: Every day | ORAL | 4 refills | Status: DC

## 2020-12-07 NOTE — Progress Notes (Signed)
70 y.o. G47P2003 Divorced White or Caucasian female here for breast and pelvic exam.  Denies vaginal bleeding.  Had MMG and BMD that is not in Epic.  Will get copies of these from [facility].    Patient's last menstrual period was 08/26/2001 (lmp unknown).          Sexually active: No.  H/O STD:  no  Health Maintenance: PCP:  Dr. Forde Dandy.  Last wellness appt was 10/2020.  Did blood work at that appt:  no Vaccines are up to date:  I do not have tdap and PCN application Colonoscopy:  09/2019, microscopic colitis MMG:  2021 BMD:  Jan or Feb, 2022 Last pap smear:  2019.   H/o abnormal pap smear:  no   reports that she quit smoking about 27 years ago. Her smoking use included cigarettes. She has a 37.50 pack-year smoking history. She has never used smokeless tobacco. She reports current alcohol use of about 1.0 standard drink of alcohol per week. She reports that she does not use drugs.  Past Medical History:  Diagnosis Date  . Arthritis   . Complication of anesthesia    no fent/versed  . Hiatal hernia   . Hypothyroidism   . Lung nodule    right, lower-seen on CT, had second CT 01/06/14-followed by Dr Roxan Hockey  . Plantar fasciitis 2019   Left   . PONV (postoperative nausea and vomiting)   . Rosacea   . Strabismus   . Thyroid disease   . Urinary incontinence     Past Surgical History:  Procedure Laterality Date  . BACK SURGERY  2012   lumb lam  . BLEPHAROPLASTY  10/2012  . BREAST BIOPSY  1/95  . CYSTOCELE REPAIR  2007  . DISTAL INTERPHALANGEAL JOINT FUSION Right 12/21/2015   Procedure: DEBRIDEMENT OF RIGHT DISTAL INTERPHALANGEAL JOINT ;  Surgeon: Leanora Cover, MD;  Location: [facility];  Service: Orthopedics;  Laterality: Right;  . ENTEROCELE REPAIR  2010  . ESOPHAGEAL DILATION  [id number]  . KNEE SURGERY     x2-lt  . MASS EXCISION Right 12/21/2015   Procedure: RIGHT INDEX EXCISION MASS;  Surgeon: Leanora Cover, MD;  Location: [facility];  Service:  Orthopedics;  Laterality: Right;  . OTHER SURGICAL HISTORY  2001   HNP L4-5, Dr. Arnoldo Morale  . TONSILLECTOMY AND ADENOIDECTOMY  1974  . TRIGGER FINGER RELEASE Right 02/18/2014   Procedure: RELEASE A-1 PULLEY RIGHT THUMB/POSSIBLE INJ TO LEFT THUMB;  Surgeon: Cammie Sickle, MD;  Location: [facility];  Service: Orthopedics;  Laterality: Right;  . TRIGGER FINGER RELEASE Left 05/16/2016   Procedure: LEFT THUMB RELEASE TRIGGER FINGER;  Surgeon: Leanora Cover, MD;  Location: [facility];  Service: Orthopedics;  Laterality: Left;  Marland Kitchen VAGINAL HYSTERECTOMY  05/03/2002  . VEIN LIGATION AND STRIPPING  2009    Current Outpatient Medications  Medication Sig Dispense Refill  . diphenhydrAMINE-APAP, sleep, (TYLENOL PM EXTRA STRENGTH PO) Take by mouth. 1 every night    . ibuprofen (ADVIL,MOTRIN) 800 MG tablet Take 1 tablet (800 mg total) by mouth every 8 (eight) hours as needed. 40 tablet 0  . oxybutynin (DITROPAN-XL) 10 MG 24 hr tablet Take 1 tablet (10 mg total) by mouth daily. 90 tablet 4  . pantoprazole (PROTONIX) 40 MG tablet Take 40 mg by mouth daily.    . rosuvastatin (CRESTOR) 5 MG tablet Take by mouth. 2 per week    . thyroid (ARMOUR) 30 MG tablet Take  30 mg by mouth daily.     No current facility-administered medications for this visit.    Family History  Adopted: Yes  Problem Relation Age of Onset  . Cancer Other         NO FAMILY HX    Review of Systems  All other systems reviewed and are negative.   Exam:   BP 136/70   Pulse 63   Ht 5\' 3"  (1.6 m)   Wt 159 lb (72.1 kg)   LMP 08/26/2001 (LMP Unknown)   BMI 28.17 kg/m   Height: 5\' 3"  (160 cm)  General appearance: alert, cooperative and appears stated age Breasts: normal appearance, no masses or tenderness Abdomen: soft, non-tender; bowel sounds normal; no masses,  no organomegaly Lymph nodes: Cervical, supraclavicular, and axillary nodes normal.  No abnormal inguinal nodes palpated Neurologic:  Grossly normal  Pelvic: External genitalia:  no lesions              Urethra:  normal appearing urethra with no masses, tenderness or lesions              Bartholins and Skenes: normal                 Vagina: normal appearing vagina with atrophic changes and no discharge, no lesions, 3rd degree rectocele noted today              Cervix: absent              Pap taken: No. Bimanual Exam:  Uterus:  uterus absent              Adnexa: no mass, fullness, tenderness               Rectovaginal: Confirms               Anus:  normal sphincter tone, no lesions  Chaperone, Prince Rome, CMA, was present for exam.  Assessment/Plan: 1. Encntr for gyn exam (general) (routine) w/o abn findings - pap and HR HPV Obtained today - will get copy of MMG and BMD - pt aware tdap is due - declines Covid booster and pneumonia vaccinations - lab work done with Dr. Forde Dandy  2. Postmenopausal - no HRT  3. OAB (overactive bladder)  4. History of total vaginal hysterectomy (TVH) with anterior repair  5. Osteopenia, unspecified location  6. Adopted  7.  Rectocele

## 2020-12-11 LAB — CYTOLOGY - PAP: Diagnosis: NEGATIVE

## 2020-12-14 DIAGNOSIS — M2560 Stiffness of unspecified joint, not elsewhere classified: Secondary | ICD-10-CM | POA: Diagnosis not present

## 2020-12-14 DIAGNOSIS — R293 Abnormal posture: Secondary | ICD-10-CM | POA: Diagnosis not present

## 2020-12-14 DIAGNOSIS — M542 Cervicalgia: Secondary | ICD-10-CM | POA: Diagnosis not present

## 2021-01-01 DIAGNOSIS — M1712 Unilateral primary osteoarthritis, left knee: Secondary | ICD-10-CM | POA: Diagnosis not present

## 2021-01-01 DIAGNOSIS — M25562 Pain in left knee: Secondary | ICD-10-CM | POA: Diagnosis not present

## 2021-01-02 DIAGNOSIS — R293 Abnormal posture: Secondary | ICD-10-CM | POA: Diagnosis not present

## 2021-01-02 DIAGNOSIS — M2560 Stiffness of unspecified joint, not elsewhere classified: Secondary | ICD-10-CM | POA: Diagnosis not present

## 2021-01-02 DIAGNOSIS — M542 Cervicalgia: Secondary | ICD-10-CM | POA: Diagnosis not present

## 2021-01-03 ENCOUNTER — Ambulatory Visit
Admission: RE | Admit: 2021-01-03 | Discharge: 2021-01-03 | Disposition: A | Discharge status: Home / Self Care | Payer: PPO | Source: Ambulatory Visit | Attending: Thoracic Surgery (Cardiothoracic Vascular Surgery) | Admitting: Thoracic Surgery (Cardiothoracic Vascular Surgery)

## 2021-01-03 DIAGNOSIS — R918 Other nonspecific abnormal finding of lung field: Secondary | ICD-10-CM | POA: Diagnosis not present

## 2021-01-03 DIAGNOSIS — R911 Solitary pulmonary nodule: Secondary | ICD-10-CM

## 2021-01-05 ENCOUNTER — Other Ambulatory Visit: Payer: PPO

## 2021-01-09 ENCOUNTER — Other Ambulatory Visit: Payer: Self-pay

## 2021-01-09 ENCOUNTER — Encounter: Payer: Self-pay | Admitting: Thoracic Surgery (Cardiothoracic Vascular Surgery)

## 2021-01-09 ENCOUNTER — Ambulatory Visit: Payer: PPO | Admitting: Thoracic Surgery (Cardiothoracic Vascular Surgery)

## 2021-01-09 VITALS — BP 125/82 | HR 67 | Resp 20 | Ht 63.0 in | Wt 160.0 lb

## 2021-01-09 DIAGNOSIS — R911 Solitary pulmonary nodule: Secondary | ICD-10-CM

## 2021-01-09 NOTE — Progress Notes (Signed)
Kara Mitchell,Kara Mitchell             [id number]     HPI: Kara Mitchell returns for a scheduled follow-up visit  Kara Mitchell is a 70 year old woman with a remote history of tobacco abuse, hiatal hernia, hypothyroidism, arthritis, and lung nodules.  She was first found to have a small nodule in the superior segment left lower lobe in 2014.  That was followed and did not change over 2 years.  In 2018 she had stable nodules.  I saw her in May 2021.  There was a new 3 mm right upper lobe nodule.  She now returns for follow-up.  She has a 37.5-pack-year history of smoking.  She quit in 1999.  She has not had any respiratory issues over the past year.  Past Medical History:  Diagnosis Date  . Arthritis   . Complication of anesthesia    no fent/versed  . Hiatal hernia   . Hypothyroidism   . Lung nodule    right, lower-seen on CT, had second CT 01/06/14-followed by Dr Roxan Hockey  . Plantar fasciitis 2019   Left   . PONV (postoperative nausea and vomiting)   . Rosacea   . Strabismus   . Thyroid disease   . Urinary incontinence     Current Outpatient Medications  Medication Sig Dispense Refill  . diphenhydrAMINE-APAP, sleep, (TYLENOL PM EXTRA STRENGTH PO) Take by mouth. 1 every night    . ibuprofen (ADVIL,MOTRIN) 800 MG tablet Take 1 tablet (800 mg total) by mouth every 8 (eight) hours as needed. 40 tablet 0  . oxybutynin (DITROPAN-XL) 10 MG 24 hr tablet Take 1 tablet (10 mg total) by mouth daily. 90 tablet 4  . pantoprazole (PROTONIX) 40 MG tablet Take 40 mg by mouth daily.    . rosuvastatin (CRESTOR) 5 MG tablet Take by mouth. 2 per week    . thyroid (ARMOUR) 30 MG tablet Take 30 mg by mouth daily.     No current facility-administered medications for this visit.    Physical Exam BP 125/82 (BP Location: Right Arm, Patient Position: Sitting)   Pulse 67   Resp 20   Ht 5\' 3"  (1.6 m)   Wt 160 lb (72.6 kg)   LMP 08/26/2001 (LMP Unknown)    SpO2 96% Comment: RA  BMI 28.34 kg/m  70 year old woman in no acute distress Alert and oriented x3 with no focal deficits Lungs clear with equal breath sounds bilaterally Cardiac regular rate and rhythm with normal S1 and S2 No cervical or supraclavicular adenopathy  Diagnostic Tests: CT CHEST WITHOUT CONTRAST  TECHNIQUE: Multidetector CT imaging of the chest was performed following the standard protocol without IV contrast.  COMPARISON:  CT chest 01/03/2020 and 03/04/2017  FINDINGS: Cardiovascular: Mild aortic atherosclerosis. No acute vascular findings on noncontrast imaging. The heart size is normal. There is no pericardial effusion.  Mediastinum/Nodes: There are no enlarged mediastinal, hilar or axillary lymph nodes.Hilar assessment is limited by the lack of intravenous contrast, although the hilar contours appear unchanged. The thyroid gland, trachea and esophagus demonstrate no significant findings.  Lungs/Pleura: No pleural effusion or pneumothorax. Stable 3 mm ground-glass nodule in the right upper lobe on image 23/8, consistent with a benign finding. The adjacent nodule seen on the previous study is no longer present. There are additional scattered benign-appearing tiny nodules in both lungs which are unchanged. No new or enlarging pulmonary nodules.  Upper  abdomen: No acute findings are seen within the visualized upper abdomen. There is a cystic lesion in the upper pole of the left kidney which is grossly stable.  Musculoskeletal/Chest wall: There is no chest wall mass or suspicious osseous finding.  IMPRESSION: 1. Interval resolution of tiny right upper lobe nodule. Additional pulmonary nodules are stable from 2018, consistent with benign findings. Per consensus guidelines, no additional follow-up necessary. This recommendation follows the consensus statement: Guidelines for Management of Incidental Pulmonary Nodules Detected on CT Images: From the  Fleischner Society 2017; Radiology 2017; 284:228-243. 2. No acute chest findings. 3. Mild Aortic Atherosclerosis (ICD10-I70.0).   Electronically Signed   By: Richardean Sale M.D.   On: 01/03/2021 13:48 I have personally reviewed the CT images and concur with the findings noted above  Impression: Kara Mitchell is a 70 year old woman with a remote history of tobacco abuse, hiatal hernia, hypothyroidism, arthritis, and lung nodules.   Lung nodules-first noted in 2014.  She has been followed over time with some waxing and waning of the nodules.  No suspicious findings on today's exam.  We will plan to repeat a chest CT in a year.  Tobacco abuse-37.5-pack-year history of smoking.  She quit just over 20 years ago so does not qualify for low-dose lung cancer screening.   Plan: Return in 1 year with CT chest  I spent 20 minutes in review of records, images, and in consultation with [name] today [name], MD [facility] [phone]

## 2021-01-19 DIAGNOSIS — R293 Abnormal posture: Secondary | ICD-10-CM | POA: Diagnosis not present

## 2021-01-19 DIAGNOSIS — M2560 Stiffness of unspecified joint, not elsewhere classified: Secondary | ICD-10-CM | POA: Diagnosis not present

## 2021-01-19 DIAGNOSIS — M542 Cervicalgia: Secondary | ICD-10-CM | POA: Diagnosis not present

## 2021-01-24 DIAGNOSIS — M2392 Unspecified internal derangement of left knee: Secondary | ICD-10-CM | POA: Diagnosis not present

## 2021-01-24 DIAGNOSIS — M25562 Pain in left knee: Secondary | ICD-10-CM | POA: Diagnosis not present

## 2021-01-30 ENCOUNTER — Ambulatory Visit: Payer: 59 | Admitting: Obstetrics & Gynecology

## 2021-01-30 DIAGNOSIS — M1812 Unilateral primary osteoarthritis of first carpometacarpal joint, left hand: Secondary | ICD-10-CM | POA: Diagnosis not present

## 2021-01-30 DIAGNOSIS — M79642 Pain in left hand: Secondary | ICD-10-CM | POA: Diagnosis not present

## 2021-02-01 DIAGNOSIS — M2560 Stiffness of unspecified joint, not elsewhere classified: Secondary | ICD-10-CM | POA: Diagnosis not present

## 2021-02-01 DIAGNOSIS — R293 Abnormal posture: Secondary | ICD-10-CM | POA: Diagnosis not present

## 2021-02-01 DIAGNOSIS — M542 Cervicalgia: Secondary | ICD-10-CM | POA: Diagnosis not present

## 2021-02-02 DIAGNOSIS — G4733 Obstructive sleep apnea (adult) (pediatric): Secondary | ICD-10-CM | POA: Diagnosis not present

## 2021-02-02 DIAGNOSIS — H26493 Other secondary cataract, bilateral: Secondary | ICD-10-CM | POA: Diagnosis not present

## 2021-02-02 DIAGNOSIS — Z961 Presence of intraocular lens: Secondary | ICD-10-CM | POA: Diagnosis not present

## 2021-02-02 DIAGNOSIS — R030 Elevated blood-pressure reading, without diagnosis of hypertension: Secondary | ICD-10-CM | POA: Diagnosis not present

## 2021-02-02 DIAGNOSIS — H43391 Other vitreous opacities, right eye: Secondary | ICD-10-CM | POA: Diagnosis not present

## 2021-02-02 DIAGNOSIS — H43811 Vitreous degeneration, right eye: Secondary | ICD-10-CM | POA: Diagnosis not present

## 2021-02-02 DIAGNOSIS — H43813 Vitreous degeneration, bilateral: Secondary | ICD-10-CM | POA: Diagnosis not present

## 2021-02-02 DIAGNOSIS — H35371 Puckering of macula, right eye: Secondary | ICD-10-CM | POA: Diagnosis not present

## 2021-02-02 DIAGNOSIS — H5319 Other subjective visual disturbances: Secondary | ICD-10-CM | POA: Diagnosis not present

## 2021-02-02 DIAGNOSIS — Z6827 Body mass index (BMI) 27.0-27.9, adult: Secondary | ICD-10-CM | POA: Diagnosis not present

## 2021-02-02 DIAGNOSIS — M542 Cervicalgia: Secondary | ICD-10-CM | POA: Diagnosis not present

## 2021-02-07 DIAGNOSIS — M542 Cervicalgia: Secondary | ICD-10-CM | POA: Diagnosis not present

## 2021-02-08 DIAGNOSIS — M1812 Unilateral primary osteoarthritis of first carpometacarpal joint, left hand: Secondary | ICD-10-CM | POA: Diagnosis not present

## 2021-02-14 DIAGNOSIS — H43391 Other vitreous opacities, right eye: Secondary | ICD-10-CM | POA: Diagnosis not present

## 2021-02-14 DIAGNOSIS — H33321 Round hole, right eye: Secondary | ICD-10-CM | POA: Diagnosis not present

## 2021-02-14 DIAGNOSIS — H43811 Vitreous degeneration, right eye: Secondary | ICD-10-CM | POA: Diagnosis not present

## 2021-02-14 DIAGNOSIS — H5319 Other subjective visual disturbances: Secondary | ICD-10-CM | POA: Diagnosis not present

## 2021-02-15 DIAGNOSIS — M25562 Pain in left knee: Secondary | ICD-10-CM | POA: Diagnosis not present

## 2021-02-23 DIAGNOSIS — M2242 Chondromalacia patellae, left knee: Secondary | ICD-10-CM | POA: Diagnosis not present

## 2021-02-27 DIAGNOSIS — M1812 Unilateral primary osteoarthritis of first carpometacarpal joint, left hand: Secondary | ICD-10-CM | POA: Diagnosis not present

## 2021-03-04 DIAGNOSIS — G4733 Obstructive sleep apnea (adult) (pediatric): Secondary | ICD-10-CM | POA: Diagnosis not present

## 2021-03-09 DIAGNOSIS — M542 Cervicalgia: Secondary | ICD-10-CM | POA: Diagnosis not present

## 2021-03-16 DIAGNOSIS — K219 Gastro-esophageal reflux disease without esophagitis: Secondary | ICD-10-CM | POA: Diagnosis not present

## 2021-03-16 DIAGNOSIS — R911 Solitary pulmonary nodule: Secondary | ICD-10-CM | POA: Diagnosis not present

## 2021-03-16 DIAGNOSIS — E559 Vitamin D deficiency, unspecified: Secondary | ICD-10-CM | POA: Diagnosis not present

## 2021-03-16 DIAGNOSIS — E039 Hypothyroidism, unspecified: Secondary | ICD-10-CM | POA: Diagnosis not present

## 2021-03-16 DIAGNOSIS — M8580 Other specified disorders of bone density and structure, unspecified site: Secondary | ICD-10-CM | POA: Diagnosis not present

## 2021-03-16 DIAGNOSIS — G454 Transient global amnesia: Secondary | ICD-10-CM | POA: Diagnosis not present

## 2021-03-16 DIAGNOSIS — M19042 Primary osteoarthritis, left hand: Secondary | ICD-10-CM | POA: Diagnosis not present

## 2021-03-16 DIAGNOSIS — K635 Polyp of colon: Secondary | ICD-10-CM | POA: Diagnosis not present

## 2021-03-16 DIAGNOSIS — G4733 Obstructive sleep apnea (adult) (pediatric): Secondary | ICD-10-CM | POA: Diagnosis not present

## 2021-03-16 DIAGNOSIS — I700 Atherosclerosis of aorta: Secondary | ICD-10-CM | POA: Diagnosis not present

## 2021-03-16 DIAGNOSIS — E785 Hyperlipidemia, unspecified: Secondary | ICD-10-CM | POA: Diagnosis not present

## 2021-03-24 ENCOUNTER — Encounter: Payer: Self-pay | Admitting: Neurology

## 2021-03-27 ENCOUNTER — Ambulatory Visit: Payer: PPO | Admitting: Neurology

## 2021-04-04 DIAGNOSIS — G4733 Obstructive sleep apnea (adult) (pediatric): Secondary | ICD-10-CM | POA: Diagnosis not present

## 2021-04-05 ENCOUNTER — Ambulatory Visit: Payer: PPO | Admitting: Neurology

## 2021-04-16 DIAGNOSIS — Z961 Presence of intraocular lens: Secondary | ICD-10-CM | POA: Diagnosis not present

## 2021-04-16 DIAGNOSIS — H43813 Vitreous degeneration, bilateral: Secondary | ICD-10-CM | POA: Diagnosis not present

## 2021-04-16 DIAGNOSIS — H35371 Puckering of macula, right eye: Secondary | ICD-10-CM | POA: Diagnosis not present

## 2021-04-16 DIAGNOSIS — H26493 Other secondary cataract, bilateral: Secondary | ICD-10-CM | POA: Diagnosis not present

## 2021-04-19 DIAGNOSIS — L814 Other melanin hyperpigmentation: Secondary | ICD-10-CM | POA: Diagnosis not present

## 2021-04-25 ENCOUNTER — Encounter: Payer: Self-pay | Admitting: Neurology

## 2021-04-25 DIAGNOSIS — H43811 Vitreous degeneration, right eye: Secondary | ICD-10-CM | POA: Diagnosis not present

## 2021-04-25 DIAGNOSIS — H35371 Puckering of macula, right eye: Secondary | ICD-10-CM | POA: Diagnosis not present

## 2021-04-25 DIAGNOSIS — H33321 Round hole, right eye: Secondary | ICD-10-CM | POA: Diagnosis not present

## 2021-04-25 DIAGNOSIS — Z961 Presence of intraocular lens: Secondary | ICD-10-CM | POA: Diagnosis not present

## 2021-04-25 DIAGNOSIS — H43391 Other vitreous opacities, right eye: Secondary | ICD-10-CM | POA: Diagnosis not present

## 2021-04-25 DIAGNOSIS — H26491 Other secondary cataract, right eye: Secondary | ICD-10-CM | POA: Diagnosis not present

## 2021-04-30 ENCOUNTER — Encounter: Payer: Self-pay | Admitting: Neurology

## 2021-05-01 ENCOUNTER — Ambulatory Visit: Payer: PPO | Admitting: Neurology

## 2021-05-01 ENCOUNTER — Other Ambulatory Visit: Payer: Self-pay | Admitting: Neurology

## 2021-05-01 ENCOUNTER — Encounter: Payer: Self-pay | Admitting: Neurology

## 2021-05-01 VITALS — BP 114/70 | HR 76 | Ht 64.0 in | Wt 161.0 lb

## 2021-05-01 DIAGNOSIS — E785 Hyperlipidemia, unspecified: Secondary | ICD-10-CM | POA: Diagnosis not present

## 2021-05-01 DIAGNOSIS — G4733 Obstructive sleep apnea (adult) (pediatric): Secondary | ICD-10-CM | POA: Diagnosis not present

## 2021-05-01 DIAGNOSIS — Z789 Other specified health status: Secondary | ICD-10-CM

## 2021-05-01 DIAGNOSIS — G454 Transient global amnesia: Secondary | ICD-10-CM | POA: Diagnosis not present

## 2021-05-01 DIAGNOSIS — Z9989 Dependence on other enabling machines and devices: Secondary | ICD-10-CM

## 2021-05-01 DIAGNOSIS — I700 Atherosclerosis of aorta: Secondary | ICD-10-CM | POA: Diagnosis not present

## 2021-05-01 DIAGNOSIS — E039 Hypothyroidism, unspecified: Secondary | ICD-10-CM | POA: Diagnosis not present

## 2021-05-01 DIAGNOSIS — R5383 Other fatigue: Secondary | ICD-10-CM

## 2021-05-01 DIAGNOSIS — M8580 Other specified disorders of bone density and structure, unspecified site: Secondary | ICD-10-CM | POA: Diagnosis not present

## 2021-05-01 NOTE — Progress Notes (Signed)
Fax confirmation received for [facility] cpap orders [phone].

## 2021-05-01 NOTE — Patient Instructions (Signed)
It was nice to see you again today.  I am sorry to hear that you are still struggling with fatigue and exhaustion.  Please meet up with your primary care this afternoon and discuss alternative treatment options and reevaluation for your cholesterol management.  Please continue using your autoPAP regularly. While your insurance requires that you use PAP at least 4 hours each night on 70% of the nights, I recommend, that you not skip any nights and use it throughout the night if you can. Getting used to PAP and staying with the treatment long term does take time and patience and discipline. Untreated obstructive sleep apnea when it is moderate to severe can have an adverse impact on cardiovascular health and raise her risk for heart disease, arrhythmias, hypertension, congestive heart failure, stroke and diabetes. Untreated obstructive sleep apnea causes sleep disruption, nonrestorative sleep, and sleep deprivation. This can have an impact on your day to day functioning and cause daytime sleepiness and impairment of cognitive function, memory loss, mood disturbance, and problems focussing. Using PAP regularly can improve these symptoms.  Please follow-up routinely to see one of our nurse practitioners in 6 months.  I will request a different mask through your DME company, [facility].  You may be able to try a nasal mask with a chinstrap.

## 2021-05-01 NOTE — Progress Notes (Signed)
Subjective:    Patient ID: CHAKIRA JACHIM is a 70 y.o. female.  HPI    Interim history:  Ms. Kohen is a 70 year old right-handed woman with an underlying medical history of arthritis, mild sleep apnea, hypothyroidism, lung nodule, plantar fasciitis, diverticulosis, reflux disease, prior smoking, mildly overweight state and history of rosacea, who presents for follow-up consultation of her mild sleep apnea.  She was last seen on 05/31/2020, at which time she reported feeling stable, no recent change in her symptoms or new symptoms from the neurological standpoint, no further episodes similar to TGA.  She had not quite started her AutoPap treatment.  Today, 05/01/2021: I reviewed her AutoPap compliance data from 04/01/2021 through 04/30/2021, which is a total of 30 days, during which time she used her machine 27 days with percent use days greater than 4 hours at 87%, indicating very good compliance with an average usage for days on treatment of 6 hours and 57 minutes, residual AHI at goal at 3.4/h, 95th percentile of pressure at 9.5 cm with a range of 5 to 11 cm with EPR, leak on the higher side with a 95th percentile at 32 L/min.  Leak has increased in the recent past but has been fluctuating.  Her set up date was 02/02/2021 but she needed a different machine as the first machine was not working properly.  She started using her machine on 02/23/2021 and with new equipment.  Reports struggling with a fullface mask, she started off with a F 20 fullface mask but is now using a hybrid style from [employer].  She is still struggling with air leakage and tolerance of the pressure.  She is motivated to continue with treatment.  She reports interim difficulty with her Crestor.  She started feeling joint pain and muscle aches and eventually stopped the Crestor about 2 weeks ago after discussing with her primary care physician.  She has a follow-up appointment today.  She did bring her blood work from the recent past.  I  reviewed her blood test results.  On 03/01/2020 her LDL was 122.  In July 2022 her LDL was 97.  She has had an increase in her triglycerides.  She is trying to eat well and hydrate well but does report that she could do better with her eating habits.  She has been given some dietary advice from the primary care physician's office as I understand but would like to discuss more in depth today at her appointment this afternoon.    The patient's allergies, current medications, family history, past medical history, past social history, past surgical history and problem list were reviewed and updated as appropriate.      Previously (copied from previous notes for reference):   I evaluated her for (TGA) on 05/02/2020, at which time she was referred by her primary care physician, Dr. Forde Dandy.  She had a several hour episode of lapse in memory.  She had experienced significant stress that day.  She does remember being stressed out in crying.  She did not have any symptoms of one-sided weakness or numbness or droopy face or slurring of speech.  She had a nonfocal neurological exam at the time and felt at baseline.  I suggested work-up in the form of brain MRI with and without contrast, EEG and sleep study.  She had not used her CPAP because of difficulty with her mask.  Previously, she was compliant with her CPAP therapy and compliance data was excellent in October 2018.  She had a brain MRI with and without contrast on 05/04/2020 and I reviewed the results: IMPRESSION: Unremarkable MRI scan of the brain  without contrast.  Contrast was administered but postcontrast images were not available at the time of this dictation. Addendum     Postcontrast images were not available for my review until today and did not show any abnormal enhancement within the brain and are unremarkable.   She had an EEG on 05/17/2020 and I reviewed the results: Summary  Normal electroencephalogram, awake, asleep and with activation  procedures. There are no focal lateralizing or epileptiform features.      She had a home sleep test on 05/24/2020, which showed mild obstructive sleep apnea with a total AHI of 14.6/hour and O2 nadir of 86%.   She was advised regarding her test results by phone call.  She was advised to restart using her CPAP.  She requested a new machine and I prescribed an AutoPap machine.     05/02/20: (She) had a recent episode of memory lapse in June.  She had a stressful situation at the time, had recently been told that her dog was terminally ill and needed to be put down.  She did not have recollection of certain events around that time and sending text messages at the time.  Symptoms lasted altogether a few hours.  She had a nail appointment and she has hardly any recollection of.  The attendant at the nail salon told her that she was crying the whole time.  Patient remembers being in touch with her boss who is also her friend and he insisted that she should get checked out as he was worried about a stroke.  She did not have any one-sided weakness or numbness or tingling or fall or droopy face. Some 8 weeks ago she had to put her beloved dog down and this was hard.  She is tearful due to this today. She also has had additional stress regarding her daughter.  Patient reports that the daughter was going through a lot of stress and weight loss but thankfully is better now. She presented to the emergency room on 02/16/2020 for this issue.  I reviewed the emergency room records.  She had a head CT without contrast on 02/16/2020 and I reviewed the results: Impression: No acute intracranial pathology.  She had a nonfocal neurological exam, no slurring of speech.  She was suspected to have transient global amnesia due to stress.  I have previously evaluated her for sleep apnea.  I last saw her in 2018 for this.  She reports that she currently does not use her machine.  She would like to get reevaluated.  She is not sleeping  very well. Since the episode in June, she has felt at baseline, no recurrence or no other similar issues, no recurrent headache. She had a recent appointment with your office, those records are not available for my review today.  She had blood work.  She has been started on low-dose Crestor.  She reports that she had been taking Mobic every day.  By December 2020 she started having stomach problems.  She saw GI for this and has eventually stopped using the Mobic due to side effects. On upper GI endoscopy she was found to have a Schatzki ring which was dilated.  She was also placed on budesonide which she finished.  Her lower GI endoscopy showed no precancerous or cancerous lesions. She has been seeing Dr. Arnoldo Morale for neck pain.  She  has had physical therapy.     06/23/2017: 70 year old right-handed woman with an underlying medical history of esophageal stricture, hypothyroidism, pulmonary nodule, arthritis, knee surgery, status post tonsillectomy and status post low back surgery, overweight state, who presents for follow-up consultation of her obstructive sleep apnea, after recent home sleep testing and starting Pap therapy at home. The patient is unaccompanied today. I first met her on 04/01/2017 at the request of her primary care physician, at which time she reported snoring and daytime somnolence. I suggested we proceed with sleep study testing. She had a home sleep test on 04/16/2017 indicating overall mild sleep apnea with an index of 7.2 per hour, O2 nadir of 78%. I suggested we proceed with AutoPap therapy. She called with problems tolerating the pressure and mask and mouth dryness. I changed her settings to CPAP of 8 cm and addition of chinstrap.   I reviewed her CPAP compliance data from 05/27/2017 through 06/18/2017 which is a total of 23 days, during which time she used her machine 22 days with percent used days greater than 4 hours at 96%, indicating excellent compliance with an average usage of  7 hours and 12 minutes, residual AHI 4.8 per hour, leak high with the 95th percentile at 32.8 L/m on a pressure of 8 cm with EPR of 3. She reports still struggling with her CPAP. She does not like to use it. She is not able to be comfortable with it. She would like to look into using an oral appliance. She saw Dr. Redmond Baseman in ENT on 06/12/17, and he recommended trial of afrin for a few days. She has mild inferior turbinate hypertrophy. On the positive side, she does feel like she is not as sleepy during the day after having used CPAP. She notices the higher leak. She also would like to be able to tolerate treatment better throughout the night.      04/01/17: (She) reports possible snoring and excessive daytime somnolence. I reviewed your office note from 11/20/2016, which you kindly included. She quit smoking in 1994. She is divorced. She lives alone. She has 3 grown children. Family history is not available because she was adopted. She drinks alcohol occasionally, caffeine in the form of coffee about 2 cups per day. Her Epworth sleepiness score is 12 out of 24, fatigue score is 36 out of 63. She has no telltale Sx of RLS and no night to night nocturia. She does not recall dreaming. Her biggest complaint is not waking up rested despite getting about 8 hours of sleep on an average night and being tired during the day. She is able to nap but tries to avoid it. To be fully rested she requires at least 8 hours of sleep and she tries to get 8-1/2 hours on most nights. Bedtime is between 10 and 10:30 PM, wakeup time around 7. She does wake up with leg cramps and has to walk it out typically. Her symptoms have been ongoing for at least a year. She has 1 son and 2 twin daughters, age 66. She had back surgery twice, last time some 2 years ago under Dr. Arnoldo Morale. She had good results with her back surgeries but has to sleep on her back.   Her Past Medical History Is Significant For: Past Medical History:  Diagnosis Date    Arthritis    Complication of anesthesia    no fent/versed   Hiatal hernia    Hypothyroidism    Lung nodule    right, lower-seen  on CT, had second CT 01/06/14-followed by Dr Roxan Hockey   Plantar fasciitis 2019   Left    PONV (postoperative nausea and vomiting)    Rosacea    Strabismus    Thyroid disease    Urinary incontinence     Her Past Surgical History Is Significant For: Past Surgical History:  Procedure Laterality Date   BACK SURGERY  2012   lumb lam   BLEPHAROPLASTY  10/2012   BREAST BIOPSY  1/95   CYSTOCELE REPAIR  2007   DISTAL INTERPHALANGEAL JOINT FUSION Right 12/21/2015   Procedure: DEBRIDEMENT OF RIGHT DISTAL INTERPHALANGEAL JOINT ;  Surgeon: Leanora Cover, MD;  Location: [facility];  Service: Orthopedics;  Laterality: Right;   ENTEROCELE REPAIR  2010   ESOPHAGEAL DILATION  3474;2595   KNEE SURGERY     x2-lt   MASS EXCISION Right 12/21/2015   Procedure: RIGHT INDEX EXCISION MASS;  Surgeon: Leanora Cover, MD;  Location: [facility];  Service: Orthopedics;  Laterality: Right;   OTHER SURGICAL HISTORY  2001   HNP L4-5, Dr. Arnoldo Morale   TONSILLECTOMY AND ADENOIDECTOMY  1974   TRIGGER FINGER RELEASE Right 02/18/2014   Procedure: RELEASE A-1 PULLEY RIGHT THUMB/POSSIBLE INJ TO LEFT THUMB;  Surgeon: Cammie Sickle, MD;  Location: [facility];  Service: Orthopedics;  Laterality: Right;   TRIGGER FINGER RELEASE Left 05/16/2016   Procedure: LEFT THUMB RELEASE TRIGGER FINGER;  Surgeon: Leanora Cover, MD;  Location: [facility];  Service: Orthopedics;  Laterality: Left;   VAGINAL HYSTERECTOMY  05/03/2002   VEIN LIGATION AND STRIPPING  2009    Her Family History Is Significant For: Family History  Adopted: Yes  Problem Relation Age of Onset   Cancer Other         NO FAMILY HX   Sleep apnea Neg Hx     Her Social History Is Significant For: Social History   Socioeconomic History   Marital status: Divorced    Spouse  name: Not on file   Number of children: Not on file   Years of education: Not on file   Highest education level: Not on file  Occupational History   Not on file  Tobacco Use   Smoking status: Former    Packs/day: 1.50    Years: 25.00    Pack years: 37.50    Types: Cigarettes    Quit date: 01/08/1993    Years since quitting: 28.3   Smokeless tobacco: Never  Vaping Use   Vaping Use: Never used  Substance and Sexual Activity   Alcohol use: Yes    Alcohol/week: 1.0 standard drink    Types: 1 Standard drinks or equivalent per week    Comment: rarely   Drug use: No   Sexual activity: Not Currently    Partners: Male    Birth control/protection: Post-menopausal, Surgical    Comment: TVH  Other Topics Concern   Not on file  Social History Narrative   Not on file   Social Determinants of Health   Financial Resource Strain: Not on file  Food Insecurity: Not on file  Transportation Needs: Not on file  Physical Activity: Not on file  Stress: Not on file  Social Connections: Not on file    Her Allergies Are:  Allergies  Allergen Reactions   Fentanyl Other (See Comments)    Low blood pressure   Meloxicam Diarrhea   Neosporin [Neomycin-Bacitracin Zn-Polymyx] Other (See Comments)    whelps  Polysporin [Bacitracin-Polymyxin B] Other (See Comments)    whelps   Tetanus Toxoids Other (See Comments)    fainting   Versed [Midazolam] Other (See Comments)    Low blood pressure  :   Her Current Medications Are:  Outpatient Encounter Medications as of 05/01/2021  Medication Sig   diphenhydrAMINE-APAP, sleep, (TYLENOL PM EXTRA STRENGTH PO) Take by mouth. 1 every night   ibuprofen (ADVIL,MOTRIN) 800 MG tablet Take 1 tablet (800 mg total) by mouth every 8 (eight) hours as needed.   oxybutynin (DITROPAN-XL) 10 MG 24 hr tablet Take 1 tablet (10 mg total) by mouth daily.   pantoprazole (PROTONIX) 40 MG tablet Take 40 mg by mouth daily.   thyroid (ARMOUR) 30 MG tablet Take 30 mg by mouth  daily.   rosuvastatin (CRESTOR) 5 MG tablet Take by mouth. 2 per week (Patient not taking: Reported on 05/01/2021)   No facility-administered encounter medications on file as of 05/01/2021.  :  Review of Systems:  Out of a complete 14 point review of systems, all are reviewed and negative with the exception of these symptoms as listed below:  Review of Systems  Neurological:        Pt is here for follow up visit for CPAP . Pt does not like CPAP. Pt states she still is fatigue. Pt states she had to get another CPAP because the one she had spilled out water .   ESS:8   Objective:  Neurological Exam  Physical Exam Physical Examination:   Vitals:   05/01/21 0921  BP: 114/70  Pulse: 76   General Examination: The patient is a very pleasant 70 y.o. female in no acute distress. She appears well-developed and well-nourished and well groomed.   HEENT: Normocephalic, atraumatic, pupils are equal, round and reactive to light, tracking is well-preserved.  Hearing is grossly intact, face is symmetric with normal facial animation, speech is clear without dysarthria, hypophonia or voice tremor, neck is supple with full range of motion, no carotid bruits.  Airway examination reveals mild mouth dryness, stable findings, tongue protrudes centrally and palate elevates symmetrically.    Chest: Clear to auscultation without wheezing, rhonchi or crackles noted.   Heart: S1+S2+0, regular and normal without murmurs, rubs or gallops noted.    Abdomen: Soft, non-tender and non-distended.   Extremities: There is no pitting edema in the distal lower extremities bilaterally.    Skin: Warm and dry without trophic changes noted.   Musculoskeletal: exam reveals no obvious joint deformities.    Neurologically:  Mental status: The patient is awake, alert and oriented in all 4 spheres. Her immediate and remote memory, attention, language skills and fund of knowledge are appropriate. There is no evidence of  aphasia, agnosia, apraxia or anomia. Speech is clear with normal prosody and enunciation. Thought process is linear. Mood is normal and affect is normal.  Cranial nerves II - XII are as described above under HEENT exam.  Motor exam: Normal bulk, strength and tone is noted. Fine motor skills and coordination: grossly intact.  Cerebellar testing: No dysmetria or intention tremor. There is no truncal or gait ataxia.  Sensory exam: intact to light touch in the upper and lower extremities.  Gait, station and balance: She stands easily. No veering to one side is noted. No leaning to one side is noted. Posture is age-appropriate and stance is narrow based. Gait shows normal stride length and normal pace. No problems turning are noted.    Assessment and plan:  In summary,  LAMEKA DISLA is a very pleasant 70 year old female with an underlying medical history of arthritis, mild sleep apnea, hypothyroidism, lung nodule, plantar fasciitis, diverticulosis, reflux disease, prior smoking, mildly overweight state and history of rosacea, who presents for follow-up consultation of her mild sleep apnea.  She has established treatment with an AutoPap machine since August 2022.  She is compliant with treatment but is still struggling with tolerance.  She is having a hard time with a full facemask.  She is advised to seek a mask refit appointment with her DME provider for a nasal interface perhaps with a chinstrap.  She has good compliance, she has good apnea reduction with treatment.  She is encouraged to continue with her treatment.  She has not had any acute neurological symptoms since her last visit thankfully.  She has a follow-up appointment today with her primary care physician.  She stopped taking her Crestor about 2 weeks ago due to joint pain and muscle aches.  She had recent blood work which I reviewed.  She is encouraged to continue the discussion of lipid management with her primary care.  She also may benefit from  additional advise as to dietary modification.  Her triglycerides have increased a little bit.  She has not had any acute neurological symptoms, she is commended for her treatment adherence with her AutoPap machine.  Work-up in June 2021 for transient global amnesia included a CT head, and we pursued a carotid Doppler ultrasound, brain MRI and EEG.  Home sleep testing revealed mild obstructive sleep apnea.  She is compliant with treatment and apnea scores a good.  I suggested we pursue a mask refit through her DME provider.  I wrote for a nasal mask with chinstrap.  She is advised to follow-up with her primary care physician as scheduled.  She is advised to follow-up to see one of our nurse practitioners in 6 months routinely for sleep apnea recheck.  She reports ongoing exhaustion.  She is advised that hopefully once she is able to consistently use her AutoPap, her tiredness may also improve.  She has had blood work through her primary care physician on a regular basis.  I answered all her questions today and she was in agreement. I spent 40 minutes in total face-to-face time and in reviewing records during pre-charting, more than 50% of which was spent in counseling and coordination of care, reviewing test results, reviewing medications and treatment regimen and/or in discussing or reviewing the diagnosis of OSA, the prognosis and treatment options. Pertinent laboratory and imaging test results that were available during this visit with the patient were reviewed by me and considered in my medical decision making (see chart for details).

## 2021-05-05 DIAGNOSIS — G4733 Obstructive sleep apnea (adult) (pediatric): Secondary | ICD-10-CM | POA: Diagnosis not present

## 2021-05-22 ENCOUNTER — Encounter: Payer: Self-pay | Admitting: Neurology

## 2021-05-30 DIAGNOSIS — G4733 Obstructive sleep apnea (adult) (pediatric): Secondary | ICD-10-CM | POA: Diagnosis not present

## 2021-06-04 DIAGNOSIS — G4733 Obstructive sleep apnea (adult) (pediatric): Secondary | ICD-10-CM | POA: Diagnosis not present

## 2021-06-25 DIAGNOSIS — E785 Hyperlipidemia, unspecified: Secondary | ICD-10-CM | POA: Diagnosis not present

## 2021-06-25 DIAGNOSIS — Z0000 Encounter for general adult medical examination without abnormal findings: Secondary | ICD-10-CM | POA: Diagnosis not present

## 2021-07-05 DIAGNOSIS — G4733 Obstructive sleep apnea (adult) (pediatric): Secondary | ICD-10-CM | POA: Diagnosis not present

## 2021-07-27 DIAGNOSIS — H43813 Vitreous degeneration, bilateral: Secondary | ICD-10-CM | POA: Diagnosis not present

## 2021-07-27 DIAGNOSIS — H26491 Other secondary cataract, right eye: Secondary | ICD-10-CM | POA: Diagnosis not present

## 2021-07-27 DIAGNOSIS — Z961 Presence of intraocular lens: Secondary | ICD-10-CM | POA: Diagnosis not present

## 2021-07-27 DIAGNOSIS — H35371 Puckering of macula, right eye: Secondary | ICD-10-CM | POA: Diagnosis not present

## 2021-07-31 DIAGNOSIS — Z1231 Encounter for screening mammogram for malignant neoplasm of breast: Secondary | ICD-10-CM | POA: Diagnosis not present

## 2021-08-03 ENCOUNTER — Encounter (HOSPITAL_BASED_OUTPATIENT_CLINIC_OR_DEPARTMENT_OTHER): Payer: Self-pay | Admitting: *Deleted

## 2021-08-04 DIAGNOSIS — G4733 Obstructive sleep apnea (adult) (pediatric): Secondary | ICD-10-CM | POA: Diagnosis not present

## 2021-09-04 DIAGNOSIS — G4733 Obstructive sleep apnea (adult) (pediatric): Secondary | ICD-10-CM | POA: Diagnosis not present

## 2021-09-14 DIAGNOSIS — M542 Cervicalgia: Secondary | ICD-10-CM | POA: Diagnosis not present

## 2021-09-25 DIAGNOSIS — D1801 Hemangioma of skin and subcutaneous tissue: Secondary | ICD-10-CM | POA: Diagnosis not present

## 2021-09-25 DIAGNOSIS — D2371 Other benign neoplasm of skin of right lower limb, including hip: Secondary | ICD-10-CM | POA: Diagnosis not present

## 2021-09-25 DIAGNOSIS — L814 Other melanin hyperpigmentation: Secondary | ICD-10-CM | POA: Diagnosis not present

## 2021-09-25 DIAGNOSIS — L821 Other seborrheic keratosis: Secondary | ICD-10-CM | POA: Diagnosis not present

## 2021-09-25 DIAGNOSIS — D225 Melanocytic nevi of trunk: Secondary | ICD-10-CM | POA: Diagnosis not present

## 2021-09-25 DIAGNOSIS — L738 Other specified follicular disorders: Secondary | ICD-10-CM | POA: Diagnosis not present

## 2021-09-25 DIAGNOSIS — D2272 Melanocytic nevi of left lower limb, including hip: Secondary | ICD-10-CM | POA: Diagnosis not present

## 2021-10-05 DIAGNOSIS — G4733 Obstructive sleep apnea (adult) (pediatric): Secondary | ICD-10-CM | POA: Diagnosis not present

## 2021-10-31 NOTE — Patient Instructions (Addendum)
Please continue using your CPAP regularly. While your insurance requires that you use CPAP at least 4 hours each night on 70% of the nights, I recommend, that you not skip any nights and use it throughout the night if you can. Getting used to CPAP and staying with the treatment long term does take time and patience and discipline. Untreated obstructive sleep apnea when it is moderate to severe can have an adverse impact on cardiovascular health and raise her risk for heart disease, arrhythmias, hypertension, congestive heart failure, stroke and diabetes. Untreated obstructive sleep apnea causes sleep disruption, nonrestorative sleep, and sleep deprivation. This can have an impact on your day to day functioning and cause daytime sleepiness and impairment of cognitive function, memory loss, mood disturbance, and problems focussing. Using CPAP regularly can improve these symptoms. ? ?We will repeat your home sleep study to determine need for CPAP since your recent weight loss.  ? ?We may consider Inspire versus oral appliance pending home sleep study results.  ? ?Follow up pending sleep results  ?

## 2021-10-31 NOTE — Progress Notes (Signed)
PATIENT: Kara Mitchell DOB: 1951-05-27  REASON FOR VISIT: follow up HISTORY FROM: patient  Chief Complaint  Patient presents with   Follow-up    Rm 1, alone. Here for CPAP f/u. Pt reports doing well now that she has found a mask that works.      HISTORY OF PRESENT ILLNESS:  11/01/21 ALL:  Kara Mitchell is a 71 y.o. female here today for follow up for OSA on CPAP. Her download below demonstrates acceptable compliance for days worn and for > 4 hours. Her AHI is 2.2 on autotitration 5-11 cmH2O. She reports that she doesn't love the machine and has expressed interest in stopping CPAP or trying the Antelope device. She has not noted any significant benefit with using CPAP. She feels that she fights with the machine more than she rests with it. Her HST revealed that she had mild sleep apnea with an AHI of 14.6 and O2 nadir of 86%. She has a hx of transient global amnesia. Workup has been unremarkable and she feels event was triggered by stress. She has recently lost some weight and is interested in retesting at this time.     HISTORY: (copied from Dr Guadelupe Sabin previous note)  Kara Mitchell is a 71 year old right-handed woman with an underlying medical history of arthritis, mild sleep apnea, hypothyroidism, lung nodule, plantar fasciitis, diverticulosis, reflux disease, prior smoking, mildly overweight state and history of rosacea, who presents for follow-up consultation of her mild sleep apnea.  She was last seen on 05/31/2020, at which time she reported feeling stable, no recent change in her symptoms or new symptoms from the neurological standpoint, no further episodes similar to TGA.  She had not quite started her AutoPap treatment.   Today, 05/01/2021: I reviewed her AutoPap compliance data from 04/01/2021 through 04/30/2021, which is a total of 30 days, during which time she used her machine 27 days with percent use days greater than 4 hours at 87%, indicating very good compliance with an average  usage for days on treatment of 6 hours and 57 minutes, residual AHI at goal at 3.4/h, 95th percentile of pressure at 9.5 cm with a range of 5 to 11 cm with EPR, leak on the higher side with a 95th percentile at 32 L/min.  Leak has increased in the recent past but has been fluctuating.  Her set up date was 02/02/2021 but she needed a different machine as the first machine was not working properly.  She started using her machine on 02/23/2021 and with new equipment.  Reports struggling with a fullface mask, she started off with a F 20 fullface mask but is now using a hybrid style from [employer].  She is still struggling with air leakage and tolerance of the pressure.  She is motivated to continue with treatment.  She reports interim difficulty with her Crestor.  She started feeling joint pain and muscle aches and eventually stopped the Crestor about 2 weeks ago after discussing with her primary care physician.  She has a follow-up appointment today.  She did bring her blood work from the recent past.  I reviewed her blood test results.  On 03/01/2020 her LDL was 122.  In July 2022 her LDL was 97.  She has had an increase in her triglycerides.  She is trying to eat well and hydrate well but does report that she could do better with her eating habits.  She has been given some dietary advice from the primary care physician's office  as I understand but would like to discuss more in depth today at her appointment this afternoon.    REVIEW OF SYSTEMS: Out of a complete 14 system review of symptoms, the patient complains only of the following symptoms, none and all other reviewed systems are negative.  ESS: 4  ALLERGIES: Allergies  Allergen Reactions   Fentanyl Other (See Comments)    Low blood pressure   Meloxicam Diarrhea   Neosporin [Neomycin-Bacitracin Zn-Polymyx] Other (See Comments)    whelps   Polysporin [Bacitracin-Polymyxin B] Other (See Comments)    whelps   Tetanus Toxoids Other (See Comments)     fainting   Versed [Midazolam] Other (See Comments)    Low blood pressure    HOME MEDICATIONS: Outpatient Medications Prior to Visit  Medication Sig Dispense Refill   diphenhydrAMINE-APAP, sleep, (TYLENOL PM EXTRA STRENGTH PO) Take by mouth. 1 every night     ibuprofen (ADVIL,MOTRIN) 800 MG tablet Take 1 tablet (800 mg total) by mouth every 8 (eight) hours as needed. 40 tablet 0   oxybutynin (DITROPAN-XL) 10 MG 24 hr tablet Take 1 tablet (10 mg total) by mouth daily. 90 tablet 4   pantoprazole (PROTONIX) 40 MG tablet Take 40 mg by mouth daily.     thyroid (ARMOUR) 30 MG tablet Take 30 mg by mouth daily.     No facility-administered medications prior to visit.    PAST MEDICAL HISTORY: Past Medical History:  Diagnosis Date   Arthritis    Complication of anesthesia    no fent/versed   Hiatal hernia    Hypothyroidism    Lung nodule    right, lower-seen on CT, had second CT 01/06/14-followed by Dr Roxan Hockey   Plantar fasciitis 2019   Left    PONV (postoperative nausea and vomiting)    Rosacea    Strabismus    Thyroid disease    Urinary incontinence     PAST SURGICAL HISTORY: Past Surgical History:  Procedure Laterality Date   BACK SURGERY  2012   lumb lam   BLEPHAROPLASTY  10/2012   BREAST BIOPSY  1/95   CYSTOCELE REPAIR  2007   DISTAL INTERPHALANGEAL JOINT FUSION Right 12/21/2015   Procedure: DEBRIDEMENT OF RIGHT DISTAL INTERPHALANGEAL JOINT ;  Surgeon: Leanora Cover, MD;  Location: [facility];  Service: Orthopedics;  Laterality: Right;   ENTEROCELE REPAIR  2010   ESOPHAGEAL DILATION  [id number]   KNEE SURGERY     x2-lt   MASS EXCISION Right 12/21/2015   Procedure: RIGHT INDEX EXCISION MASS;  Surgeon: Leanora Cover, MD;  Location: [facility];  Service: Orthopedics;  Laterality: Right;   OTHER SURGICAL HISTORY  2001   HNP L4-5, Dr. Arnoldo Morale   TONSILLECTOMY AND ADENOIDECTOMY  1974   TRIGGER FINGER RELEASE Right 02/18/2014   Procedure: RELEASE A-1  PULLEY RIGHT THUMB/POSSIBLE INJ TO LEFT THUMB;  Surgeon: Cammie Sickle, MD;  Location: [facility];  Service: Orthopedics;  Laterality: Right;   TRIGGER FINGER RELEASE Left 05/16/2016   Procedure: LEFT THUMB RELEASE TRIGGER FINGER;  Surgeon: Leanora Cover, MD;  Location: [facility];  Service: Orthopedics;  Laterality: Left;   VAGINAL HYSTERECTOMY  05/03/2002   VEIN LIGATION AND STRIPPING  2009    FAMILY HISTORY: Family History  Adopted: Yes  Problem Relation Age of Onset   Cancer Other         NO FAMILY HX   Sleep apnea Neg Hx     SOCIAL HISTORY: Social  History   Socioeconomic History   Marital status: Divorced    Spouse name: Not on file   Number of children: Not on file   Years of education: Not on file   Highest education level: Not on file  Occupational History   Not on file  Tobacco Use   Smoking status: Former    Packs/day: 1.50    Years: 25.00    Pack years: 37.50    Types: Cigarettes    Quit date: 01/08/1993    Years since quitting: 28.8   Smokeless tobacco: Never  Vaping Use   Vaping Use: Never used  Substance and Sexual Activity   Alcohol use: Yes    Alcohol/week: 1.0 standard drink    Types: 1 Standard drinks or equivalent per week    Comment: rarely   Drug use: No   Sexual activity: Not Currently    Partners: Male    Birth control/protection: Post-menopausal, Surgical    Comment: TVH  Other Topics Concern   Not on file  Social History Narrative   Not on file   Social Determinants of Health   Financial Resource Strain: Not on file  Food Insecurity: Not on file  Transportation Needs: Not on file  Physical Activity: Not on file  Stress: Not on file  Social Connections: Not on file  Intimate Partner Violence: Not on file     PHYSICAL EXAM  Vitals:   11/01/21 0914  BP: 135/74  Pulse: (!) 58  Weight: 147 lb 8 oz (66.9 kg)  Height: '5\' 4"'$  (1.626 m)   Body mass index is 25.32 kg/m.  Generalized: Well  developed, in no acute distress  Cardiology: normal rate and rhythm, no murmur noted Respiratory: clear to auscultation bilaterally  Neurological examination  Mentation: Alert oriented to time, place, history taking. Follows all commands speech and language fluent Cranial nerve II-XII: Pupils were equal round reactive to light. Extraocular movements were full, visual field were full  Motor: The motor testing reveals 5 over 5 strength of all 4 extremities. Good symmetric motor tone is noted throughout.  Gait and station: Gait is normal.    DIAGNOSTIC DATA (LABS, IMAGING, TESTING) - I reviewed patient records, labs, notes, testing and imaging myself where available.  No flowsheet data found.   Lab Results  Component Value Date   WBC 6.8 02/16/2020   HGB 14.0 02/16/2020   HCT 43.2 02/16/2020   MCV 93.7 02/16/2020   PLT 240 02/16/2020      Component Value Date/Time   NA 145 (H) 05/02/2020 1405   K 4.3 05/02/2020 1405   CL 106 05/02/2020 1405   CO2 27 05/02/2020 1405   GLUCOSE 81 05/02/2020 1405   GLUCOSE 103 (H) 02/16/2020 1855   BUN 12 05/02/2020 1405   CREATININE 0.53 (L) 05/02/2020 1405   CREATININE 0.70 01/05/2014 1119   CALCIUM 9.2 05/02/2020 1405   PROT 6.2 05/02/2020 1405   ALBUMIN 4.7 05/02/2020 1405   AST 18 05/02/2020 1405   ALT 15 05/02/2020 1405   ALKPHOS 85 05/02/2020 1405   BILITOT 0.2 05/02/2020 1405   GFRNONAA 98 05/02/2020 1405   GFRAA 113 05/02/2020 1405   No results found for: CHOL, HDL, LDLCALC, LDLDIRECT, TRIG, CHOLHDL No results found for: HGBA1C No results found for: VITAMINB12 No results found for: TSH   ASSESSMENT AND PLAN 71 y.o. year old female  has a past medical history of Arthritis, Complication of anesthesia, Hiatal hernia, Hypothyroidism, Lung nodule, Plantar fasciitis (2019), PONV (postoperative  nausea and vomiting), Rosacea, Strabismus, Thyroid disease, and Urinary incontinence. here with     ICD-10-CM   1. OSA on CPAP  G47.33 Home  sleep test   Z99.89     2. Difficulty using continuous positive airway pressure (CPAP) device  Z78.9 Home sleep test       Merideth Abbey is doing well on CPAP therapy. Compliance report reveals 83% daily and 4 hour usage. We have discussed her concerns with CPAP therapy. She reports weight loss around 20 pounds and wishes to repeat HST to evaluate continued need for CPAP. We have discussed possibility of Inspire versus oral appliance if appropriate based on study results. She has previous failed oral appliance but willing to reconsider. For now, she was encouraged to continue using CPAP nightly and for greater than 4 hours each night. Risks of untreated sleep apnea review and education materials provided. Healthy lifestyle habits encouraged. She will repeat the sleep study. She will follow up pending HST results. She verbalizes understanding and agreement with this plan.    Orders Placed This Encounter  Procedures   Home sleep test    Dr Guadelupe Sabin patient. She does not note any significant benefit with CPAP therapy. Wishing to consider Inspire or oral appliance (previously fitted with Dr Redmond Baseman) pending results.    Standing Status:   Future    Standing Expiration Date:   11/02/2022    Order Specific Question:   Where should this test be performed:    Answer:   [facility]     No orders of the defined types were placed in this encounter.     Debbora Presto, FNP-C 11/01/2021, 10:28 AM [facility] [address] [place], [place] [address] [phone]

## 2021-11-01 ENCOUNTER — Encounter: Payer: Self-pay | Admitting: Family Medicine

## 2021-11-01 ENCOUNTER — Ambulatory Visit: Payer: PPO | Admitting: Family Medicine

## 2021-11-01 VITALS — BP 135/74 | HR 58 | Ht 64.0 in | Wt 147.5 lb

## 2021-11-01 DIAGNOSIS — Z789 Other specified health status: Secondary | ICD-10-CM

## 2021-11-01 DIAGNOSIS — Z9989 Dependence on other enabling machines and devices: Secondary | ICD-10-CM | POA: Diagnosis not present

## 2021-11-01 DIAGNOSIS — G4733 Obstructive sleep apnea (adult) (pediatric): Secondary | ICD-10-CM | POA: Diagnosis not present

## 2021-11-02 DIAGNOSIS — G4733 Obstructive sleep apnea (adult) (pediatric): Secondary | ICD-10-CM | POA: Diagnosis not present

## 2021-11-15 DIAGNOSIS — E039 Hypothyroidism, unspecified: Secondary | ICD-10-CM | POA: Diagnosis not present

## 2021-11-15 DIAGNOSIS — Z0000 Encounter for general adult medical examination without abnormal findings: Secondary | ICD-10-CM | POA: Diagnosis not present

## 2021-11-15 DIAGNOSIS — E785 Hyperlipidemia, unspecified: Secondary | ICD-10-CM | POA: Diagnosis not present

## 2021-11-15 DIAGNOSIS — E559 Vitamin D deficiency, unspecified: Secondary | ICD-10-CM | POA: Diagnosis not present

## 2021-11-22 DIAGNOSIS — G4733 Obstructive sleep apnea (adult) (pediatric): Secondary | ICD-10-CM | POA: Diagnosis not present

## 2021-11-22 DIAGNOSIS — K635 Polyp of colon: Secondary | ICD-10-CM | POA: Diagnosis not present

## 2021-11-22 DIAGNOSIS — K219 Gastro-esophageal reflux disease without esophagitis: Secondary | ICD-10-CM | POA: Diagnosis not present

## 2021-11-22 DIAGNOSIS — E559 Vitamin D deficiency, unspecified: Secondary | ICD-10-CM | POA: Diagnosis not present

## 2021-11-22 DIAGNOSIS — G454 Transient global amnesia: Secondary | ICD-10-CM | POA: Diagnosis not present

## 2021-11-22 DIAGNOSIS — E785 Hyperlipidemia, unspecified: Secondary | ICD-10-CM | POA: Diagnosis not present

## 2021-11-22 DIAGNOSIS — R911 Solitary pulmonary nodule: Secondary | ICD-10-CM | POA: Diagnosis not present

## 2021-11-22 DIAGNOSIS — M8580 Other specified disorders of bone density and structure, unspecified site: Secondary | ICD-10-CM | POA: Diagnosis not present

## 2021-11-22 DIAGNOSIS — E039 Hypothyroidism, unspecified: Secondary | ICD-10-CM | POA: Diagnosis not present

## 2021-11-22 DIAGNOSIS — I700 Atherosclerosis of aorta: Secondary | ICD-10-CM | POA: Diagnosis not present

## 2021-11-22 DIAGNOSIS — H43811 Vitreous degeneration, right eye: Secondary | ICD-10-CM | POA: Diagnosis not present

## 2021-12-03 DIAGNOSIS — G4733 Obstructive sleep apnea (adult) (pediatric): Secondary | ICD-10-CM | POA: Diagnosis not present

## 2021-12-04 ENCOUNTER — Other Ambulatory Visit: Payer: Self-pay | Admitting: Thoracic Surgery (Cardiothoracic Vascular Surgery)

## 2021-12-04 DIAGNOSIS — R911 Solitary pulmonary nodule: Secondary | ICD-10-CM

## 2021-12-06 DIAGNOSIS — G4733 Obstructive sleep apnea (adult) (pediatric): Secondary | ICD-10-CM | POA: Diagnosis not present

## 2021-12-17 ENCOUNTER — Ambulatory Visit (HOSPITAL_BASED_OUTPATIENT_CLINIC_OR_DEPARTMENT_OTHER): Payer: PPO | Admitting: Obstetrics & Gynecology

## 2021-12-20 DIAGNOSIS — R490 Dysphonia: Secondary | ICD-10-CM | POA: Diagnosis not present

## 2021-12-20 DIAGNOSIS — R1310 Dysphagia, unspecified: Secondary | ICD-10-CM | POA: Diagnosis not present

## 2021-12-20 DIAGNOSIS — K625 Hemorrhage of anus and rectum: Secondary | ICD-10-CM | POA: Diagnosis not present

## 2021-12-20 DIAGNOSIS — Z86010 Personal history of colonic polyps: Secondary | ICD-10-CM | POA: Diagnosis not present

## 2021-12-20 DIAGNOSIS — Z1211 Encounter for screening for malignant neoplasm of colon: Secondary | ICD-10-CM | POA: Diagnosis not present

## 2021-12-20 DIAGNOSIS — K200 Eosinophilic esophagitis: Secondary | ICD-10-CM | POA: Diagnosis not present

## 2021-12-25 ENCOUNTER — Ambulatory Visit (INDEPENDENT_AMBULATORY_CARE_PROVIDER_SITE_OTHER): Payer: PPO | Admitting: Obstetrics & Gynecology

## 2021-12-25 ENCOUNTER — Encounter (HOSPITAL_BASED_OUTPATIENT_CLINIC_OR_DEPARTMENT_OTHER): Payer: Self-pay | Admitting: Obstetrics & Gynecology

## 2021-12-25 VITALS — BP 128/77 | HR 57 | Ht 64.0 in | Wt 142.0 lb

## 2021-12-25 DIAGNOSIS — Z01419 Encounter for gynecological examination (general) (routine) without abnormal findings: Secondary | ICD-10-CM

## 2021-12-25 DIAGNOSIS — M8580 Other specified disorders of bone density and structure, unspecified site: Secondary | ICD-10-CM

## 2021-12-25 DIAGNOSIS — Z90710 Acquired absence of both cervix and uterus: Secondary | ICD-10-CM

## 2021-12-25 DIAGNOSIS — Z780 Asymptomatic menopausal state: Secondary | ICD-10-CM | POA: Diagnosis not present

## 2021-12-25 DIAGNOSIS — N3281 Overactive bladder: Secondary | ICD-10-CM | POA: Diagnosis not present

## 2021-12-25 DIAGNOSIS — Z0282 Encounter for adoption services: Secondary | ICD-10-CM

## 2021-12-25 MED ORDER — OXYBUTYNIN CHLORIDE ER 10 MG PO TB24: 10.0000 mg | ORAL_TABLET | Freq: Every day | ORAL | 4 refills | Status: DC

## 2021-12-25 NOTE — Progress Notes (Signed)
71 y.o. G42P2003 Divorced White or Caucasian female here for breast and pelvic exam.  I am also following her for h/o OAB and h/o TVH.  Is on Ditropan XL.  Has helped bladder control but feels she is having increased urinary urgency.  Would like to consider increasing dosage.  Feel pt would benefit from PT.  She is wiling to proceed.  Denies vaginal bleeding. ? ?Heading to [place] tomorrow to see a first cousin so referral placed but asked for pt to be called in more than two weeks.  After PT has been completed, then will see if needs to have dosage adjustment for Ditropan.  Considering side effects profile, hopefully PT will help. ? ?Patient's last menstrual period was 08/26/2001 (lmp unknown).          ?Sexually active: No.  ?H/O STD:  no ? ?Health Maintenance: ?PCP:  2022.  Last wellness appt was 11/17/2021.  Did blood work at that appt:  had blood work done a week prior ?Vaccines are up to date:  reviewed with pt ?Colonoscopy:  was scheduled last week.  There was an inadequate prep.  Scheduled for 02/19/2022. ?MMG:  07/2021 ?BMD:  does with Dr. Forde Dandy ?Last pap smear:  11/2020   ?H/o abnormal pap smear:  no ? ? reports that she quit smoking about 28 years ago. Her smoking use included cigarettes. She has a 37.50 pack-year smoking history. She has never used smokeless tobacco. She reports current alcohol use of about 1.0 standard drink per week. She reports that she does not use drugs. ? ?Past Medical History:  ?Diagnosis Date  ? Arthritis   ? Complication of anesthesia   ? no fent/versed  ? Hiatal hernia   ? Hypothyroidism   ? Lung nodule   ? right, lower-seen on CT, had second CT 01/06/14-followed by Dr Roxan Hockey  ? Plantar fasciitis 2019  ? Left   ? PONV (postoperative nausea and vomiting)   ? Rosacea   ? Strabismus   ? Thyroid disease   ? Urinary incontinence   ? ? ?Past Surgical History:  ?Procedure Laterality Date  ? BACK SURGERY  2012  ? lumb lam  ? BLEPHAROPLASTY  10/2012  ? BREAST BIOPSY  1/95  ? CYSTOCELE  REPAIR  2007  ? DISTAL INTERPHALANGEAL JOINT FUSION Right 12/21/2015  ? Procedure: DEBRIDEMENT OF RIGHT DISTAL INTERPHALANGEAL JOINT ;  Surgeon: Leanora Cover, MD;  Location: [facility];  Service: Orthopedics;  Laterality: Right;  ? ENTEROCELE REPAIR  2010  ? ESOPHAGEAL DILATION  [id number]  ? KNEE SURGERY    ? x2-lt  ? MASS EXCISION Right 12/21/2015  ? Procedure: RIGHT INDEX EXCISION MASS;  Surgeon: Leanora Cover, MD;  Location: [facility];  Service: Orthopedics;  Laterality: Right;  ? OTHER SURGICAL HISTORY  2001  ? HNP L4-5, Dr. Arnoldo Morale  ? TONSILLECTOMY AND ADENOIDECTOMY  1974  ? TRIGGER FINGER RELEASE Right 02/18/2014  ? Procedure: RELEASE A-1 PULLEY RIGHT THUMB/POSSIBLE INJ TO LEFT THUMB;  Surgeon: Cammie Sickle, MD;  Location: [facility];  Service: Orthopedics;  Laterality: Right;  ? TRIGGER FINGER RELEASE Left 05/16/2016  ? Procedure: LEFT THUMB RELEASE TRIGGER FINGER;  Surgeon: Leanora Cover, MD;  Location: [facility];  Service: Orthopedics;  Laterality: Left;  ? VAGINAL HYSTERECTOMY  05/03/2002  ? VEIN LIGATION AND STRIPPING  2009  ? ? ?Current Outpatient Medications  ?Medication Sig Dispense Refill  ? diphenhydrAMINE-APAP, sleep, (TYLENOL PM EXTRA STRENGTH PO) Take  by mouth. 1 every night    ? ibuprofen (ADVIL,MOTRIN) 800 MG tablet Take 1 tablet (800 mg total) by mouth every 8 (eight) hours as needed. 40 tablet 0  ? oxybutynin (DITROPAN-XL) 10 MG 24 hr tablet Take 1 tablet (10 mg total) by mouth daily. 90 tablet 4  ? pantoprazole (PROTONIX) 40 MG tablet Take 40 mg by mouth daily.    ? thyroid (ARMOUR) 30 MG tablet Take 30 mg by mouth daily.    ? ?No current facility-administered medications for this visit.  ? ? ?Family History  ?Adopted: Yes  ?Problem Relation Age of Onset  ? Cancer Other   ?      NO FAMILY HX  ? Sleep apnea Neg Hx   ? ? ?Review of Systems  ?All other systems reviewed and are negative. ? ?Exam:   ?BP 128/77 (BP Location: Right Arm,  Patient Position: Sitting, Cuff Size: Normal)   Pulse (!) 57   Ht '5\' 4"'$  (1.626 m) Comment: reported  Wt 142 lb (64.4 kg)   LMP 08/26/2001 (LMP Unknown)   BMI 24.37 kg/m?   Height: '5\' 4"'$  (162.6 cm) (reported) ? ?General appearance: alert, cooperative and appears stated age ?Breasts: normal appearance, no masses or tenderness ?Abdomen: soft, non-tender; bowel sounds normal; no masses,  no organomegaly ?Lymph nodes: Cervical, supraclavicular, and axillary nodes normal.  No abnormal inguinal nodes palpated ?Neurologic: Grossly normal ? ?Pelvic: External genitalia:  no lesions ?             Urethra:  normal appearing urethra with no masses, tenderness or lesions ?             Bartholins and Skenes: normal    ?             Vagina: atrophic changes noted, sling under urethra very easily palpable, rectocele noted ?             Cervix: absent ?             Pap taken: No. ?Bimanual Exam:  Uterus:  uterus absent ?             Adnexa: no mass, fullness, tenderness ?              Rectovaginal: Confirms ?              Anus:  normal sphincter tone, no lesions ? ?Chaperone, Octaviano Batty, CMA, was present for exam. ? ?Assessment/Plan: ?1. Encntr for gyn exam (general) (routine) w/o abn findings ?- pap neg 2022.  Guidelines reviewed.   ?- MMG up to date ?- pt is scheduled for colonoscopy 01/2022 ?- lab work done with Dr. Forde Dandy ?- vaccines reviewed ? ?2. Postmenopausal ?- no HRT ? ?3. OAB (overactive bladder) ?- rx for oxybutinyn  ?- Ambulatory referral to Physical Therapy ? ?4. History of total vaginal hysterectomy (TVH) ? ?5. Adopted ? ?6. Osteopenia, unspecified location ? ? ?

## 2022-01-02 DIAGNOSIS — G4733 Obstructive sleep apnea (adult) (pediatric): Secondary | ICD-10-CM | POA: Diagnosis not present

## 2022-01-10 ENCOUNTER — Ambulatory Visit
Admission: RE | Admit: 2022-01-10 | Discharge: 2022-01-10 | Disposition: A | Discharge status: Home / Self Care | Payer: PPO | Source: Ambulatory Visit | Attending: Thoracic Surgery (Cardiothoracic Vascular Surgery) | Admitting: Thoracic Surgery (Cardiothoracic Vascular Surgery)

## 2022-01-10 DIAGNOSIS — I700 Atherosclerosis of aorta: Secondary | ICD-10-CM | POA: Diagnosis not present

## 2022-01-10 DIAGNOSIS — R911 Solitary pulmonary nodule: Secondary | ICD-10-CM | POA: Diagnosis not present

## 2022-01-15 ENCOUNTER — Ambulatory Visit: Payer: PPO | Admitting: Thoracic Surgery (Cardiothoracic Vascular Surgery)

## 2022-01-15 VITALS — BP 133/86 | HR 59 | Resp 20 | Ht 64.0 in

## 2022-01-15 DIAGNOSIS — R911 Solitary pulmonary nodule: Secondary | ICD-10-CM

## 2022-01-15 NOTE — Progress Notes (Signed)
[address]       [place] [address]             [phone]     HPI: Kara Mitchell returns for a scheduled follow-up visit  Kara Mitchell is a 71 year old woman with a remote history of tobacco abuse (37 pack years, quit 1995), lung nodules, hypothyroidism, and hiatal hernia.  She was first noted to have a lung nodule in 2014.  That was in the superior segment of the left lower lobe.  It did not change over 2 years.  Over time she developed other nodules that have waxed and waned.  Most recently there was a new 3 mm groundglass opacity in the right upper lobe.  I last saw her in May 2022.  Since then she has been feeling well.  She not had any respiratory issues.  No change in appetite or weight loss.  She was put on a statin, but had bad side effects from that.  She had questions about the aortic atherosclerosis noted on CT and whether she should try to go back on that medication..  Past Medical History:  Diagnosis Date   Arthritis    Complication of anesthesia    no fent/versed   Hiatal hernia    Hypothyroidism    Lung nodule    right, lower-seen on CT, had second CT 01/06/14-followed by Dr Roxan Hockey   Plantar fasciitis 2019   Left    PONV (postoperative nausea and vomiting)    Rosacea    Strabismus    Thyroid disease    Urinary incontinence     Current Outpatient Medications  Medication Sig Dispense Refill   diphenhydrAMINE-APAP, sleep, (TYLENOL PM EXTRA STRENGTH PO) Take by mouth. 1 every night     ibuprofen (ADVIL,MOTRIN) 800 MG tablet Take 1 tablet (800 mg total) by mouth every 8 (eight) hours as needed. 40 tablet 0   oxybutynin (DITROPAN-XL) 10 MG 24 hr tablet Take 1 tablet (10 mg total) by mouth daily. 90 tablet 4   pantoprazole (PROTONIX) 40 MG tablet Take 40 mg by mouth daily.     thyroid (ARMOUR) 30 MG tablet Take 30 mg by mouth daily.     No current facility-administered medications for this visit.    Physical Exam BP 133/86 (BP  Location: Left Arm, Patient Position: Sitting)   Pulse (!) 59   Resp 20   Ht '5\' 4"'$  (1.626 m)   LMP 08/26/2001 (LMP Unknown)   SpO2 95% Comment: RA  BMI 24.28 kg/m  71 year old woman in no acute distress Alert and oriented x3 with no focal deficits Lungs clear with equal breath sounds bilaterally Cardiac regular rate and rhythm Cervical or supraclavicular adenopathy  Diagnostic Tests: CT CHEST WITHOUT CONTRAST   TECHNIQUE: Multidetector CT imaging of the chest was performed following the standard protocol without IV contrast.   RADIATION DOSE REDUCTION: This exam was performed according to the departmental dose-optimization program which includes automated exposure control, adjustment of the mA and/or kV according to patient size and/or use of iterative reconstruction technique.   COMPARISON:  Jan 03, 2021.   FINDINGS: Cardiovascular: Calcified aortic atherosclerosis, mild. Normal heart size without pericardial effusion or thickening. No aortic dilation.   Central pulmonary vessels are normal caliber. Limited assessment of cardiovascular structures given lack of intravenous contrast.   Mediastinum/Nodes: No thoracic inlet, axillary, mediastinal or hilar adenopathy. Esophagus grossly normal.   Lungs/Pleura: Granuloma in the LEFT lung base. Airways are patent. No  consolidation. No pleural effusion.   Very subtle ground-glass nodule in the RIGHT upper lobe measuring approximately 3 mm is stable dating back to 2021. No new or suspicious pulmonary nodules.   Upper Abdomen: No acute findings relative visualized portions the liver, gallbladder, pancreas, spleen, adrenal glands or of the kidneys   Stable homogeneously water dense cyst in the interpolar LEFT kidney measuring 2 cm for which no additional follow-up is recommended compatible with simple cyst on prior MRI. No adenopathy in the upper abdomen.   Musculoskeletal: No acute bone finding. No destructive bone  process. Spinal degenerative changes.   IMPRESSION: 1. Very subtle ground-glass nodule in the RIGHT upper lobe measuring approximately 3 mm is stable dating back to 2021. Also stable since 2018, stable for greater than 4 years and compatible with benign finding for which no additional follow-up is recommended based on current guidelines. 2. Aortic atherosclerosis.   Aortic Atherosclerosis (ICD10-I70.0).     Electronically Signed   By: Zetta Bills M.D.   On: 01/10/2022 10:44 I personally reviewed the CT images.  Stable lung nodule.  Minimal thoracic aortic atherosclerosis.  Impression: Kara Mitchell is a 71 year old woman with a history of remote tobacco abuse.  She smoked about a pack and 1/2 to 2 packs a day before quitting in 1995.  I have been following her since 2014 for some waxing and waning lung nodules.  On today's scan there are no new or suspicious nodules.  Given her smoking history I think repeating a CT in a year to rule out any nodules and just ensure stability of her other findings would be appropriate.  Thoracic aortic atherosclerosis-she does have some calcified plaque in her thoracic aorta.  It is really very minimal.  I do not think it is worth trying to treat her with the medication is causing severe issues.  She has a very healthy lifestyle overall.  Plan: Follow-up in 1 year with CT chest  [name], MD [facility] [phone]

## 2022-01-16 ENCOUNTER — Ambulatory Visit: Payer: Self-pay

## 2022-01-22 NOTE — Therapy (Unsigned)
OUTPATIENT PHYSICAL THERAPY FEMALE PELVIC EVALUATION   Patient Name: Kara Mitchell MRN: [id number] DOB:05/26/1951, 71 y.o., female Today's Date: 01/23/2022   PT End of Session - 01/23/22 1535     Visit Number 1    Date for PT Re-Evaluation 04/17/22    Authorization Type healt[employer] advantage    Authorization - Visit Number 1    Authorization - Number of Visits 10    PT Start Time 0930    PT Stop Time 1015    PT Time Calculation (min) 45 min    Activity Tolerance Patient tolerated treatment well    Behavior During Therapy [facility] for tasks assessed/performed             Past Medical History:  Diagnosis Date   Arthritis    Complication of anesthesia    no fent/versed   Hiatal hernia    Hypothyroidism    Lung nodule    right, lower-seen on CT, had second CT 01/06/14-followed by Dr Roxan Hockey   Plantar fasciitis 2019   Left    PONV (postoperative nausea and vomiting)    Rosacea    Strabismus    Thyroid disease    Urinary incontinence    Past Surgical History:  Procedure Laterality Date   BACK SURGERY  2012   lumb lam   BLEPHAROPLASTY  10/2012   BREAST BIOPSY  1/95   CYSTOCELE REPAIR  2007   DISTAL INTERPHALANGEAL JOINT FUSION Right 12/21/2015   Procedure: DEBRIDEMENT OF RIGHT DISTAL INTERPHALANGEAL JOINT ;  Surgeon: Leanora Cover, MD;  Location: [place];  Service: Orthopedics;  Laterality: Right;   ENTEROCELE REPAIR  2010   ESOPHAGEAL DILATION  9147;8295   KNEE SURGERY     x2-lt   MASS EXCISION Right 12/21/2015   Procedure: RIGHT INDEX EXCISION MASS;  Surgeon: Leanora Cover, MD;  Location: [place];  Service: Orthopedics;  Laterality: Right;   OTHER SURGICAL HISTORY  2001   HNP L4-5, Dr. Arnoldo Morale   TONSILLECTOMY AND ADENOIDECTOMY  1974   TRIGGER FINGER RELEASE Right 02/18/2014   Procedure: RELEASE A-1 PULLEY RIGHT THUMB/POSSIBLE INJ TO LEFT THUMB;  Surgeon: Cammie Sickle, MD;  Location: [place];  Service:  Orthopedics;  Laterality: Right;   TRIGGER FINGER RELEASE Left 05/16/2016   Procedure: LEFT THUMB RELEASE TRIGGER FINGER;  Surgeon: Leanora Cover, MD;  Location: [place];  Service: Orthopedics;  Laterality: Left;   VAGINAL HYSTERECTOMY  05/03/2002   VEIN LIGATION AND STRIPPING  2009   Patient Active Problem List   Diagnosis Date Noted   Diverticulosis 10/03/2019   History of adenomatous polyp of colon 10/03/2019   OSA on CPAP 06/06/2017   GERD (gastroesophageal reflux disease) 02/17/2017   Osteoarthritis of finger of right hand 01/29/2016   Incidental lung nodule, less than or equal to 67m 07/12/2014   Blepharoptosis 08/31/2012    PCP: SReynold Bowen MD  REFERRING PROVIDER: MMegan SalonMD  REFERRING DIAG: N32.81 (ICD-10-CM) - OAB (overactive bladder)  THERAPY DIAG:  Muscle weakness (generalized)  Cramp and spasm  Urinary urgency  Rationale for Evaluation and Treatment Rehabilitation  ONSET DATE: 01/24/2012  SUBJECTIVE:  SUBJECTIVE STATEMENT: Patient gets the urgency and has to get to the bathroom. Goes to stretch zone for tight hamstring.  Fluid intake: Yes: water    Patient confirms identification and approves PT to assess pelvic floor and treatment Yes   PAIN:  Are you having pain? No  PRECAUTIONS: None  WEIGHT BEARING RESTRICTIONS No  FALLS:  Has patient fallen in last 6 months? 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  Stream: Strong Urgency: Yes: when she gets a real strong urge will leak urine 1x per day Frequency: urinates every 2 hours Leakage: Urge to void and Walking to the bathroom Pads: No   PREGNANCY Vaginal deliveries 1 set of twins, 1 single Tearing No  PROLAPSE Rectocele      OBJECTIVE:   DIAGNOSTIC FINDINGS:  Atrophic changes noted; sling uder urethra very easily palpable    COGNITION:  Overall cognitive status: Within functional limits for tasks assessed       POSTURE:  Good  LUMBARAROM/PROM  A/PROM A/PROM  eval  Flexion   Extension Decreased by 25%  Right lateral flexion Decreased by 25%  Left lateral flexion Decreased by 25%  Right rotation Decreased by 25%  Left rotation Decreased by 25%   (Blank rows = not tested)  LOWER EXTREMITY ROM:  Passive ROM Right eval Left eval  Hip external rotation 40 40   (Blank rows = not tested)  LOWER EXTREMITY MMT:  MMT Right eval Left eval  Hip flexion 4/5 4/5  Hip extension 4/5 3+/5  Hip abduction 3+/5 4/5  Hip adduction 4/5 4/5   PELVIC MMT:   MMT eval  Vaginal 2/5 except on the lateral sides are 1/5  (Blank rows = not tested)        PALPATION:   General  left ilium is higher than right. Decreased contraction of the lower abdominal.                 External Perineal Exam tightness on the sides of the bladder                             Internal Pelvic Floor difficulty with relaxation of the pelvic floor muscles, tenderness located in the ilicoccygeus  TONE: increased  PROLAPSE: Rectocele  TODAY'S TREATMENT  EVAL finished eval   PATIENT EDUCATION:  Education details: Access Code: [email or web address]; educated patient on vaginal moisturizers Person educated: Patient Education method: Explanation, Demonstration, and Handouts Education comprehension: verbalized understanding and returned demonstration   HOME  EXERCISE PROGRAM: Access Code: [email or web address] URL: [email or web address] Date: 01/23/2022 Prepared by: Earlie Counts  Exercises - Supine Diaphragmatic Breathing  - 3 x daily - 7 x weekly - 1 sets - 10 reps  ASSESSMENT:  CLINICAL IMPRESSION: Patient is a 71 y.o. female who was seen today for physical therapy evaluation and treatment for overactive bladder. Patient has had difficulty with overactive bladder for 10 years but recently has become worse. She has urgency and needs to go to the bathroom right away. She goes to the bathroom every 2 hours during the day and  1-2 times during the night. She will leak urine as she is walking to the commode. She has weakness in her hips. She has trouble contracting her lower abdominal and the upper abdominals will contract more. Pelvic floor strength is 2/5 and laterally is 1/5. She has fascial restrictions around the bladder and sides of the introitus. Patient has difficulty with relaxing her pelvic floor with diaphragmatic breathing. Patient will benefit from skilled therapy to improve pelvic floor coordination and reduce the urgency to void.    OBJECTIVE IMPAIRMENTS decreased activity tolerance, decreased coordination, decreased endurance, decreased strength, and increased fascial restrictions.   ACTIVITY LIMITATIONS continence and toileting  PARTICIPATION LIMITATIONS:  walking to the bathroom  PERSONAL FACTORS Hypothyroidism; Cystocele repair 2007; Enterocele repair 2010; Vaginal Hysterectomy 05/03/2002; Osteopenia are also affecting patient's functional outcome.   REHAB POTENTIAL: Excellent  CLINICAL DECISION MAKING: Stable/uncomplicated  EVALUATION COMPLEXITY: Low   GOALS: Goals reviewed with patient? Yes  SHORT TERM GOALS: Target date: 02/20/2022  Patient is educated on vaginal moisturizers to improve vaginal health.  Baseline: Goal status: INITIAL  2.  Patient educated on diaphragmatic breathing and is able to relax her pelvic  floor as she does it. Baseline:  Goal status: INITIAL  3.  Patient is independent with hip stretches to elongate the pelvic floor.  Baseline:  Goal status: INITIAL   LONG TERM GOALS: Target date: 04/17/2022   Patient is independent with advanced HEP for core strength to relax her pelvic floor.  Baseline:  Goal status: INITIAL  2.  Patient urgency to go to the bathroom decreased >/= 80% due to behavioral techniques and improved elongation of tissue.  Baseline:  Goal status: INITIAL  3.  Patient is able to walk to the bathroom when she has the urge to urinate without leaking due to improve pelvic floor strength >/= 3/5 and good hug of the therapist finger.  Baseline:  Goal status: INITIAL  4.  Patient is able to fully relax and contract her pelvic floor so she has improved muscle coordination.  Baseline:  Goal status: INITIAL    PLAN: PT FREQUENCY: 1x/week  PT DURATION: 12 weeks  PLANNED INTERVENTIONS: Therapeutic exercises, Therapeutic activity, Neuromuscular re-education, Patient/Family education, Joint mobilization, Dry Needling, Moist heat, Biofeedback, and Manual therapy  PLAN FOR NEXT SESSION: manual work to pelvic floor, along the bladder and sides of the introitus; work on urogenital diaphragm and relaxation and work on pelvic drop, review vaginal moisturizers   Earlie Counts, PT 01/23/22 5:10 PM

## 2022-01-23 ENCOUNTER — Ambulatory Visit: Payer: PPO | Attending: Obstetrics & Gynecology | Admitting: Physical Therapy

## 2022-01-23 ENCOUNTER — Encounter: Payer: Self-pay | Admitting: Physical Therapy

## 2022-01-23 DIAGNOSIS — R252 Cramp and spasm: Secondary | ICD-10-CM

## 2022-01-23 DIAGNOSIS — M6281 Muscle weakness (generalized): Secondary | ICD-10-CM

## 2022-01-23 DIAGNOSIS — R3915 Urgency of urination: Secondary | ICD-10-CM | POA: Diagnosis not present

## 2022-01-23 DIAGNOSIS — N3281 Overactive bladder: Secondary | ICD-10-CM | POA: Insufficient documentation

## 2022-01-23 NOTE — Patient Instructions (Signed)
Moisturizers They are used in the vagina to hydrate the mucous membrane that make up the vaginal canal. Designed to keep a more normal acid balance (ph) Once placed in the vagina, it will last between two to three days.  Use 2-3 times per week at bedtime  Ingredients to avoid is glycerin and fragrance, can increase chance of infection Should not be used just before sex due to causing irritation Most are gels administered either in a tampon-shaped applicator or as a vaginal suppository. They are non-hormonal.   Types of Moisturizers(internal use)  Vitamin E vaginal suppositories- Whole foods, Amazon Moist Again Coconut oil- can break down condoms Julva- (Do no use if on Tamoxifen) amazon Yes moisturizer- amazon NeuEve Silk , NeuEve Silver for menopausal or over 65 (if have severe vaginal atrophy or cancer treatments use NeuEve Silk for  1 month than move to [employer])- [employer], [place].com Olive and Bee intimate cream- www.oliveandbee.com.au Mae vaginal moisturizer- Amazon Aloe    Creams to use externally on the Vulva area [employer] (good for for cancer patients that had radiation to the area)- [place] or [employer].FlyingBasics.com.br V-magic cream - amazon Julva-amazon Vital "V Wild Yam salve ( help moisturize and help with thinning vulvar area, does have Orange by Irwin Brakeman labial moisturizer (Amazon,  Coconut or olive oil aloe   Things to avoid in the vaginal area Do not use things to irritate the vulvar area No lotions just specialized creams for the vulva area- Neogyn, V-magic, No soaps; can use Aveeno or Calendula cleanser if needed. Must be gentle No deodorants No douches Good to sleep without underwear to let the vaginal area to air out No scrubbing: spread the lips to let warm water rinse over labias and pat dry [facility] [address], [place], [place] [address] Phone # [phone] Fax [phone]

## 2022-01-28 ENCOUNTER — Encounter: Payer: Self-pay | Admitting: Physical Therapy

## 2022-01-28 ENCOUNTER — Ambulatory Visit: Payer: PPO | Attending: Obstetrics & Gynecology | Admitting: Physical Therapy

## 2022-01-28 DIAGNOSIS — M6281 Muscle weakness (generalized): Secondary | ICD-10-CM | POA: Insufficient documentation

## 2022-01-28 DIAGNOSIS — R252 Cramp and spasm: Secondary | ICD-10-CM | POA: Diagnosis not present

## 2022-01-28 DIAGNOSIS — R3915 Urgency of urination: Secondary | ICD-10-CM | POA: Diagnosis not present

## 2022-01-28 NOTE — Therapy (Signed)
OUTPATIENT PHYSICAL THERAPY TREATMENT NOTE   Patient Name: Kara Mitchell MRN: [id number] DOB:Mar 06, 1951, 71 y.o., female Today's Date: 01/28/2022  PCP: Reynold Bowen, MD REFERRING PROVIDER: Megan Salon MD  END OF SESSION:   PT End of Session - 01/28/22 1612     Visit Number 2    Date for PT Re-Evaluation 04/17/22    Authorization Type healt[employer] advantage    Authorization - Visit Number 2    Authorization - Number of Visits 10    PT Start Time 0177    PT Stop Time 1707    PT Time Calculation (min) 52 min    Activity Tolerance Patient tolerated treatment well    Behavior During Therapy [facility] for tasks assessed/performed             Past Medical History:  Diagnosis Date   Arthritis    Complication of anesthesia    no fent/versed   Hiatal hernia    Hypothyroidism    Lung nodule    right, lower-seen on CT, had second CT 01/06/14-followed by Dr Roxan Hockey   Plantar fasciitis 2019   Left    PONV (postoperative nausea and vomiting)    Rosacea    Strabismus    Thyroid disease    Urinary incontinence    Past Surgical History:  Procedure Laterality Date   BACK SURGERY  2012   lumb lam   BLEPHAROPLASTY  10/2012   BREAST BIOPSY  1/95   CYSTOCELE REPAIR  2007   DISTAL INTERPHALANGEAL JOINT FUSION Right 12/21/2015   Procedure: DEBRIDEMENT OF RIGHT DISTAL INTERPHALANGEAL JOINT ;  Surgeon: Leanora Cover, MD;  Location: [place];  Service: Orthopedics;  Laterality: Right;   ENTEROCELE REPAIR  2010   ESOPHAGEAL DILATION  9390;3009   KNEE SURGERY     x2-lt   MASS EXCISION Right 12/21/2015   Procedure: RIGHT INDEX EXCISION MASS;  Surgeon: Leanora Cover, MD;  Location: [place];  Service: Orthopedics;  Laterality: Right;   OTHER SURGICAL HISTORY  2001   HNP L4-5, Dr. Arnoldo Morale   TONSILLECTOMY AND ADENOIDECTOMY  1974   TRIGGER FINGER RELEASE Right 02/18/2014   Procedure: RELEASE A-1 PULLEY RIGHT THUMB/POSSIBLE INJ TO LEFT THUMB;  Surgeon: Cammie Sickle, MD;  Location: [place];  Service: Orthopedics;  Laterality: Right;   TRIGGER FINGER RELEASE Left 05/16/2016   Procedure: LEFT THUMB RELEASE TRIGGER FINGER;  Surgeon: Leanora Cover, MD;  Location: [place];  Service: Orthopedics;  Laterality: Left;   VAGINAL HYSTERECTOMY  05/03/2002   VEIN LIGATION AND STRIPPING  2009   Patient Active Problem List   Diagnosis Date Noted   Diverticulosis 10/03/2019   History of adenomatous polyp of colon 10/03/2019   OSA on CPAP 06/06/2017   GERD (gastroesophageal reflux disease) 02/17/2017   Osteoarthritis of finger of right hand 01/29/2016   Incidental lung nodule, less than or equal to 28m 07/12/2014   Blepharoptosis 08/31/2012    REFERRING DIAG: N32.81 (ICD-10-CM) - OAB (overactive bladder)  THERAPY DIAG:  Muscle weakness (generalized)  Cramp and spasm  Urinary urgency  Rationale for Evaluation and Treatment Rehabilitation  PERTINENT HISTORY: Hypothyroidism; Cystocele repair 2007; Enterocele repair 2010; Vaginal Hysterectomy 05/03/2002; Osteopenia  PRECAUTIONS: None  SUBJECTIVE: I got soap for the vaginal area. I have been doing my exercises. The urgency is not as strong and not as often.   PATIENT GOALS not have to take medicine for overactive bladder  URINATION Pain with urination:  No Fully empty bladder: No, will void before and after brushing her teeth with urine coming out each time.  Stream: Strong Urgency: Yes: when she gets a real strong urge will leak urine 1x per day Frequency: urinates every 2 hours Leakage: Urge to void and Walking to the bathroom Pads: No OBJECTIVE: (objective measures completed at initial evaluation unless otherwise dated)  DIAGNOSTIC FINDINGS:  Atrophic changes noted; sling uder urethra very easily palpable       COGNITION:            Overall cognitive status: Within functional limits for tasks assessed                      POSTURE:  Good    LUMBARAROM/PROM   A/PROM A/PROM  eval  Extension Decreased by 25%  Right lateral flexion Decreased by 25%  Left lateral flexion Decreased by 25%  Right rotation Decreased by 25%  Left rotation Decreased by 25%   (Blank rows = not tested)   LOWER EXTREMITY ROM:   Passive ROM Right eval Left eval  Hip external rotation 40 40   (Blank rows = not tested)   LOWER EXTREMITY MMT:   MMT Right eval Left eval  Hip flexion 4/5 4/5  Hip extension 4/5 3+/5  Hip abduction 3+/5 4/5  Hip adduction 4/5 4/5    PELVIC MMT:   MMT eval 01/28/2022  Vaginal 2/5 except on the lateral sides are 1/5 2/5  (Blank rows = not tested)         PALPATION:   General  left ilium is higher than right. Decreased contraction of the lower abdominal.                  External Perineal Exam tightness on the sides of the bladder                             Internal Pelvic Floor difficulty with relaxation of the pelvic floor muscles, tenderness located in the ilicoccygeus   TONE: increased   PROLAPSE: Rectocele   TODAY'S TREATMENT  01/28/2022 Manual: Myofascial release: fascial release of the urogenital diaphragm going through the restrictions.  Internal pelvic floor techniques: Patient confirms identification and approves PT to assess pelvic floor and treatment through the vaginal canal. Manual work on the sides of the introitus and fascial release along the urethra sphincter.  Neuromuscular re-education: Pelvic floor contraction training:tactile cues to the pelvic floor with contraction holding for 5 sec 10 x Self-care: Patient was educated on vaginal moisturizers and where to place them and how they improve vaginal health       PATIENT EDUCATION:  01/28/2022 Education details: Access Code: [email or web address]; educated patient on vaginal moisturizers Person educated: Patient Education method: Explanation, Demonstration, and Handouts Education comprehension: verbalized understanding and returned  demonstration     HOME EXERCISE PROGRAM: 01/28/2022 Access Code: [email or web address] URL: [email or web address] Date: 01/23/2022 Prepared by: Earlie Counts   Exercises - Supine Diaphragmatic Breathing  - 3 x daily - 7 x weekly - 1 sets - 10 reps   ASSESSMENT:   CLINICAL IMPRESSION: Patient is a 71 y.o. female who was seen today for physical therapy evaluation and treatment for overactive bladder. Pelvic floor strength is now 2/5 all around and better hug of therapist finger. Patient understands how to use the vaginal moisturizers. She is using a vaginal wash that is appropriate for the pelvic floor.  Patient will benefit  from skilled therapy to improve pelvic floor coordination and reduce the urgency to void.      OBJECTIVE IMPAIRMENTS decreased activity tolerance, decreased coordination, decreased endurance, decreased strength, and increased fascial restrictions.    ACTIVITY LIMITATIONS continence and toileting   PARTICIPATION LIMITATIONS:  walking to the bathroom   PERSONAL FACTORS Hypothyroidism; Cystocele repair 2007; Enterocele repair 2010; Vaginal Hysterectomy 05/03/2002; Osteopenia are also affecting patient's functional outcome.    REHAB POTENTIAL: Excellent   CLINICAL DECISION MAKING: Stable/uncomplicated   EVALUATION COMPLEXITY: Low     GOALS: Goals reviewed with patient? Yes   SHORT TERM GOALS: Target date: 02/20/2022   Patient is educated on vaginal moisturizers to improve vaginal health.  Baseline: Goal status: Met 01/28/2022   2.  Patient educated on diaphragmatic breathing and is able to relax her pelvic floor as she does it. Baseline:  Goal status: INITIAL   3.  Patient is independent with hip stretches to elongate the pelvic floor.  Baseline:  Goal status: INITIAL     LONG TERM GOALS: Target date: 04/17/2022    Patient is independent with advanced HEP for core strength to relax her pelvic floor.  Baseline:  Goal status: INITIAL   2.  Patient urgency  to go to the bathroom decreased >/= 80% due to behavioral techniques and improved elongation of tissue.  Baseline:  Goal status: INITIAL   3.  Patient is able to walk to the bathroom when she has the urge to urinate without leaking due to improve pelvic floor strength >/= 3/5 and good hug of the therapist finger.  Baseline:  Goal status: INITIAL   4.  Patient is able to fully relax and contract her pelvic floor so she has improved muscle coordination.  Baseline:  Goal status: INITIAL       PLAN: PT FREQUENCY: 1x/week   PT DURATION: 12 weeks   PLANNED INTERVENTIONS: Therapeutic exercises, Therapeutic activity, Neuromuscular re-education, Patient/Family education, Joint mobilization, Dry Needling, Moist heat, Biofeedback, and Manual therapy   PLAN FOR NEXT SESSION: manual work to pelvic floor, ; pelvic drop, hip stretches and back; urge to void, toilet with pelvic motion .   Earlie Counts, PT 01/28/22 5:17 PM

## 2022-01-28 NOTE — Patient Instructions (Signed)
Moisturizers They are used in the vagina to hydrate the mucous membrane that make up the vaginal canal. Designed to keep a more normal acid balance (ph) Once placed in the vagina, it will last between two to three days.  Use 2-3 times per week at bedtime  Ingredients to avoid is glycerin and fragrance, can increase chance of infection Should not be used just before sex due to causing irritation Most are gels administered either in a tampon-shaped applicator or as a vaginal suppository. They are non-hormonal.   Types of Moisturizers(internal use) use 2 times per week  Vitamin E vaginal suppositories- Whole foods, Amazon Moist Again Coconut oil- can break down condoms Julva- (Do no use if on Tamoxifen) amazon Yes moisturizer- amazon NeuEve Silk , NeuEve Silver for menopausal or over 65 (if have severe vaginal atrophy or cancer treatments use NeuEve Silk for  1 month than move to [employer])- [employer], [place].com Olive and Bee intimate cream- www.oliveandbee.com.au Mae vaginal moisturizer- Amazon Aloe Good clean love   Creams to use externally on the Vulva area- use daily  [place] [employer] (good for for cancer patients that had radiation to the area)- [place] or [employer].FlyingBasics.com.br V-magic cream - amazon Julva-amazon Vital "V Wild Yam salve ( help moisturize and help with thinning vulvar area, does have [place] by Irwin Brakeman labial moisturizer (Amazon,  Coconut or olive oil Aloe Good clean love Enchanted Rose by Intimate Rose   Things to avoid in the vaginal area Do not use things to irritate the vulvar area No lotions just specialized creams for the vulva area- Neogyn, V-magic, No soaps; can use Aveeno or Calendula cleanser if needed. Must be gentle No deodorants No douches Good to sleep without underwear to let the vaginal area to air out No scrubbing: spread the lips to let warm water rinse  over labias and pat dry   [facility] [address], [place], [place] [address] Phone # [phone] Fax [phone]

## 2022-01-29 ENCOUNTER — Telehealth: Payer: Self-pay | Admitting: Family Medicine

## 2022-01-29 IMAGING — CT CT CHEST W/O CM
2 of 4 series · 11 of 36 positions shown, 13 images · non-contrast
Comparison: CT chest 01/03/2020 and 03/04/2017

CLINICAL DATA: Follow up pulmonary nodule. No history of
malignancy.

EXAM:
CT CHEST WITHOUT CONTRAST
TECHNIQUE: Multidetector CT imaging of the chest was performed following the
standard protocol without IV contrast.

[Series 2: chest 2.00 br40 s3 · axial · 0.53mm/px · z∈[+1639,+1907]mm · 8 of 160 slices shown, 10 images (1 of 2)]
[im 13/160  mediastinal]
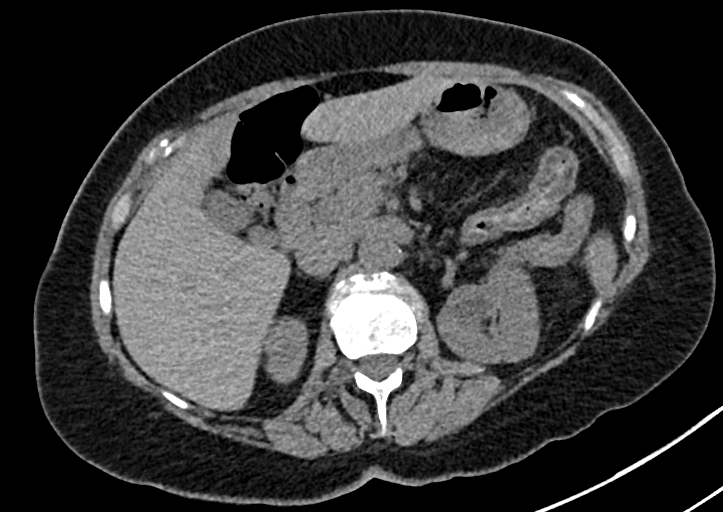
[im 13/160  lung]
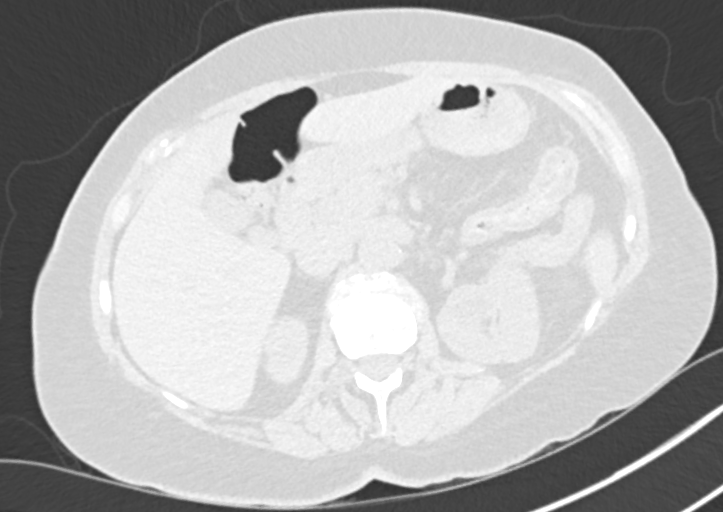
[im 37/160  lung]
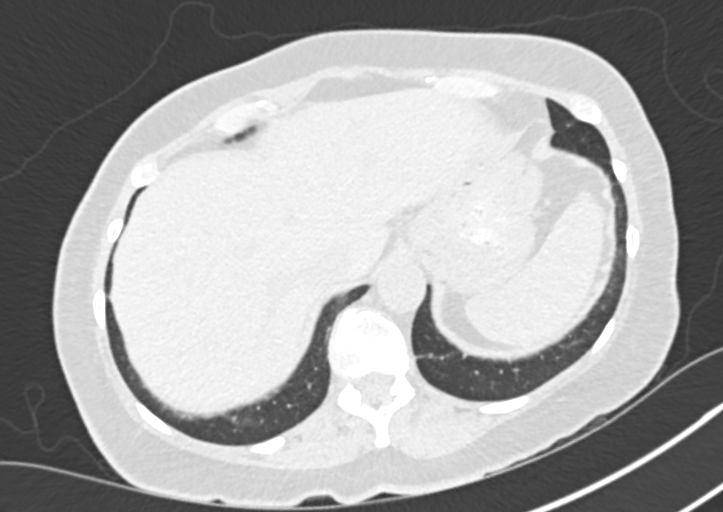
[im 49/160  lung]
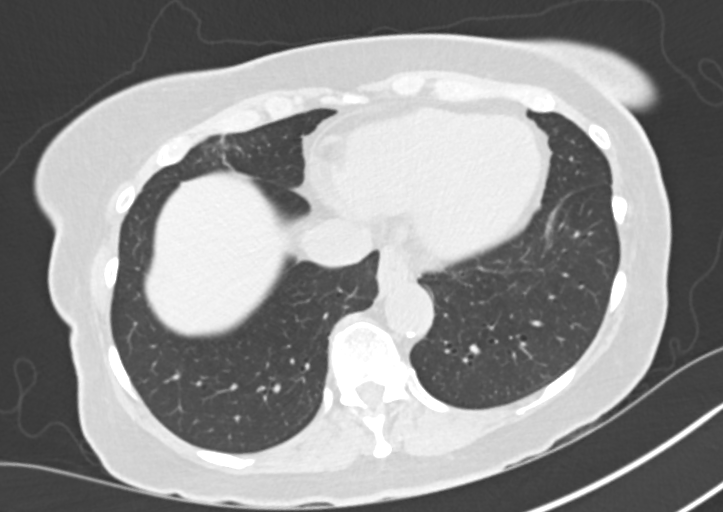
[im 74/160  lung]
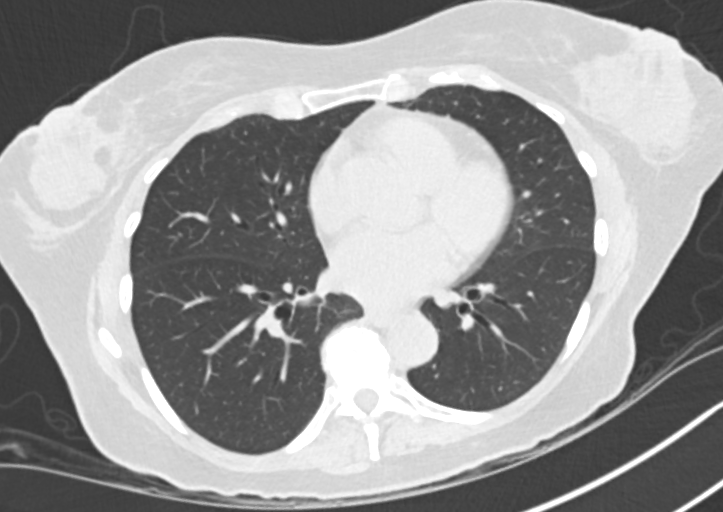
[im 86/160  mediastinal]
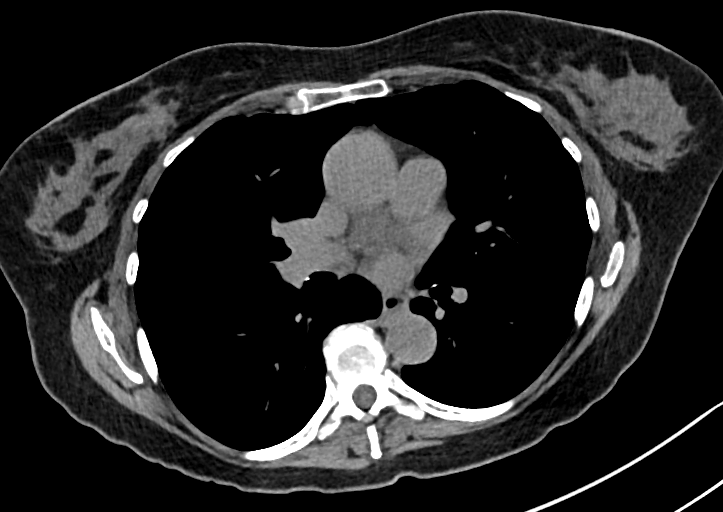
[im 86/160  lung]
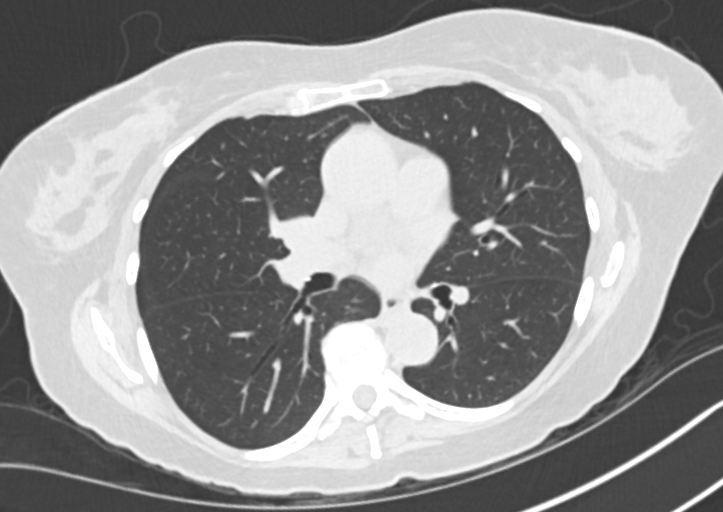
[im 111/160  lung]
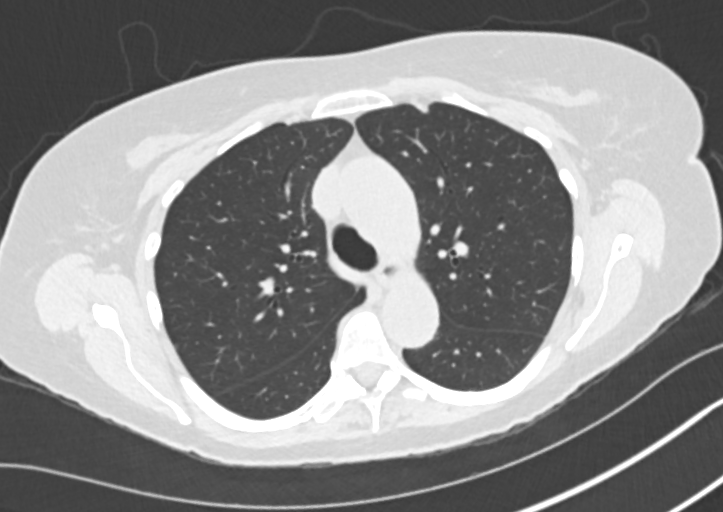
[im 123/160  lung]
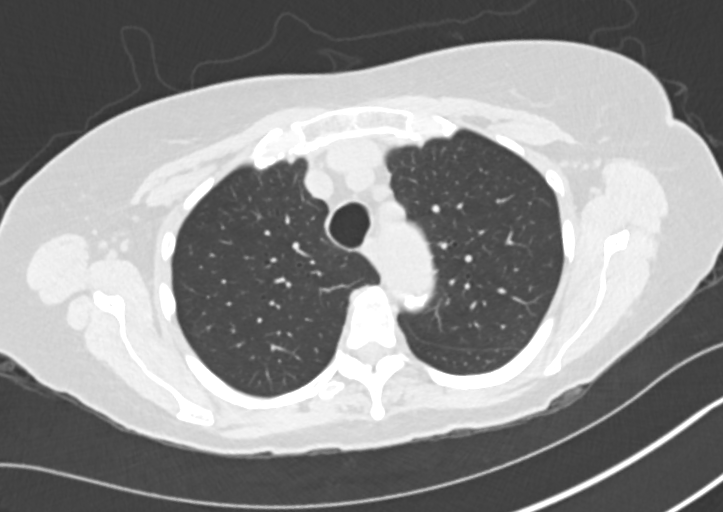
[im 147/160  lung]
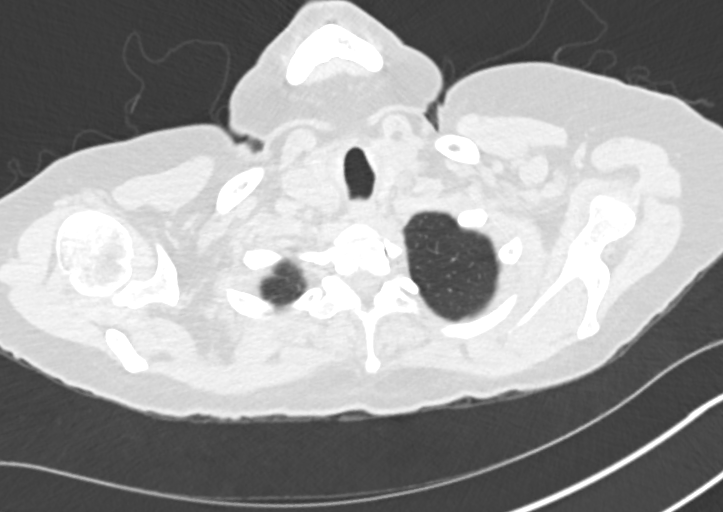

[Series 4: chest 2.00 br40 s3 · coronal · 0.63mm/px · 3 of 134 slices shown (2 of 2)]
[im 27/134  lung]
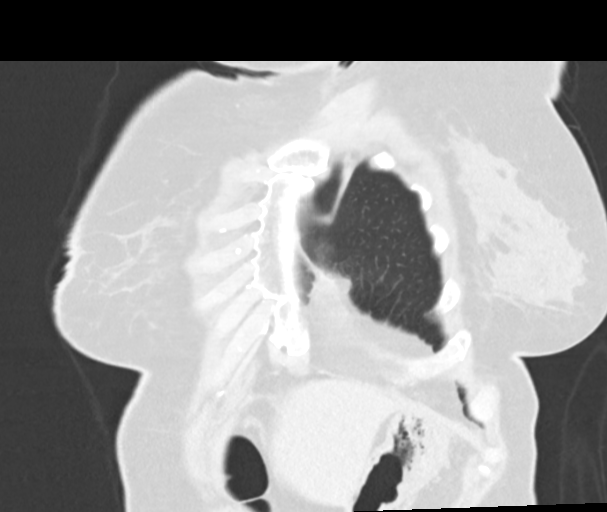
[im 54/134  lung]
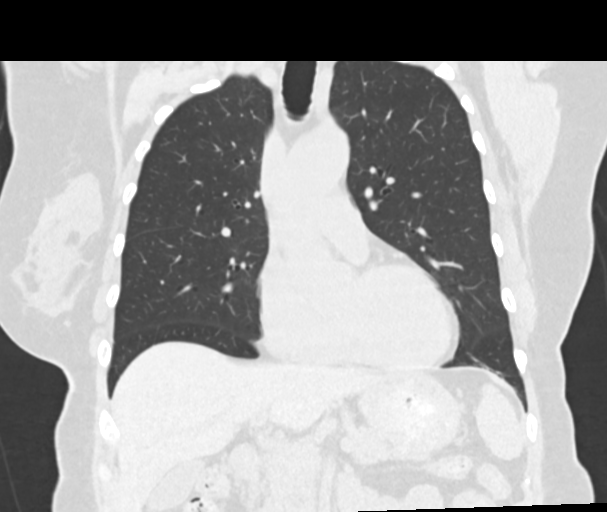
[im 80/134  lung]
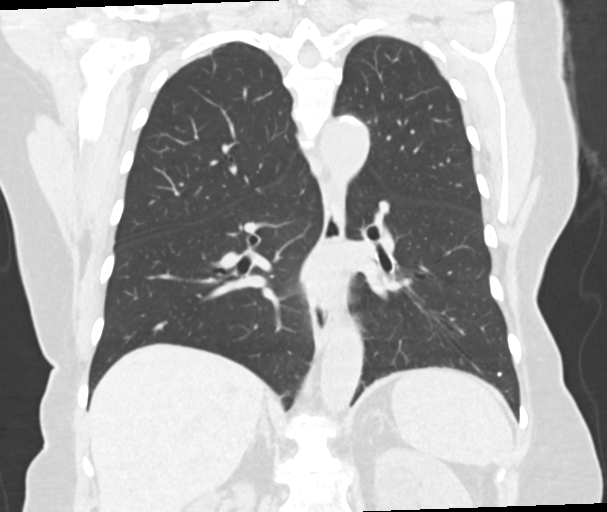

[11 of 36 positions shown; findings below may reference images not displayed]

FINDINGS: Cardiovascular: Mild aortic atherosclerosis. No acute vascular
findings on noncontrast imaging. The heart size is normal. There is
no pericardial effusion.

Mediastinum/Nodes: There are no enlarged mediastinal, hilar or
axillary lymph nodes.Hilar assessment is limited by the lack of
intravenous contrast, although the hilar contours appear unchanged.
The thyroid gland, trachea and esophagus demonstrate no significant
findings.

Lungs/Pleura: No pleural effusion or pneumothorax. Stable 3 mm
ground-glass nodule in the right upper lobe on image [DATE],
consistent with a benign finding. The adjacent nodule seen on the
previous study is no longer present. There are additional scattered
benign-appearing tiny nodules in both lungs which are unchanged. No
new or enlarging pulmonary nodules.

Upper abdomen: No acute findings are seen within the visualized
upper abdomen. There is a cystic lesion in the upper pole of the
left kidney which is grossly stable.

Musculoskeletal/Chest wall: There is no chest wall mass or
suspicious osseous finding.
IMPRESSION: 1. Interval resolution of tiny right upper lobe nodule. Additional
pulmonary nodules are stable from 4775, consistent with benign
findings. Per consensus guidelines, no additional follow-up
necessary. This recommendation follows the consensus statement:
Guidelines for Management of Incidental Pulmonary Nodules Detected
2. No acute chest findings.
3. Mild Aortic Atherosclerosis ([id number]).

## 2022-01-29 NOTE — Telephone Encounter (Signed)
HTA pending faxed notes 

## 2022-01-30 ENCOUNTER — Encounter: Payer: PPO | Admitting: Physical Therapy

## 2022-02-04 ENCOUNTER — Ambulatory Visit: Payer: PPO | Admitting: Physical Therapy

## 2022-02-04 ENCOUNTER — Telehealth: Payer: Self-pay | Admitting: Physical Therapy

## 2022-02-04 NOTE — Telephone Encounter (Signed)
Called patient about her missed appointment today at 9:30. She though her appointment was tomorrow. She will be coming to her next one.  Earlie Counts, PT '@6'$ /07/2022@ 10:10 AM

## 2022-02-05 NOTE — Telephone Encounter (Signed)
HST- HTA auth: [id number] (exp. 01/29/22 to 03/31/22) patient is scheduled at [facility] for 02/19/22 at 11 AM.

## 2022-02-07 NOTE — Telephone Encounter (Signed)
Patient called needing a later pick up time. She is r/s for 03/05/22 at 1:30 pm.

## 2022-02-12 DIAGNOSIS — Z86010 Personal history of colonic polyps: Secondary | ICD-10-CM | POA: Diagnosis not present

## 2022-02-12 DIAGNOSIS — Z1211 Encounter for screening for malignant neoplasm of colon: Secondary | ICD-10-CM | POA: Diagnosis not present

## 2022-02-12 DIAGNOSIS — K5730 Diverticulosis of large intestine without perforation or abscess without bleeding: Secondary | ICD-10-CM | POA: Diagnosis not present

## 2022-02-13 ENCOUNTER — Encounter: Payer: Self-pay | Admitting: Physical Therapy

## 2022-02-13 ENCOUNTER — Ambulatory Visit: Payer: PPO | Admitting: Physical Therapy

## 2022-02-13 DIAGNOSIS — M6281 Muscle weakness (generalized): Secondary | ICD-10-CM

## 2022-02-13 DIAGNOSIS — R3915 Urgency of urination: Secondary | ICD-10-CM

## 2022-02-13 DIAGNOSIS — R252 Cramp and spasm: Secondary | ICD-10-CM

## 2022-02-13 NOTE — Therapy (Signed)
OUTPATIENT PHYSICAL THERAPY TREATMENT NOTE   Patient Name: Kara Mitchell NO MRN: [id number] DOB:[birth year], 71 y.o., female Today's Date: 02/13/2022  PCP: Reynold Bowen, MD REFERRING PROVIDER: Megan Salon MD  END OF SESSION:   PT End of Session - 02/13/22 1617     Visit Number 3    Date for PT Re-Evaluation 04/17/22    Authorization Type [employer]    Authorization - Visit Number 3    Authorization - Number of Visits 10    PT Start Time 7793    PT Stop Time 1655    PT Time Calculation (min) 40 min    Activity Tolerance Patient tolerated treatment well    Behavior During Therapy [facility] for tasks assessed/performed             Past Medical History:  Diagnosis Date   Arthritis    Complication of anesthesia    no fent/versed   Hiatal hernia    Hypothyroidism    Lung nodule    right, lower-seen on CT, had second CT 01/06/14-followed by Dr Roxan Hockey   Plantar fasciitis 2019   Left    PONV (postoperative nausea and vomiting)    Rosacea    Strabismus    Thyroid disease    Urinary incontinence    Past Surgical History:  Procedure Laterality Date   BACK SURGERY  2012   lumb lam   BLEPHAROPLASTY  10/2012   BREAST BIOPSY  1/95   CYSTOCELE REPAIR  2007   DISTAL INTERPHALANGEAL JOINT FUSION Right 12/21/2015   Procedure: DEBRIDEMENT OF RIGHT DISTAL INTERPHALANGEAL JOINT ;  Surgeon: Leanora Cover, MD;  Location: [place];  Service: Orthopedics;  Laterality: Right;   ENTEROCELE REPAIR  2010   ESOPHAGEAL DILATION  9030;0923   KNEE SURGERY     x2-lt   MASS EXCISION Right 12/21/2015   Procedure: RIGHT INDEX EXCISION MASS;  Surgeon: Leanora Cover, MD;  Location: [place];  Service: Orthopedics;  Laterality: Right;   OTHER SURGICAL HISTORY  2001   HNP L4-5, Dr. Arnoldo Morale   TONSILLECTOMY AND ADENOIDECTOMY  1974   TRIGGER FINGER RELEASE Right 02/18/2014   Procedure: RELEASE A-1 PULLEY RIGHT THUMB/POSSIBLE INJ TO LEFT THUMB;  Surgeon: Cammie Sickle, MD;  Location: [place];  Service: Orthopedics;  Laterality: Right;   TRIGGER FINGER RELEASE Left 05/16/2016   Procedure: LEFT THUMB RELEASE TRIGGER FINGER;  Surgeon: Leanora Cover, MD;  Location: [place];  Service: Orthopedics;  Laterality: Left;   VAGINAL HYSTERECTOMY  05/03/2002   VEIN LIGATION AND STRIPPING  2009   Patient Active Problem List   Diagnosis Date Noted   Diverticulosis 10/03/2019   History of adenomatous polyp of colon 10/03/2019   OSA on CPAP 06/06/2017   GERD (gastroesophageal reflux disease) 02/17/2017   Osteoarthritis of finger of right hand 01/29/2016   Incidental lung nodule, less than or equal to 40m 07/12/2014   Blepharoptosis 08/31/2012   REFERRING DIAG: N32.81 (ICD-10-CM) - OAB (overactive bladder)   THERAPY DIAG:  Muscle weakness (generalized)   Cramp and spasm   Urinary urgency   Rationale for Evaluation and Treatment Rehabilitation   PERTINENT HISTORY: Hypothyroidism; Cystocele repair 2007; Enterocele repair 2010; Vaginal Hysterectomy 05/03/2002; Osteopenia   PRECAUTIONS: None   SUBJECTIVE: I only have had the urge one time since last visit.  Walks 3 miles 3x per week.    PATIENT GOALS not have to take medicine for overactive bladder   URINATION  Pain with urination: No Fully empty bladder: No, will void before and after brushing her teeth with urine coming out each time.  Stream: Strong Urgency: Yes: when she gets a real strong urge will leak urine 1x per day Frequency: urinates every 2 hours Leakage: Urge to void and Walking to the bathroom Pads: No  OBJECTIVE: (objective measures completed at initial evaluation unless otherwise dated)   DIAGNOSTIC FINDINGS:  Atrophic changes noted; sling uder urethra very easily palpable       COGNITION:            Overall cognitive status: Within functional limits for tasks assessed                      POSTURE:  Good   LUMBARAROM/PROM   A/PROM  A/PROM  eval  Extension Decreased by 25%  Right lateral flexion Decreased by 25%  Left lateral flexion Decreased by 25%  Right rotation Decreased by 25%  Left rotation Decreased by 25%   (Blank rows = not tested)   LOWER EXTREMITY ROM:   Passive ROM Right eval Left eval  Hip external rotation 40 40   (Blank rows = not tested)   LOWER EXTREMITY MMT:   MMT Right eval Left eval  Hip flexion 4/5 4/5  Hip extension 4/5 3+/5  Hip abduction 3+/5 4/5  Hip adduction 4/5 4/5    PELVIC MMT:   MMT eval 01/28/2022  Vaginal 2/5 except on the lateral sides are 1/5 2/5  (Blank rows = not tested)         PALPATION:   General  left ilium is higher than right. Decreased contraction of the lower abdominal.                  External Perineal Exam tightness on the sides of the bladder                             Internal Pelvic Floor difficulty with relaxation of the pelvic floor muscles, tenderness located in the ilicoccygeus   TONE: increased   PROLAPSE: Rectocele   TODAY'S TREATMENT  02/13/2022 Neuromuscular re-education: Core retraining: transverse abdominus with diaphragmatic breathing 10x    Hip flexion isometric hold 5 sec 5  times each leg with lower abdominal contraction    Hip diagonal holding 5 sec 5x each side     Bridges 10x with ball squeeze    Press hands and back into the mat with ball squeeze hold 5  sec 10 x Self-care: Educated patient on urge to void and she was able to return demonstrate    01/28/2022 Manual: Myofascial release: fascial release of the urogenital diaphragm going through the restrictions.  Internal pelvic floor techniques: Patient confirms identification and approves PT to assess pelvic floor and treatment through the vaginal canal. Manual work on the sides of the introitus and fascial release along the urethra sphincter.  Neuromuscular re-education: Pelvic floor contraction training:tactile cues to the pelvic floor with contraction holding for 5  sec 10 x Self-care: Patient was educated on vaginal moisturizers and where to place them and how they improve vaginal health         PATIENT EDUCATION:  02/13/2022 Education details: Access Code: [email or web address] Person educated: Patient Education method: Explanation, Demonstration, and Handouts Education comprehension: verbalized understanding and returned demonstration     HOME EXERCISE PROGRAM: 02/13/2022 Access Code: [email or web address] URL: [email or web address] Date: 02/13/2022 Prepared by: Earlie Counts  Exercises - Supine Diaphragmatic Breathing  - 3 x daily - 7 x weekly - 1 sets - 10 reps - Hooklying Isometric Hip Flexion  - 1 x daily - 4 x weekly - 1 sets - 10 reps - Hooklying Isometric Hip Flexion with Opposite Arm  - 1 x daily - 4 x weekly - 1 sets - 10 reps - Supine Bridge with Mini Swiss Ball Between Knees  - 1 x daily - 4 x weekly - 1 sets - 10 reps - Supine Transversus Abdominis Bracing - Hands on Ground  - 1 x daily - 3 x weekly - 1 sets - 10 reps - 5 sec hold   ASSESSMENT:   CLINICAL IMPRESSION: Patient is a 71 y.o. female who was seen today for physical therapy evaluation and treatment for overactive bladder. Patient has had 1 time with the strong urge to void. She understands how perform the urge to void. Patient has trouble with holding her breath with her exercises. She is able to contract her abdomen without bulging her lower abdomen.  Patient will benefit from skilled therapy to improve pelvic floor coordination and reduce the urgency to void.      OBJECTIVE IMPAIRMENTS decreased activity tolerance, decreased coordination, decreased endurance, decreased strength, and increased fascial restrictions.    ACTIVITY LIMITATIONS continence and toileting   PARTICIPATION LIMITATIONS:  walking to the bathroom   PERSONAL FACTORS Hypothyroidism; Cystocele repair 2007; Enterocele repair 2010; Vaginal Hysterectomy 05/03/2002; Osteopenia are also affecting patient's functional  outcome.    REHAB POTENTIAL: Excellent   CLINICAL DECISION MAKING: Stable/uncomplicated   EVALUATION COMPLEXITY: Low     GOALS: Goals reviewed with patient? Yes   SHORT TERM GOALS: Target date: 02/20/2022   Patient is educated on vaginal moisturizers to improve vaginal health.  Baseline: Goal status: Met 01/28/2022   2.  Patient educated on diaphragmatic breathing and is able to relax her pelvic floor as she does it. Baseline:  Goal status: INITIAL   3.  Patient is independent with hip stretches to elongate the pelvic floor.  Baseline:  Goal status: INITIAL     LONG TERM GOALS: Target date: 04/17/2022    Patient is independent with advanced HEP for core strength to relax her pelvic floor.  Baseline:  Goal status: INITIAL   2.  Patient urgency to go to the bathroom decreased >/= 80% due to behavioral techniques and improved elongation of tissue.  Baseline:  Goal status: INITIAL   3.  Patient is able to walk to the bathroom when she has the urge to urinate without leaking due to improve pelvic floor strength >/= 3/5 and good hug of the therapist finger.  Baseline:  Goal status: INITIAL   4.  Patient is able to fully relax and contract her pelvic floor so she has improved muscle coordination.  Baseline:  Goal status: INITIAL       PLAN: PT FREQUENCY: 1x/week   PT DURATION: 12 weeks   PLANNED INTERVENTIONS: Therapeutic exercises, Therapeutic activity, Neuromuscular re-education, Patient/Family education, Joint mobilization, Dry Needling, Moist heat, Biofeedback, and Manual therapy   PLAN FOR NEXT SESSION: manual work to pelvic floor,pelvic drop, toilet with pelvic motion, progress HEP    Earlie Counts, PT 02/13/22 5:04 PM

## 2022-02-13 NOTE — Patient Instructions (Addendum)
--  Urge Incontinence  Ideal urination frequency is every 2-4 wakeful hours, which equates to 5-8 times within a 24-hour period.   Urge incontinence is leakage that occurs when the bladder muscle contracts, creating a sudden need to go before getting to the bathroom.   Going too often when your bladder isn't actually full can disrupt the body's automatic signals to store and hold urine longer, which will increase urgency/frequency.  In this case, the bladder "is running the show" and strategies can be learned to retrain this pattern.   One should be able to control the first urge to urinate, at around 19m.  The bladder can hold up to a "grande latte," or 4065m To help you gain control, practice the Urge Drill below when urgency strikes.  This drill will help retrain your bladder signals and allow you to store and hold urine longer.  The overall goal is to stretch out your time between voids to reach a more manageable voiding schedule.    Practice your "quick flicks" often throughout the day (each waking hour) even when you don't need feel the urge to go.  This will help strengthen your pelvic floor muscles, making them more effective in controlling leakage.  Urge Drill  When you feel an urge to go, follow these steps to regain control: Stop what you are doing and be still Take one deep breath, directing your air into your abdomen Think an affirming thought, such as "I've got this." Do 5 quick flicks of your pelvic floor Walk with control to the bathroom to void, or delay voiding  [facility] [address]hone # [phone]

## 2022-02-20 ENCOUNTER — Ambulatory Visit: Payer: PPO | Admitting: Physical Therapy

## 2022-02-20 ENCOUNTER — Encounter: Payer: Self-pay | Admitting: Physical Therapy

## 2022-02-20 DIAGNOSIS — R252 Cramp and spasm: Secondary | ICD-10-CM

## 2022-02-20 DIAGNOSIS — M6281 Muscle weakness (generalized): Secondary | ICD-10-CM | POA: Diagnosis not present

## 2022-02-20 DIAGNOSIS — R3915 Urgency of urination: Secondary | ICD-10-CM

## 2022-02-20 NOTE — Therapy (Signed)
OUTPATIENT PHYSICAL THERAPY TREATMENT NOTE   Patient Name: Kara Mitchell MRN: [id number] DOB:September 06, 1950, 71 y.o., female Today's Date: 02/20/2022  PCP: Reynold Bowen, MD REFERRING PROVIDER: Megan Salon MD  END OF SESSION:   PT End of Session - 02/20/22 1019     Visit Number 4    Date for PT Re-Evaluation 04/17/22    Authorization Type [employer]    Authorization - Visit Number 4    Authorization - Number of Visits 10    PT Start Time 1015    PT Stop Time 1055    PT Time Calculation (min) 40 min    Activity Tolerance Patient tolerated treatment well    Behavior During Therapy WFL for tasks assessed/performed             Past Medical History:  Diagnosis Date   Arthritis    Complication of anesthesia    no fent/versed   Hiatal hernia    Hypothyroidism    Lung nodule    right, lower-seen on CT, had second CT 01/06/14-followed by Dr Roxan Hockey   Plantar fasciitis 2019   Left    PONV (postoperative nausea and vomiting)    Rosacea    Strabismus    Thyroid disease    Urinary incontinence    Past Surgical History:  Procedure Laterality Date   BACK SURGERY  2012   lumb lam   BLEPHAROPLASTY  10/2012   BREAST BIOPSY  1/95   CYSTOCELE REPAIR  2007   DISTAL INTERPHALANGEAL JOINT FUSION Right 12/21/2015   Procedure: DEBRIDEMENT OF RIGHT DISTAL INTERPHALANGEAL JOINT ;  Surgeon: Leanora Cover, MD;  Location: [place];  Service: Orthopedics;  Laterality: Right;   ENTEROCELE REPAIR  2010   ESOPHAGEAL DILATION  5597;4163   KNEE SURGERY     x2-lt   MASS EXCISION Right 12/21/2015   Procedure: RIGHT INDEX EXCISION MASS;  Surgeon: Leanora Cover, MD;  Location: [place];  Service: Orthopedics;  Laterality: Right;   OTHER SURGICAL HISTORY  2001   HNP L4-5, Dr. Arnoldo Morale   TONSILLECTOMY AND ADENOIDECTOMY  1974   TRIGGER FINGER RELEASE Right 02/18/2014   Procedure: RELEASE A-1 PULLEY RIGHT THUMB/POSSIBLE INJ TO LEFT THUMB;  Surgeon: Cammie Sickle, MD;  Location: [place];  Service: Orthopedics;  Laterality: Right;   TRIGGER FINGER RELEASE Left 05/16/2016   Procedure: LEFT THUMB RELEASE TRIGGER FINGER;  Surgeon: Leanora Cover, MD;  Location: [place];  Service: Orthopedics;  Laterality: Left;   VAGINAL HYSTERECTOMY  05/03/2002   VEIN LIGATION AND STRIPPING  2009   Patient Active Problem List   Diagnosis Date Noted   Diverticulosis 10/03/2019   History of adenomatous polyp of colon 10/03/2019   OSA on CPAP 06/06/2017   GERD (gastroesophageal reflux disease) 02/17/2017   Osteoarthritis of finger of right hand 01/29/2016   Incidental lung nodule, less than or equal to 60m 07/12/2014   Blepharoptosis 08/31/2012   REFERRING DIAG: N32.81 (ICD-10-CM) - OAB (overactive bladder)   THERAPY DIAG:  Muscle weakness (generalized)   Cramp and spasm   Urinary urgency   Rationale for Evaluation and Treatment Rehabilitation   PERTINENT HISTORY: Hypothyroidism; Cystocele repair 2007; Enterocele repair 2010; Vaginal Hysterectomy 05/03/2002; Osteopenia   PRECAUTIONS: None   SUBJECTIVE: My abdominal muscles are still sore. Bladder is doing good.  The urgency is reduced.  PATIENT GOALS not have to take medicine for overactive bladder   URINATION Pain with urination: No Fully  empty bladder: No, will void before and after brushing her teeth with urine coming out each time.  Stream: Strong Urgency: Yes: when she gets a real strong urge will leak urine 1x per day Frequency: urinates every 2 hours Leakage: Urge to void and Walking to the bathroom Pads: No   OBJECTIVE: (objective measures completed at initial evaluation unless otherwise dated)   DIAGNOSTIC FINDINGS:  Atrophic changes noted; sling uder urethra very easily palpable       COGNITION:            Overall cognitive status: Within functional limits for tasks assessed                  POSTURE:  Good   LUMBARAROM/PROM   A/PROM  A/PROM  eval  Extension Decreased by 25%  Right lateral flexion Decreased by 25%  Left lateral flexion Decreased by 25%  Right rotation Decreased by 25%  Left rotation Decreased by 25%   (Blank rows = not tested)   LOWER EXTREMITY ROM:   Passive ROM Right eval Left eval  Hip external rotation 40 40   (Blank rows = not tested)   LOWER EXTREMITY MMT:   MMT Right eval Left eval  Hip flexion 4/5 4/5  Hip extension 4/5 3+/5  Hip abduction 3+/5 4/5  Hip adduction 4/5 4/5    PELVIC MMT:   MMT eval 01/28/2022  Vaginal 2/5 except on the lateral sides are 1/5 2/5  (Blank rows = not tested)         PALPATION:   General  left ilium is higher than right. Decreased contraction of the lower abdominal.                  External Perineal Exam tightness on the sides of the bladder                             Internal Pelvic Floor difficulty with relaxation of the pelvic floor muscles, tenderness located in the ilicoccygeus   TONE: increased   PROLAPSE: Rectocele   TODAY'S TREATMENT   02/20/2022  Neuromuscular re-education: Core retraining:Hip flexion isometric hold 5 sec 5  times each leg with lower abdominal contraction                                     Hip diagonal holding 5 sec 5x each side                                      Bridges 10x with ball squeeze                                     Press hands and back into the mat with ball squeeze hold 5  sec 10 x Supine knees out with pelvic floor contraction and abdominal contraction Pelvic floor contraction training:diaphragmatic breathing in supine with 15x      Self-care: Discussed with patient on how to go through a different door to reduce the urgency.     02/13/2022 Neuromuscular re-education: Core retraining: transverse abdominus with diaphragmatic breathing 10x  Hip flexion isometric hold 5 sec 5  times each leg with lower abdominal contraction                                      Hip diagonal holding 5 sec 5x each side                                      Bridges 10x with ball squeeze                                     Press hands and back into the mat with ball squeeze hold 5  sec 10 x    Self-care: Educated patient on urge to void and she was able to return demonstrate     01/28/2022 Manual: Myofascial release: fascial release of the urogenital diaphragm going through the restrictions.  Internal pelvic floor techniques: Patient confirms identification and approves PT to assess pelvic floor and treatment through the vaginal canal. Manual work on the sides of the introitus and fascial release along the urethra sphincter.  Neuromuscular re-education: Pelvic floor contraction training:tactile cues to the pelvic floor with contraction holding for 5 sec 10 x Self-care: Patient was educated on vaginal moisturizers and where to place them and how they improve vaginal health         PATIENT EDUCATION:  02/20/2022 Education details: Access Code: [email or web address] Person educated: Patient Education method: Explanation, Demonstration, and Handouts Education comprehension: verbalized understanding and returned demonstration     HOME EXERCISE PROGRAM: 02/20/2022  Access Code: [email or web address] URL: [email or web address] Date: 02/20/2022 Prepared by: Earlie Counts  Program Notes use a brand new roll of paper towel for exercises.   Exercises - Supine Diaphragmatic Breathing  - 3 x daily - 7 x weekly - 1 sets - 10 reps - Hooklying Isometric Hip Flexion  - 1 x daily - 4 x weekly - 1 sets - 10 reps - Hooklying Isometric Hip Flexion with Opposite Arm  - 1 x daily - 4 x weekly - 1 sets - 10 reps - Supine Bridge with Mini Swiss Ball Between Knees  - 1 x daily - 4 x weekly - 1 sets - 10 reps - Supine Transversus Abdominis Bracing with Double Leg Fallout  - 1 x daily - 4 x weekly - 1 sets - 10 reps ASSESSMENT:   CLINICAL IMPRESSION: Patient is a 71 y.o. female who was seen  today for physical therapy evaluation and treatment for overactive bladder. Patient reports her urgency is 95% better. She is able to do her HEP with exception of one exercise and it was changed. Patient feels her bladder is doing better and she is using the urge to void technique.  she is able to do diaphragmatic breathing to relax her pelvic floor. Patient will benefit from skilled therapy to improve pelvic floor coordination and reduce the urgency to void.      OBJECTIVE IMPAIRMENTS decreased activity tolerance, decreased coordination, decreased endurance, decreased strength, and increased fascial restrictions.    ACTIVITY LIMITATIONS continence and toileting   PARTICIPATION LIMITATIONS:  walking to the bathroom   PERSONAL FACTORS Hypothyroidism; Cystocele repair 2007; Enterocele repair 2010; Vaginal Hysterectomy 05/03/2002; Osteopenia are also affecting patient's functional outcome.    REHAB POTENTIAL: Excellent  CLINICAL DECISION MAKING: Stable/uncomplicated   EVALUATION COMPLEXITY: Low     GOALS: Goals reviewed with patient? Yes   SHORT TERM GOALS: Target date: 02/20/2022   Patient is educated on vaginal moisturizers to improve vaginal health.  Baseline: Goal status: Met 01/28/2022   2.  Patient educated on diaphragmatic breathing and is able to relax her pelvic floor as she does it. Baseline:  Goal status: Met 02/20/2022   3.  Patient is independent with hip stretches to elongate the pelvic floor.  Baseline:  Goal status: INITIAL     LONG TERM GOALS: Target date: 04/17/2022    Patient is independent with advanced HEP for core strength to relax her pelvic floor.  Baseline:  Goal status: INITIAL   2.  Patient urgency to go to the bathroom decreased >/= 80% due to behavioral techniques and improved elongation of tissue.  Baseline:  Goal status: INITIAL   3.  Patient is able to walk to the bathroom when she has the urge to urinate without leaking due to improve pelvic floor  strength >/= 3/5 and good hug of the therapist finger.  Baseline:  Goal status: INITIAL   4.  Patient is able to fully relax and contract her pelvic floor so she has improved muscle coordination.  Baseline:  Goal status: INITIAL       PLAN: PT FREQUENCY: 1x/week   PT DURATION: 12 weeks   PLANNED INTERVENTIONS: Therapeutic exercises, Therapeutic activity, Neuromuscular re-education, Patient/Family education, Joint mobilization, Dry Needling, Moist heat, Biofeedback, and Manual therapy   PLAN FOR NEXT SESSION: review HEP, check over goals    Earlie Counts, PT 02/20/22 11:01 AM

## 2022-02-27 ENCOUNTER — Telehealth (HOSPITAL_BASED_OUTPATIENT_CLINIC_OR_DEPARTMENT_OTHER): Payer: Self-pay | Admitting: Obstetrics & Gynecology

## 2022-02-27 ENCOUNTER — Ambulatory Visit: Payer: PPO | Attending: Obstetrics & Gynecology | Admitting: Physical Therapy

## 2022-02-27 ENCOUNTER — Encounter: Payer: Self-pay | Admitting: Physical Therapy

## 2022-02-27 ENCOUNTER — Other Ambulatory Visit (HOSPITAL_BASED_OUTPATIENT_CLINIC_OR_DEPARTMENT_OTHER): Payer: Self-pay | Admitting: Obstetrics & Gynecology

## 2022-02-27 DIAGNOSIS — M6281 Muscle weakness (generalized): Secondary | ICD-10-CM | POA: Insufficient documentation

## 2022-02-27 DIAGNOSIS — R3915 Urgency of urination: Secondary | ICD-10-CM | POA: Insufficient documentation

## 2022-02-27 DIAGNOSIS — R252 Cramp and spasm: Secondary | ICD-10-CM | POA: Diagnosis not present

## 2022-02-27 MED ORDER — OXYBUTYNIN CHLORIDE ER 5 MG PO TB24: 5.0000 mg | ORAL_TABLET | Freq: Every day | ORAL | 1 refills | Status: DC

## 2022-02-27 NOTE — Telephone Encounter (Signed)
Called pt after receiving a message from PT [employer].  Pt would like to wean off oxybutynin to see if techniques she's learned will help without medication.  Would recommend decreasing to '5mg'$  XL dosage first.  She is ok trying this.  New rx will need to be sent to [facility].  Pt aware I will send this.  If symptoms worsen on lower dosage, she knows to just go back to the '10mg'$  XL dosage and continue with PT.  Questions answered.
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
## 2022-03-06 ENCOUNTER — Ambulatory Visit: Payer: PPO | Admitting: Physical Therapy

## 2022-03-07 DIAGNOSIS — G4733 Obstructive sleep apnea (adult) (pediatric): Secondary | ICD-10-CM | POA: Diagnosis not present

## 2022-03-13 ENCOUNTER — Ambulatory Visit: Payer: PPO | Admitting: Physical Therapy

## 2022-03-29 ENCOUNTER — Ambulatory Visit: Payer: PPO | Admitting: Physical Therapy

## 2022-04-03 ENCOUNTER — Ambulatory Visit: Payer: PPO | Admitting: Physical Therapy

## 2022-04-09 DIAGNOSIS — M542 Cervicalgia: Secondary | ICD-10-CM | POA: Diagnosis not present

## 2022-04-22 ENCOUNTER — Other Ambulatory Visit (HOSPITAL_BASED_OUTPATIENT_CLINIC_OR_DEPARTMENT_OTHER): Payer: Self-pay | Admitting: Obstetrics & Gynecology

## 2022-04-30 DIAGNOSIS — E785 Hyperlipidemia, unspecified: Secondary | ICD-10-CM | POA: Diagnosis not present

## 2022-04-30 DIAGNOSIS — G4733 Obstructive sleep apnea (adult) (pediatric): Secondary | ICD-10-CM | POA: Diagnosis not present

## 2022-04-30 DIAGNOSIS — H43811 Vitreous degeneration, right eye: Secondary | ICD-10-CM | POA: Diagnosis not present

## 2022-04-30 DIAGNOSIS — E559 Vitamin D deficiency, unspecified: Secondary | ICD-10-CM | POA: Diagnosis not present

## 2022-04-30 DIAGNOSIS — K635 Polyp of colon: Secondary | ICD-10-CM | POA: Diagnosis not present

## 2022-04-30 DIAGNOSIS — K219 Gastro-esophageal reflux disease without esophagitis: Secondary | ICD-10-CM | POA: Diagnosis not present

## 2022-04-30 DIAGNOSIS — G454 Transient global amnesia: Secondary | ICD-10-CM | POA: Diagnosis not present

## 2022-04-30 DIAGNOSIS — R911 Solitary pulmonary nodule: Secondary | ICD-10-CM | POA: Diagnosis not present

## 2022-04-30 DIAGNOSIS — M8580 Other specified disorders of bone density and structure, unspecified site: Secondary | ICD-10-CM | POA: Diagnosis not present

## 2022-04-30 DIAGNOSIS — E039 Hypothyroidism, unspecified: Secondary | ICD-10-CM | POA: Diagnosis not present

## 2022-04-30 DIAGNOSIS — I700 Atherosclerosis of aorta: Secondary | ICD-10-CM | POA: Diagnosis not present

## 2022-06-20 ENCOUNTER — Other Ambulatory Visit (HOSPITAL_BASED_OUTPATIENT_CLINIC_OR_DEPARTMENT_OTHER): Payer: Self-pay | Admitting: Obstetrics & Gynecology

## 2022-07-30 DIAGNOSIS — H43813 Vitreous degeneration, bilateral: Secondary | ICD-10-CM | POA: Diagnosis not present

## 2022-07-30 DIAGNOSIS — H35371 Puckering of macula, right eye: Secondary | ICD-10-CM | POA: Diagnosis not present

## 2022-07-30 DIAGNOSIS — Z961 Presence of intraocular lens: Secondary | ICD-10-CM | POA: Diagnosis not present

## 2022-07-30 DIAGNOSIS — H3581 Retinal edema: Secondary | ICD-10-CM | POA: Diagnosis not present

## 2022-07-30 DIAGNOSIS — H26492 Other secondary cataract, left eye: Secondary | ICD-10-CM | POA: Diagnosis not present

## 2022-08-01 DIAGNOSIS — Z1231 Encounter for screening mammogram for malignant neoplasm of breast: Secondary | ICD-10-CM | POA: Diagnosis not present

## 2022-08-14 ENCOUNTER — Encounter (HOSPITAL_BASED_OUTPATIENT_CLINIC_OR_DEPARTMENT_OTHER): Payer: Self-pay | Admitting: *Deleted

## 2022-09-03 ENCOUNTER — Telehealth: Payer: Self-pay | Admitting: Family Medicine

## 2022-09-03 NOTE — Telephone Encounter (Signed)
Patient called wanting to r/s her SS from 03/05/22.  She has been r/s for 09/24/22 at 9 AM.  Mailed packet to the patient.  HST- [employer] no auth req via auto machine ref # [id number].

## 2022-09-11 DIAGNOSIS — H43813 Vitreous degeneration, bilateral: Secondary | ICD-10-CM | POA: Diagnosis not present

## 2022-09-11 DIAGNOSIS — H26492 Other secondary cataract, left eye: Secondary | ICD-10-CM | POA: Diagnosis not present

## 2022-09-11 DIAGNOSIS — H35371 Puckering of macula, right eye: Secondary | ICD-10-CM | POA: Diagnosis not present

## 2022-09-11 DIAGNOSIS — H3581 Retinal edema: Secondary | ICD-10-CM | POA: Diagnosis not present

## 2022-09-11 DIAGNOSIS — Z961 Presence of intraocular lens: Secondary | ICD-10-CM | POA: Diagnosis not present

## 2022-09-20 DIAGNOSIS — Z0100 Encounter for examination of eyes and vision without abnormal findings: Secondary | ICD-10-CM | POA: Diagnosis not present

## 2022-09-24 ENCOUNTER — Ambulatory Visit (INDEPENDENT_AMBULATORY_CARE_PROVIDER_SITE_OTHER): Payer: Medicare HMO | Admitting: Neurology

## 2022-09-24 DIAGNOSIS — G4733 Obstructive sleep apnea (adult) (pediatric): Secondary | ICD-10-CM

## 2022-09-24 DIAGNOSIS — Z789 Other specified health status: Secondary | ICD-10-CM

## 2022-09-26 NOTE — Procedures (Signed)
[facility]  HOME SLEEP TEST (Watch PAT) REPORT  STUDY DATE: 09/24/2022  DOB: 27-Mar-1951  MRN: [id number]  ORDERING CLINICIAN: Star Age, MD, PhD   REFERRING CLINICIAN: Reynold Bowen, MD (PCP), Debbora Presto, NP (sleep)  CLINICAL INFORMATION/HISTORY: 72 year old female with an underlying medical history of obstructive sleep apnea, hypothyroidism, diverticulosis, reflux disease, and borderline overweight state, who presents for reevaluation of her obstructive sleep apnea.  She has been on AutoPap therapy with compliance.  She has achieved weight loss.   Epworth sleepiness score: 4/24.  FINDINGS:   Sleep Summary:   Total Recording Time (hours, min): 7 hours, 50 min  Total Sleep Time (hours, min):  6 hours, 38 min  Percent REM (%):    24.1%   Respiratory Indices:   Calculated pAHI (per hour):  21.8/hour         REM pAHI:    35.2/hour       NREM pAHI: 17.5/hour  Central pAHI: 1.7/hour  Oxygen Saturation Statistics:    Oxygen Saturation (%) Mean: 94%   Minimum oxygen saturation (%):                 83%   O2 Saturation Range (%): 83-99%    O2 Saturation (minutes) <=88%: 5.3 min  Pulse Rate Statistics:   Pulse Mean (bpm):    62/min    Pulse Range (47 - 101/min)   IMPRESSION: OSA (obstructive sleep apnea)  RECOMMENDATION:  This home sleep test demonstrates evidence of obstructive sleep apnea with a total AHI of 21.8/hour. Utilizing the 4% desaturation criteria for hypopneas leading her AHI is 12.7/h. O2 nadir was 83%.  Intermittent mild to moderate snoring was detected.  Ongoing treatment with a positive airway pressure (PAP) device is recommended. The patient is established on AutoPap therapy.  If she is eligible for a new machine, a new machine can be prescribed with the current settings maintained as she had good apnea control thus far.  Based on Medicare guidelines with a total AHI of 12.7/h, utilizing the 4% desaturation rule for hypopneas,  she qualifies for the diagnosis of mild overall sleep apnea.  Of note, mild sleep apnea patients are not considered candidates for the inspire device.  Alternative treatment options may include a dental device through dentistry or orthodontics in selected patients. Concomitant weight loss is recommended (where clinically appropriate). Please note that untreated obstructive sleep apnea may carry additional perioperative morbidity. Patients with significant obstructive sleep apnea should receive perioperative PAP therapy and the surgeons and particularly the anesthesiologist should be informed of the diagnosis and the severity of the sleep disordered breathing. The patient should be cautioned not to drive, work at heights, or operate dangerous or heavy equipment when tired or sleepy. Review and reiteration of good sleep hygiene measures should be pursued with any patient. Other causes of the patient's symptoms, including circadian rhythm disturbances, an underlying mood disorder, medication effect and/or an underlying medical problem cannot be ruled out based on this test. Clinical correlation is recommended.  The patient and her referring provider will be notified of the test results. The patient will be seen in follow up in sleep clinic at [facility].  I certify that I have reviewed the raw data recording prior to the issuance of this report in accordance with the standards of the American Academy of Sleep Medicine (AASM).   INTERPRETING PHYSICIAN:   Star Age, MD, PhD Medical Director, [facility]) Rudy, ABPN (Neurology and Sleep)  [facility] [address] [place], [place] [address] [phone]

## 2022-09-26 NOTE — Progress Notes (Signed)
See procedure note.

## 2022-10-08 DIAGNOSIS — M542 Cervicalgia: Secondary | ICD-10-CM | POA: Diagnosis not present

## 2022-10-14 ENCOUNTER — Telehealth: Payer: Self-pay | Admitting: Family Medicine

## 2022-10-14 NOTE — Telephone Encounter (Signed)
Pt would like a call from the nurse need new setting for new CPAPmachine off new sleep study test.  Pt is at [facility] trying to get a new CPAP machine. Want to make sure did not have any CPAP changing in pt setting.  Respiratory therapist, Natale Milch said, if there were any new setting can fax over new setting.
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
## 2022-10-14 NOTE — Telephone Encounter (Signed)
Amy, I see you sent mychart to pt on 09/30/22:  "Good morning, Mrs Crisan. Dr Rexene Alberts was able to read your latest sleep evaluation and it does show continued concerns of sleep aonea. She recommends that you continue CPAP therapy. I know you were having some trouble tolerating the headgear/tubing. I would be happy to send orders to the DME company to see if there are any other options that may be better tolerated.You should be able to contact them anytime to discuss. You are scheduled to see me 11/07/2022. We can discuss further at that time. Call sooner if needed. "  Appears you were not changing any settings correct?

## 2022-10-15 DIAGNOSIS — G4733 Obstructive sleep apnea (adult) (pediatric): Secondary | ICD-10-CM | POA: Diagnosis not present

## 2022-10-24 DIAGNOSIS — D1801 Hemangioma of skin and subcutaneous tissue: Secondary | ICD-10-CM | POA: Diagnosis not present

## 2022-10-24 DIAGNOSIS — L821 Other seborrheic keratosis: Secondary | ICD-10-CM | POA: Diagnosis not present

## 2022-10-24 DIAGNOSIS — L814 Other melanin hyperpigmentation: Secondary | ICD-10-CM | POA: Diagnosis not present

## 2022-10-24 DIAGNOSIS — D225 Melanocytic nevi of trunk: Secondary | ICD-10-CM | POA: Diagnosis not present

## 2022-10-30 DIAGNOSIS — E039 Hypothyroidism, unspecified: Secondary | ICD-10-CM | POA: Diagnosis not present

## 2022-10-30 DIAGNOSIS — E559 Vitamin D deficiency, unspecified: Secondary | ICD-10-CM | POA: Diagnosis not present

## 2022-10-30 DIAGNOSIS — E785 Hyperlipidemia, unspecified: Secondary | ICD-10-CM | POA: Diagnosis not present

## 2022-10-30 DIAGNOSIS — K219 Gastro-esophageal reflux disease without esophagitis: Secondary | ICD-10-CM | POA: Diagnosis not present

## 2022-11-05 DIAGNOSIS — Z1339 Encounter for screening examination for other mental health and behavioral disorders: Secondary | ICD-10-CM | POA: Diagnosis not present

## 2022-11-05 DIAGNOSIS — H43811 Vitreous degeneration, right eye: Secondary | ICD-10-CM | POA: Diagnosis not present

## 2022-11-05 DIAGNOSIS — G4733 Obstructive sleep apnea (adult) (pediatric): Secondary | ICD-10-CM | POA: Diagnosis not present

## 2022-11-05 DIAGNOSIS — E039 Hypothyroidism, unspecified: Secondary | ICD-10-CM | POA: Diagnosis not present

## 2022-11-05 DIAGNOSIS — I700 Atherosclerosis of aorta: Secondary | ICD-10-CM | POA: Diagnosis not present

## 2022-11-05 DIAGNOSIS — M8580 Other specified disorders of bone density and structure, unspecified site: Secondary | ICD-10-CM | POA: Diagnosis not present

## 2022-11-05 DIAGNOSIS — R911 Solitary pulmonary nodule: Secondary | ICD-10-CM | POA: Diagnosis not present

## 2022-11-05 DIAGNOSIS — E785 Hyperlipidemia, unspecified: Secondary | ICD-10-CM | POA: Diagnosis not present

## 2022-11-05 DIAGNOSIS — K635 Polyp of colon: Secondary | ICD-10-CM | POA: Diagnosis not present

## 2022-11-05 DIAGNOSIS — Z1331 Encounter for screening for depression: Secondary | ICD-10-CM | POA: Diagnosis not present

## 2022-11-05 DIAGNOSIS — Z0000 Encounter for general adult medical examination without abnormal findings: Secondary | ICD-10-CM | POA: Diagnosis not present

## 2022-11-07 ENCOUNTER — Ambulatory Visit: Payer: PPO | Admitting: Family Medicine

## 2022-12-04 ENCOUNTER — Other Ambulatory Visit: Payer: Self-pay | Admitting: Thoracic Surgery (Cardiothoracic Vascular Surgery)

## 2022-12-04 DIAGNOSIS — R911 Solitary pulmonary nodule: Secondary | ICD-10-CM

## 2022-12-15 ENCOUNTER — Other Ambulatory Visit (HOSPITAL_BASED_OUTPATIENT_CLINIC_OR_DEPARTMENT_OTHER): Payer: Self-pay | Admitting: Obstetrics & Gynecology

## 2022-12-30 NOTE — Progress Notes (Deleted)
PATIENT: Kara Mitchell DOB: 03/22/1951  REASON FOR VISIT: follow up HISTORY FROM: patient  No chief complaint on file.    HISTORY OF PRESENT ILLNESS:  12/30/22 ALL:  Kara Mitchell returns for follow up for OSA on CPAP.   11/01/2021 ALL: Kara Mitchell is a 72 y.o. female here today for follow up for OSA on CPAP. Her download below demonstrates acceptable compliance for days worn and for > 4 hours. Her AHI is 2.2 on autotitration 5-11 cmH2O. She reports that she doesn't love the machine and has expressed interest in stopping CPAP or trying the [place] device. She has not noted any significant benefit with using CPAP. She feels that she fights with the machine more than she rests with it. Her HST revealed that she had mild sleep apnea with an AHI of 14.6 and O2 nadir of 86%. She has a hx of transient global amnesia. Workup has been unremarkable and she feels event was triggered by stress. She has recently lost some weight and is interested in retesting at this time.     HISTORY: (copied from Dr Teofilo Pod previous note)  Kara Mitchell is a 72 year old right-handed woman with an underlying medical history of arthritis, mild sleep apnea, hypothyroidism, lung nodule, plantar fasciitis, diverticulosis, reflux disease, prior smoking, mildly overweight state and history of rosacea, who presents for follow-up consultation of her mild sleep apnea.  She was last seen on 05/31/2020, at which time she reported feeling stable, no recent change in her symptoms or new symptoms from the neurological standpoint, no further episodes similar to TGA.  She had not quite started her AutoPap treatment.   Today, 05/01/2021: I reviewed her AutoPap compliance data from 04/01/2021 through 04/30/2021, which is a total of 30 days, during which time she used her machine 27 days with percent use days greater than 4 hours at 87%, indicating very good compliance with an average usage for days on treatment of 6 hours and 57 minutes,  residual AHI at goal at 3.4/h, 95th percentile of pressure at 9.5 cm with a range of 5 to 11 cm with EPR, leak on the higher side with a 95th percentile at 32 L/min.  Leak has increased in the recent past but has been fluctuating.  Her set up date was 02/02/2021 but she needed a different machine as the first machine was not working properly.  She started using her machine on 02/23/2021 and with new equipment.  Reports struggling with a fullface mask, she started off with a F 20 fullface mask but is now using a hybrid style from [employer].  She is still struggling with air leakage and tolerance of the pressure.  She is motivated to continue with treatment.  She reports interim difficulty with her Crestor.  She started feeling joint pain and muscle aches and eventually stopped the Crestor about 2 weeks ago after discussing with her primary care physician.  She has a follow-up appointment today.  She did bring her blood work from the recent past.  I reviewed her blood test results.  On 03/01/2020 her LDL was 122.  In July 2022 her LDL was 97.  She has had an increase in her triglycerides.  She is trying to eat well and hydrate well but does report that she could do better with her eating habits.  She has been given some dietary advice from the primary care physician's office as I understand but would like to discuss more in depth today at her appointment  this afternoon.    REVIEW OF SYSTEMS: Out of a complete 14 system review of symptoms, the patient complains only of the following symptoms, none and all other reviewed systems are negative.  ESS: 4  ALLERGIES: Allergies  Allergen Reactions   Fentanyl Other (See Comments)    Low blood pressure   Meloxicam Diarrhea   Neosporin [Neomycin-Bacitracin Zn-Polymyx] Other (See Comments)    whelps   Polysporin [Bacitracin-Polymyxin B] Other (See Comments)    whelps   Tetanus Toxoids Other (See Comments)    fainting   Versed [Midazolam] Other (See Comments)     Low blood pressure    HOME MEDICATIONS: Outpatient Medications Prior to Visit  Medication Sig Dispense Refill   diphenhydrAMINE-APAP, sleep, (TYLENOL PM EXTRA STRENGTH PO) Take by mouth. 1 every night     ibuprofen (ADVIL,MOTRIN) 800 MG tablet Take 1 tablet (800 mg total) by mouth every 8 (eight) hours as needed. 40 tablet 0   oxybutynin (DITROPAN-XL) 5 MG 24 hr tablet TAKE ONE TABLET BY MOUTH DAILY 30 tablet 0   pantoprazole (PROTONIX) 40 MG tablet Take 40 mg by mouth daily.     thyroid (ARMOUR) 30 MG tablet Take 30 mg by mouth daily.     vitamin B-12 (CYANOCOBALAMIN) 250 MCG tablet Take 250 mcg by mouth daily.     No facility-administered medications prior to visit.    PAST MEDICAL HISTORY: Past Medical History:  Diagnosis Date   Arthritis    Complication of anesthesia    no fent/versed   Hiatal hernia    Hypothyroidism    Lung nodule    right, lower-seen on CT, had second CT 01/06/14-followed by Dr Dorris Fetch   Plantar fasciitis 2019   Left    PONV (postoperative nausea and vomiting)    Rosacea    Strabismus    Thyroid disease    Urinary incontinence     PAST SURGICAL HISTORY: Past Surgical History:  Procedure Laterality Date   BACK SURGERY  2012   lumb lam   BLEPHAROPLASTY  10/2012   BREAST BIOPSY  1/95   CYSTOCELE REPAIR  2007   DISTAL INTERPHALANGEAL JOINT FUSION Right 12/21/2015   Procedure: DEBRIDEMENT OF RIGHT DISTAL INTERPHALANGEAL JOINT ;  Surgeon: Betha Loa, MD;  Location: [facility];  Service: Orthopedics;  Laterality: Right;   ENTEROCELE REPAIR  2010   ESOPHAGEAL DILATION  6578;4696   KNEE SURGERY     x2-lt   MASS EXCISION Right 12/21/2015   Procedure: RIGHT INDEX EXCISION MASS;  Surgeon: Betha Loa, MD;  Location: [facility];  Service: Orthopedics;  Laterality: Right;   OTHER SURGICAL HISTORY  2001   HNP L4-5, Dr. Lovell Sheehan   TONSILLECTOMY AND ADENOIDECTOMY  1974   TRIGGER FINGER RELEASE Right 02/18/2014   Procedure:  RELEASE A-1 PULLEY RIGHT THUMB/POSSIBLE INJ TO LEFT THUMB;  Surgeon: Wyn Forster, MD;  Location: [facility];  Service: Orthopedics;  Laterality: Right;   TRIGGER FINGER RELEASE Left 05/16/2016   Procedure: LEFT THUMB RELEASE TRIGGER FINGER;  Surgeon: Betha Loa, MD;  Location: [facility];  Service: Orthopedics;  Laterality: Left;   VAGINAL HYSTERECTOMY  05/03/2002   VEIN LIGATION AND STRIPPING  2009    FAMILY HISTORY: Family History  Adopted: Yes  Problem Relation Age of Onset   Cancer Other         NO FAMILY HX   Sleep apnea Neg Hx     SOCIAL HISTORY: Social History  Socioeconomic History   Marital status: Divorced    Spouse name: Not on file   Number of children: Not on file   Years of education: Not on file   Highest education level: Not on file  Occupational History   Not on file  Tobacco Use   Smoking status: Former    Packs/day: 1.50    Years: 25.00    Additional pack years: 0.00    Total pack years: 37.50    Types: Cigarettes    Quit date: 01/08/1993    Years since quitting: 29.9   Smokeless tobacco: Never  Vaping Use   Vaping Use: Never used  Substance and Sexual Activity   Alcohol use: Yes    Alcohol/week: 1.0 standard drink of alcohol    Types: 1 Standard drinks or equivalent per week    Comment: rarely   Drug use: No   Sexual activity: Not Currently    Partners: Male    Birth control/protection: Post-menopausal, Surgical    Comment: TVH  Other Topics Concern   Not on file  Social History Narrative   Not on file   Social Determinants of Health   Financial Resource Strain: Not on file  Food Insecurity: Not on file  Transportation Needs: Not on file  Physical Activity: Not on file  Stress: Not on file  Social Connections: Not on file  Intimate Partner Violence: Not on file     PHYSICAL EXAM  There were no vitals filed for this visit.  There is no height or weight on file to calculate  BMI.  Generalized: Well developed, in no acute distress  Cardiology: normal rate and rhythm, no murmur noted Respiratory: clear to auscultation bilaterally  Neurological examination  Mentation: Alert oriented to time, place, history taking. Follows all commands speech and language fluent Cranial nerve II-XII: Pupils were equal round reactive to light. Extraocular movements were full, visual field were full  Motor: The motor testing reveals 5 over 5 strength of all 4 extremities. Good symmetric motor tone is noted throughout.  Gait and station: Gait is normal.    DIAGNOSTIC DATA (LABS, IMAGING, TESTING) - I reviewed patient records, labs, notes, testing and imaging myself where available.      No data to display           Lab Results  Component Value Date   WBC 6.8 02/16/2020   HGB 14.0 02/16/2020   HCT 43.2 02/16/2020   MCV 93.7 02/16/2020   PLT 240 02/16/2020      Component Value Date/Time   NA 145 (H) 05/02/2020 1405   K 4.3 05/02/2020 1405   CL 106 05/02/2020 1405   CO2 27 05/02/2020 1405   GLUCOSE 81 05/02/2020 1405   GLUCOSE 103 (H) 02/16/2020 1855   BUN 12 05/02/2020 1405   CREATININE 0.53 (L) 05/02/2020 1405   CREATININE 0.70 01/05/2014 1119   CALCIUM 9.2 05/02/2020 1405   PROT 6.2 05/02/2020 1405   ALBUMIN 4.7 05/02/2020 1405   AST 18 05/02/2020 1405   ALT 15 05/02/2020 1405   ALKPHOS 85 05/02/2020 1405   BILITOT 0.2 05/02/2020 1405   GFRNONAA 98 05/02/2020 1405   GFRAA 113 05/02/2020 1405   No results found for: "CHOL", "HDL", "LDLCALC", "LDLDIRECT", "TRIG", "CHOLHDL" No results found for: "HGBA1C" No results found for: "VITAMINB12" No results found for: "TSH"   ASSESSMENT AND PLAN 72 y.o. year old female  has a past medical history of Arthritis, Complication of anesthesia, Hiatal hernia, Hypothyroidism, Lung nodule,  Plantar fasciitis (2019), PONV (postoperative nausea and vomiting), Rosacea, Strabismus, Thyroid disease, and Urinary incontinence.  here with   No diagnosis found.    Meredith Staggers is doing well on CPAP therapy. Compliance report reveals 83% daily and 4 hour usage. We have discussed her concerns with CPAP therapy. She reports weight loss around 20 pounds and wishes to repeat HST to evaluate continued need for CPAP. We have discussed possibility of Inspire versus oral appliance if appropriate based on study results. She has previous failed oral appliance but willing to reconsider. For now, she was encouraged to continue using CPAP nightly and for greater than 4 hours each night. Risks of untreated sleep apnea review and education materials provided. Healthy lifestyle habits encouraged. She will repeat the sleep study. She will follow up pending HST results. She verbalizes understanding and agreement with this plan.    No orders of the defined types were placed in this encounter.    No orders of the defined types were placed in this encounter.     Shawnie Dapper, FNP-C 12/30/2022, 10:03 AM [facility] [address] [place], [place] [address] [phone]

## 2022-12-31 ENCOUNTER — Ambulatory Visit: Payer: PPO | Admitting: Family Medicine

## 2023-01-07 ENCOUNTER — Encounter (HOSPITAL_BASED_OUTPATIENT_CLINIC_OR_DEPARTMENT_OTHER): Payer: Self-pay | Admitting: Obstetrics & Gynecology

## 2023-01-07 ENCOUNTER — Ambulatory Visit (HOSPITAL_BASED_OUTPATIENT_CLINIC_OR_DEPARTMENT_OTHER): Payer: Medicare HMO | Admitting: Obstetrics & Gynecology

## 2023-01-07 ENCOUNTER — Other Ambulatory Visit (HOSPITAL_COMMUNITY)
Admission: RE | Admit: 2023-01-07 | Discharge: 2023-01-07 | Disposition: A | Discharge status: Home / Self Care | Payer: Medicare HMO | Source: Ambulatory Visit | Attending: Obstetrics & Gynecology | Admitting: Obstetrics & Gynecology

## 2023-01-07 VITALS — BP 169/75 | HR 53 | Ht 65.0 in | Wt 157.8 lb

## 2023-01-07 DIAGNOSIS — M8588 Other specified disorders of bone density and structure, other site: Secondary | ICD-10-CM

## 2023-01-07 DIAGNOSIS — Z01419 Encounter for gynecological examination (general) (routine) without abnormal findings: Secondary | ICD-10-CM | POA: Diagnosis not present

## 2023-01-07 DIAGNOSIS — Z124 Encounter for screening for malignant neoplasm of cervix: Secondary | ICD-10-CM

## 2023-01-07 DIAGNOSIS — N3281 Overactive bladder: Secondary | ICD-10-CM

## 2023-01-07 DIAGNOSIS — E039 Hypothyroidism, unspecified: Secondary | ICD-10-CM

## 2023-01-07 DIAGNOSIS — Z90710 Acquired absence of both cervix and uterus: Secondary | ICD-10-CM | POA: Diagnosis not present

## 2023-01-07 MED ORDER — OXYBUTYNIN CHLORIDE ER 5 MG PO TB24: 5.0000 mg | ORAL_TABLET | Freq: Every day | ORAL | 3 refills | Status: DC
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
## 2023-01-09 LAB — CYTOLOGY - PAP: Diagnosis: NEGATIVE

## 2023-01-10 DIAGNOSIS — M1712 Unilateral primary osteoarthritis, left knee: Secondary | ICD-10-CM | POA: Diagnosis not present

## 2023-01-16 NOTE — Progress Notes (Deleted)
PATIENT: Kara Mitchell DOB: Apr 12, 1951  REASON FOR VISIT: follow up HISTORY FROM: patient  No chief complaint on file.    HISTORY OF PRESENT ILLNESS:  01/16/23 ALL:  Kara Mitchell returns for follow up for OSA on CPAP.   11/01/2021 ALL: Kara Mitchell is a 72 y.o. female here today for follow up for OSA on CPAP. Her download below demonstrates acceptable compliance for days worn and for > 4 hours. Her AHI is 2.2 on autotitration 5-11 cmH2O. She reports that she doesn't love the machine and has expressed interest in stopping CPAP or trying the [place] device. She has not noted any significant benefit with using CPAP. She feels that she fights with the machine more than she rests with it. Her HST revealed that she had mild sleep apnea with an AHI of 14.6 and O2 nadir of 86%. She has a hx of transient global amnesia. Workup has been unremarkable and she feels event was triggered by stress. She has recently lost some weight and is interested in retesting at this time.     HISTORY: (copied from Dr Teofilo Pod previous note)  Kara Mitchell is a 72 year old right-handed woman with an underlying medical history of arthritis, mild sleep apnea, hypothyroidism, lung nodule, plantar fasciitis, diverticulosis, reflux disease, prior smoking, mildly overweight state and history of rosacea, who presents for follow-up consultation of her mild sleep apnea.  She was last seen on 05/31/2020, at which time she reported feeling stable, no recent change in her symptoms or new symptoms from the neurological standpoint, no further episodes similar to TGA.  She had not quite started her AutoPap treatment.   Today, 05/01/2021: I reviewed her AutoPap compliance data from 04/01/2021 through 04/30/2021, which is a total of 30 days, during which time she used her machine 27 days with percent use days greater than 4 hours at 87%, indicating very good compliance with an average usage for days on treatment of 6 hours and 57 minutes,  residual AHI at goal at 3.4/h, 95th percentile of pressure at 9.5 cm with a range of 5 to 11 cm with EPR, leak on the higher side with a 95th percentile at 32 L/min.  Leak has increased in the recent past but has been fluctuating.  Her set up date was 02/02/2021 but she needed a different machine as the first machine was not working properly.  She started using her machine on 02/23/2021 and with new equipment.  Reports struggling with a fullface mask, she started off with a F 20 fullface mask but is now using a hybrid style from [employer].  She is still struggling with air leakage and tolerance of the pressure.  She is motivated to continue with treatment.  She reports interim difficulty with her Crestor.  She started feeling joint pain and muscle aches and eventually stopped the Crestor about 2 weeks ago after discussing with her primary care physician.  She has a follow-up appointment today.  She did bring her blood work from the recent past.  I reviewed her blood test results.  On 03/01/2020 her LDL was 122.  In July 2022 her LDL was 97.  She has had an increase in her triglycerides.  She is trying to eat well and hydrate well but does report that she could do better with her eating habits.  She has been given some dietary advice from the primary care physician's office as I understand but would like to discuss more in depth today at her appointment  this afternoon.    REVIEW OF SYSTEMS: Out of a complete 14 system review of symptoms, the patient complains only of the following symptoms, none and all other reviewed systems are negative.  ESS: 4  ALLERGIES: Allergies  Allergen Reactions   Fentanyl Other (See Comments)    Low blood pressure   Meloxicam Diarrhea   Neosporin [Neomycin-Bacitracin Zn-Polymyx] Other (See Comments)    whelps   Polysporin [Bacitracin-Polymyxin B] Other (See Comments)    whelps   Tetanus Toxoids Other (See Comments)    fainting   Versed [Midazolam] Other (See Comments)     Low blood pressure    HOME MEDICATIONS: Outpatient Medications Prior to Visit  Medication Sig Dispense Refill   diphenhydrAMINE-APAP, sleep, (TYLENOL PM EXTRA STRENGTH PO) Take by mouth. 1 every night     ibuprofen (ADVIL,MOTRIN) 800 MG tablet Take 1 tablet (800 mg total) by mouth every 8 (eight) hours as needed. 40 tablet 0   oxybutynin (DITROPAN-XL) 5 MG 24 hr tablet Take 1 tablet (5 mg total) by mouth daily. 90 tablet 3   pantoprazole (PROTONIX) 40 MG tablet Take 40 mg by mouth daily.     thyroid (ARMOUR) 30 MG tablet Take 30 mg by mouth daily.     vitamin B-12 (CYANOCOBALAMIN) 250 MCG tablet Take 250 mcg by mouth daily.     No facility-administered medications prior to visit.    PAST MEDICAL HISTORY: Past Medical History:  Diagnosis Date   Arthritis    Complication of anesthesia    no fent/versed   Hiatal hernia    Hypothyroidism    Lung nodule    right, lower-seen on CT, had second CT 01/06/14-followed by Dr Dorris Fetch   Plantar fasciitis 2019   Left    PONV (postoperative nausea and vomiting)    Rosacea    Strabismus    Thyroid disease    Urinary incontinence     PAST SURGICAL HISTORY: Past Surgical History:  Procedure Laterality Date   BACK SURGERY  2012   lumb lam   BLEPHAROPLASTY  10/2012   BREAST BIOPSY  1/95   CYSTOCELE REPAIR  2007   DISTAL INTERPHALANGEAL JOINT FUSION Right 12/21/2015   Procedure: DEBRIDEMENT OF RIGHT DISTAL INTERPHALANGEAL JOINT ;  Surgeon: Betha Loa, MD;  Location: [facility];  Service: Orthopedics;  Laterality: Right;   ENTEROCELE REPAIR  2010   ESOPHAGEAL DILATION  [id number]   KNEE SURGERY     x2-lt   MASS EXCISION Right 12/21/2015   Procedure: RIGHT INDEX EXCISION MASS;  Surgeon: Betha Loa, MD;  Location: [facility];  Service: Orthopedics;  Laterality: Right;   OTHER SURGICAL HISTORY  2001   HNP L4-5, Dr. Lovell Sheehan   TONSILLECTOMY AND ADENOIDECTOMY  1974   TRIGGER FINGER RELEASE Right 02/18/2014    Procedure: RELEASE A-1 PULLEY RIGHT THUMB/POSSIBLE INJ TO LEFT THUMB;  Surgeon: Wyn Forster, MD;  Location: [facility];  Service: Orthopedics;  Laterality: Right;   TRIGGER FINGER RELEASE Left 05/16/2016   Procedure: LEFT THUMB RELEASE TRIGGER FINGER;  Surgeon: Betha Loa, MD;  Location: [facility];  Service: Orthopedics;  Laterality: Left;   VAGINAL HYSTERECTOMY  05/03/2002   VEIN LIGATION AND STRIPPING  2009    FAMILY HISTORY: Family History  Adopted: Yes  Problem Relation Age of Onset   Cancer Other         NO FAMILY HX   Sleep apnea Neg Hx     SOCIAL HISTORY:  Social History   Socioeconomic History   Marital status: Divorced    Spouse name: Not on file   Number of children: Not on file   Years of education: Not on file   Highest education level: Not on file  Occupational History   Not on file  Tobacco Use   Smoking status: Former    Packs/day: 1.50    Years: 25.00    Additional pack years: 0.00    Total pack years: 37.50    Types: Cigarettes    Quit date: 01/08/1993    Years since quitting: 30.0   Smokeless tobacco: Never  Vaping Use   Vaping Use: Never used  Substance and Sexual Activity   Alcohol use: Yes    Alcohol/week: 1.0 standard drink of alcohol    Types: 1 Standard drinks or equivalent per week    Comment: rarely   Drug use: No   Sexual activity: Not Currently    Partners: Male    Birth control/protection: Post-menopausal, Surgical    Comment: TVH  Other Topics Concern   Not on file  Social History Narrative   Not on file   Social Determinants of Health   Financial Resource Strain: Not on file  Food Insecurity: Not on file  Transportation Needs: Not on file  Physical Activity: Not on file  Stress: Not on file  Social Connections: Not on file  Intimate Partner Violence: Not on file     PHYSICAL EXAM  There were no vitals filed for this visit.  There is no height or weight on file to calculate  BMI.  Generalized: Well developed, in no acute distress  Cardiology: normal rate and rhythm, no murmur noted Respiratory: clear to auscultation bilaterally  Neurological examination  Mentation: Alert oriented to time, place, history taking. Follows all commands speech and language fluent Cranial nerve II-XII: Pupils were equal round reactive to light. Extraocular movements were full, visual field were full  Motor: The motor testing reveals 5 over 5 strength of all 4 extremities. Good symmetric motor tone is noted throughout.  Gait and station: Gait is normal.    DIAGNOSTIC DATA (LABS, IMAGING, TESTING) - I reviewed patient records, labs, notes, testing and imaging myself where available.      No data to display           Lab Results  Component Value Date   WBC 6.8 02/16/2020   HGB 14.0 02/16/2020   HCT 43.2 02/16/2020   MCV 93.7 02/16/2020   PLT 240 02/16/2020      Component Value Date/Time   NA 145 (H) 05/02/2020 1405   K 4.3 05/02/2020 1405   CL 106 05/02/2020 1405   CO2 27 05/02/2020 1405   GLUCOSE 81 05/02/2020 1405   GLUCOSE 103 (H) 02/16/2020 1855   BUN 12 05/02/2020 1405   CREATININE 0.53 (L) 05/02/2020 1405   CREATININE 0.70 01/05/2014 1119   CALCIUM 9.2 05/02/2020 1405   PROT 6.2 05/02/2020 1405   ALBUMIN 4.7 05/02/2020 1405   AST 18 05/02/2020 1405   ALT 15 05/02/2020 1405   ALKPHOS 85 05/02/2020 1405   BILITOT 0.2 05/02/2020 1405   GFRNONAA 98 05/02/2020 1405   GFRAA 113 05/02/2020 1405   No results found for: "CHOL", "HDL", "LDLCALC", "LDLDIRECT", "TRIG", "CHOLHDL" No results found for: "HGBA1C" No results found for: "VITAMINB12" No results found for: "TSH"   ASSESSMENT AND PLAN 72 y.o. year old female  has a past medical history of Arthritis, Complication of anesthesia, Hiatal  hernia, Hypothyroidism, Lung nodule, Plantar fasciitis (2019), PONV (postoperative nausea and vomiting), Rosacea, Strabismus, Thyroid disease, and Urinary incontinence.  here with   No diagnosis found.    Meredith Staggers is doing well on CPAP therapy. Compliance report reveals 83% daily and 4 hour usage. We have discussed her concerns with CPAP therapy. She reports weight loss around 20 pounds and wishes to repeat HST to evaluate continued need for CPAP. We have discussed possibility of Inspire versus oral appliance if appropriate based on study results. She has previous failed oral appliance but willing to reconsider. For now, she was encouraged to continue using CPAP nightly and for greater than 4 hours each night. Risks of untreated sleep apnea review and education materials provided. Healthy lifestyle habits encouraged. She will repeat the sleep study. She will follow up pending HST results. She verbalizes understanding and agreement with this plan.    No orders of the defined types were placed in this encounter.    No orders of the defined types were placed in this encounter.     Shawnie Dapper, FNP-C 01/16/2023, 12:57 PM [facility] [address] [place], [place] [address] [phone]

## 2023-01-17 ENCOUNTER — Ambulatory Visit
Admission: RE | Admit: 2023-01-17 | Discharge: 2023-01-17 | Disposition: A | Discharge status: Home / Self Care | Payer: Medicare HMO | Source: Ambulatory Visit | Attending: Thoracic Surgery (Cardiothoracic Vascular Surgery) | Admitting: Thoracic Surgery (Cardiothoracic Vascular Surgery)

## 2023-01-17 DIAGNOSIS — R911 Solitary pulmonary nodule: Secondary | ICD-10-CM

## 2023-01-21 ENCOUNTER — Ambulatory Visit: Payer: PPO | Admitting: Thoracic Surgery (Cardiothoracic Vascular Surgery)

## 2023-01-21 ENCOUNTER — Encounter: Payer: Self-pay | Admitting: Thoracic Surgery (Cardiothoracic Vascular Surgery)

## 2023-01-21 ENCOUNTER — Ambulatory Visit: Payer: Medicare HMO | Admitting: Thoracic Surgery (Cardiothoracic Vascular Surgery)

## 2023-01-21 VITALS — BP 153/76 | HR 63 | Resp 20 | Ht 65.0 in | Wt 154.0 lb

## 2023-01-21 DIAGNOSIS — R911 Solitary pulmonary nodule: Secondary | ICD-10-CM | POA: Diagnosis not present
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
## 2023-01-22 ENCOUNTER — Ambulatory Visit: Payer: PPO | Admitting: Family Medicine

## 2023-01-22 NOTE — Patient Instructions (Incomplete)
Please continue using your CPAP regularly. While your insurance requires that you use CPAP at least 4 hours each night on 70% of the nights, I recommend, that you not skip any nights and use it throughout the night if you can. Getting used to CPAP and staying with the treatment long term does take time and patience and discipline. Untreated obstructive sleep apnea when it is moderate to severe can have an adverse impact on cardiovascular health and raise her risk for heart disease, arrhythmias, hypertension, congestive heart failure, stroke and diabetes. Untreated obstructive sleep apnea causes sleep disruption, nonrestorative sleep, and sleep deprivation. This can have an impact on your day to day functioning and cause daytime sleepiness and impairment of cognitive function, memory loss, mood disturbance, and problems focussing. Using CPAP regularly can improve these symptoms.  We will update supply orders, today. Please continue to monitor symptoms. Please seek emergency medical attention for any further events. I will order an MRI for evaluation.   Follow up in 1 year

## 2023-01-22 NOTE — Progress Notes (Unsigned)
PATIENT: Kara Mitchell DOB: Jun 01, 1951  REASON FOR VISIT: follow up HISTORY FROM: patient  No chief complaint on file.    HISTORY OF PRESENT ILLNESS:  01/22/23 ALL:  Kara Mitchell returns for follow up for OSA on CPAP. She was last seen 10/2021 and was not happy with CPAP therapy. She wished to repeat HST to evaluate for possible consideration for Inspire. HST 08/2022 showed mild OSA with AHI of 12.7/h and O2 nadir of 83%. CPAP therapy recommended.     11/01/2021 ALL: Kara Mitchell is a 72 y.o. female here today for follow up for OSA on CPAP. Her download below demonstrates acceptable compliance for days worn and for > 4 hours. Her AHI is 2.2 on autotitration 5-11 cmH2O. She reports that she doesn't love the machine and has expressed interest in stopping CPAP or trying the Thomson device. She has not noted any significant benefit with using CPAP. She feels that she fights with the machine more than she rests with it. Her HST revealed that she had mild sleep apnea with an AHI of 14.6 and O2 nadir of 86%. She has a hx of transient global amnesia. Workup has been unremarkable and she feels event was triggered by stress. She has recently lost some weight and is interested in retesting at this time.     HISTORY: (copied from Dr Teofilo Pod previous note)  Ms. Midgley is a 72 year old right-handed woman with an underlying medical history of arthritis, mild sleep apnea, hypothyroidism, lung nodule, plantar fasciitis, diverticulosis, reflux disease, prior smoking, mildly overweight state and history of rosacea, who presents for follow-up consultation of her mild sleep apnea.  She was last seen on 05/31/2020, at which time she reported feeling stable, no recent change in her symptoms or new symptoms from the neurological standpoint, no further episodes similar to TGA.  She had not quite started her AutoPap treatment.   Today, 05/01/2021: I reviewed her AutoPap compliance data from 04/01/2021 through  04/30/2021, which is a total of 30 days, during which time she used her machine 27 days with percent use days greater than 4 hours at 87%, indicating very good compliance with an average usage for days on treatment of 6 hours and 57 minutes, residual AHI at goal at 3.4/h, 95th percentile of pressure at 9.5 cm with a range of 5 to 11 cm with EPR, leak on the higher side with a 95th percentile at 32 L/min.  Leak has increased in the recent past but has been fluctuating.  Her set up date was 02/02/2021 but she needed a different machine as the first machine was not working properly.  She started using her machine on 02/23/2021 and with new equipment.  Reports struggling with a fullface mask, she started off with a F 20 fullface mask but is now using a hybrid style from [employer].  She is still struggling with air leakage and tolerance of the pressure.  She is motivated to continue with treatment.  She reports interim difficulty with her Crestor.  She started feeling joint pain and muscle aches and eventually stopped the Crestor about 2 weeks ago after discussing with her primary care physician.  She has a follow-up appointment today.  She did bring her blood work from the recent past.  I reviewed her blood test results.  On 03/01/2020 her LDL was 122.  In July 2022 her LDL was 97.  She has had an increase in her triglycerides.  She is trying to eat well and  hydrate well but does report that she could do better with her eating habits.  She has been given some dietary advice from the primary care physician's office as I understand but would like to discuss more in depth today at her appointment this afternoon.    REVIEW OF SYSTEMS: Out of a complete 14 system review of symptoms, the patient complains only of the following symptoms, none and all other reviewed systems are negative.  ESS: 4  ALLERGIES: Allergies  Allergen Reactions   Fentanyl Other (See Comments)    Low blood pressure   Meloxicam Diarrhea    Neosporin [Neomycin-Bacitracin Zn-Polymyx] Other (See Comments)    whelps   Polysporin [Bacitracin-Polymyxin B] Other (See Comments)    whelps   Tetanus Toxoids Other (See Comments)    fainting   Versed [Midazolam] Other (See Comments)    Low blood pressure    HOME MEDICATIONS: Outpatient Medications Prior to Visit  Medication Sig Dispense Refill   diphenhydrAMINE-APAP, sleep, (TYLENOL PM EXTRA STRENGTH PO) Take by mouth. 1 every night     ibuprofen (ADVIL,MOTRIN) 800 MG tablet Take 1 tablet (800 mg total) by mouth every 8 (eight) hours as needed. 40 tablet 0   oxybutynin (DITROPAN-XL) 5 MG 24 hr tablet Take 1 tablet (5 mg total) by mouth daily. 90 tablet 3   pantoprazole (PROTONIX) 40 MG tablet Take 40 mg by mouth daily.     thyroid (ARMOUR) 30 MG tablet Take 30 mg by mouth daily.     vitamin B-12 (CYANOCOBALAMIN) 250 MCG tablet Take 250 mcg by mouth daily.     No facility-administered medications prior to visit.    PAST MEDICAL HISTORY: Past Medical History:  Diagnosis Date   Arthritis    Complication of anesthesia    no fent/versed   Hiatal hernia    Hypothyroidism    Lung nodule    right, lower-seen on CT, had second CT 01/06/14-followed by Dr Dorris Fetch   Plantar fasciitis 2019   Left    PONV (postoperative nausea and vomiting)    Rosacea    Strabismus    Thyroid disease    Urinary incontinence     PAST SURGICAL HISTORY: Past Surgical History:  Procedure Laterality Date   BACK SURGERY  2012   lumb lam   BLEPHAROPLASTY  10/2012   BREAST BIOPSY  1/95   CYSTOCELE REPAIR  2007   DISTAL INTERPHALANGEAL JOINT FUSION Right 12/21/2015   Procedure: DEBRIDEMENT OF RIGHT DISTAL INTERPHALANGEAL JOINT ;  Surgeon: Betha Loa, MD;  [facility];  Service: Orthopedics;  Laterality: Right;   ENTEROCELE REPAIR  2010   ESOPHAGEAL DILATION  1478;2956   KNEE SURGERY     x2-lt   MASS EXCISION Right 12/21/2015   Procedure: RIGHT INDEX EXCISION MASS;   Surgeon: Betha Loa, MD;  Location: [facility];  Service: Orthopedics;  Laterality: Right;   OTHER SURGICAL HISTORY  2001   HNP L4-5, Dr. Lovell Sheehan   TONSILLECTOMY AND ADENOIDECTOMY  1974   TRIGGER FINGER RELEASE Right 02/18/2014   Procedure: RELEASE A-1 PULLEY RIGHT THUMB/POSSIBLE INJ TO LEFT THUMB;  Surgeon: Wyn Forster, MD;  Location: [facility];  Service: Orthopedics;  Laterality: Right;   TRIGGER FINGER RELEASE Left 05/16/2016   Procedure: LEFT THUMB RELEASE TRIGGER FINGER;  Surgeon: Betha Loa, MD;  Location: [facility];  Service: Orthopedics;  Laterality: Left;   VAGINAL HYSTERECTOMY  05/03/2002   VEIN LIGATION AND STRIPPING  2009  FAMILY HISTORY: Family History  Adopted: Yes  Problem Relation Age of Onset   Cancer Other         NO FAMILY HX   Sleep apnea Neg Hx     SOCIAL HISTORY: Social History   Socioeconomic History   Marital status: Divorced    Spouse name: Not on file   Number of children: Not on file   Years of education: Not on file   Highest education level: Not on file  Occupational History   Not on file  Tobacco Use   Smoking status: Former    Packs/day: 1.50    Years: 25.00    Additional pack years: 0.00    Total pack years: 37.50    Types: Cigarettes    Quit date: 01/08/1993    Years since quitting: 30.0   Smokeless tobacco: Never  Vaping Use   Vaping Use: Never used  Substance and Sexual Activity   Alcohol use: Yes    Alcohol/week: 1.0 standard drink of alcohol    Types: 1 Standard drinks or equivalent per week    Comment: rarely   Drug use: No   Sexual activity: Not Currently    Partners: Male    Birth control/protection: Post-menopausal, Surgical    Comment: TVH  Other Topics Concern   Not on file  Social History Narrative   Not on file   Social Determinants of Health   Financial Resource Strain: Not on file  Food Insecurity: Not on file  Transportation Needs: Not on file   Physical Activity: Not on file  Stress: Not on file  Social Connections: Not on file  Intimate Partner Violence: Not on file     PHYSICAL EXAM  There were no vitals filed for this visit.  There is no height or weight on file to calculate BMI.  Generalized: Well developed, in no acute distress  Cardiology: normal rate and rhythm, no murmur noted Respiratory: clear to auscultation bilaterally  Neurological examination  Mentation: Alert oriented to time, place, history taking. Follows all commands speech and language fluent Cranial nerve II-XII: Pupils were equal round reactive to light. Extraocular movements were full, visual field were full  Motor: The motor testing reveals 5 over 5 strength of all 4 extremities. Good symmetric motor tone is noted throughout.  Gait and station: Gait is normal.    DIAGNOSTIC DATA (LABS, IMAGING, TESTING) - I reviewed patient records, labs, notes, testing and imaging myself where available.      No data to display           Lab Results  Component Value Date   WBC 6.8 02/16/2020   HGB 14.0 02/16/2020   HCT 43.2 02/16/2020   MCV 93.7 02/16/2020   PLT 240 02/16/2020      Component Value Date/Time   NA 145 (H) 05/02/2020 1405   K 4.3 05/02/2020 1405   CL 106 05/02/2020 1405   CO2 27 05/02/2020 1405   GLUCOSE 81 05/02/2020 1405   GLUCOSE 103 (H) 02/16/2020 1855   BUN 12 05/02/2020 1405   CREATININE 0.53 (L) 05/02/2020 1405   CREATININE 0.70 01/05/2014 1119   CALCIUM 9.2 05/02/2020 1405   PROT 6.2 05/02/2020 1405   ALBUMIN 4.7 05/02/2020 1405   AST 18 05/02/2020 1405   ALT 15 05/02/2020 1405   ALKPHOS 85 05/02/2020 1405   BILITOT 0.2 05/02/2020 1405   GFRNONAA 98 05/02/2020 1405   GFRAA 113 05/02/2020 1405   No results found for: "CHOL", "HDL", "LDLCALC", "  LDLDIRECT", "TRIG", "CHOLHDL" No results found for: "HGBA1C" No results found for: "VITAMINB12" No results found for: "TSH"   ASSESSMENT AND PLAN 72 y.o. year old  female  has a past medical history of Arthritis, Complication of anesthesia, Hiatal hernia, Hypothyroidism, Lung nodule, Plantar fasciitis (2019), PONV (postoperative nausea and vomiting), Rosacea, Strabismus, Thyroid disease, and Urinary incontinence. here with   No diagnosis found.    Meredith Staggers is doing well on CPAP therapy. Compliance report reveals 77% daily and 73% 4 hour usage. We have discussed her concerns with CPAP therapy. HST 08/2022 shows mild OSA with total AHI 12.7/h. Not a candidate for Inspire. She has previous failed oral appliance but willing to reconsider. For now, she was encouraged to continue using CPAP nightly and for greater than 4 hours each night. Risks of untreated sleep apnea review and education materials provided. Healthy lifestyle habits encouraged. She will follow up in *** She verbalizes understanding and agreement with this plan.    No orders of the defined types were placed in this encounter.    No orders of the defined types were placed in this encounter.     Shawnie Dapper, FNP-C 01/22/2023, 4:28 PM [facility] [address] [place], [place] [address] [phone]

## 2023-01-23 ENCOUNTER — Encounter: Payer: Self-pay | Admitting: Family Medicine

## 2023-01-23 ENCOUNTER — Ambulatory Visit: Payer: Medicare HMO | Admitting: Family Medicine

## 2023-01-23 ENCOUNTER — Telehealth: Payer: Self-pay | Admitting: Family Medicine

## 2023-01-23 VITALS — BP 140/80 | HR 64 | Ht 65.0 in | Wt 158.0 lb

## 2023-01-23 DIAGNOSIS — G4733 Obstructive sleep apnea (adult) (pediatric): Secondary | ICD-10-CM | POA: Diagnosis not present

## 2023-01-23 DIAGNOSIS — G454 Transient global amnesia: Secondary | ICD-10-CM

## 2023-01-23 NOTE — Telephone Encounter (Signed)
sent to [facility] they obtain [employer] medicare auth [id number] 

## 2023-01-27 ENCOUNTER — Encounter: Payer: Self-pay | Admitting: Family Medicine

## 2023-01-30 ENCOUNTER — Ambulatory Visit
Admission: RE | Admit: 2023-01-30 | Discharge: 2023-01-30 | Disposition: A | Discharge status: Home / Self Care | Payer: Medicare HMO | Source: Ambulatory Visit | Attending: Family Medicine | Admitting: Family Medicine

## 2023-01-30 DIAGNOSIS — G454 Transient global amnesia: Secondary | ICD-10-CM | POA: Diagnosis not present

## 2023-01-30 MED ORDER — GADOPICLENOL 0.5 MMOL/ML IV SOLN
7.0000 mL | Freq: Once | INTRAVENOUS | Status: AC | PRN
  Administered 2023-01-30: 7 mL via INTRAVENOUS

## 2023-02-03 DIAGNOSIS — M25562 Pain in left knee: Secondary | ICD-10-CM | POA: Diagnosis not present

## 2023-02-05 ENCOUNTER — Telehealth: Payer: Self-pay | Admitting: *Deleted

## 2023-02-05 IMAGING — CT CT CHEST W/O CM
2 of 5 series · 15 of 36 positions shown, 18 images · non-contrast
Comparison: January 03, 2021.

CLINICAL DATA: A 70-year-old female presents for evaluation of lung
nodule, follow-up assessment.



[Series 4: chest 2.00 br40 s3 · coronal · 0.66mm/px · 3 of 133 slices shown]
[im 27/133  lung]
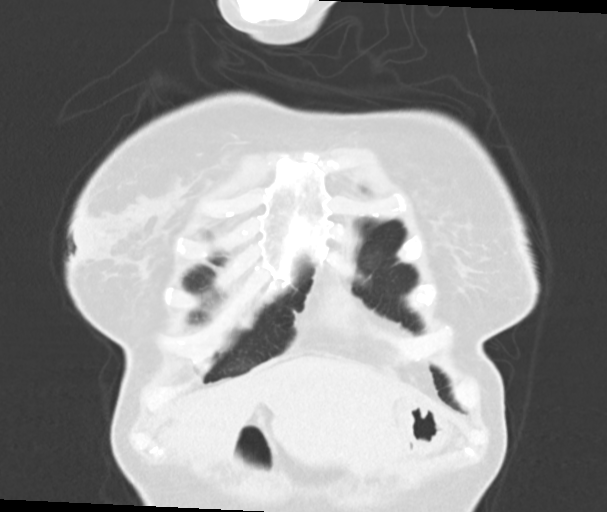
[im 53/133  lung]
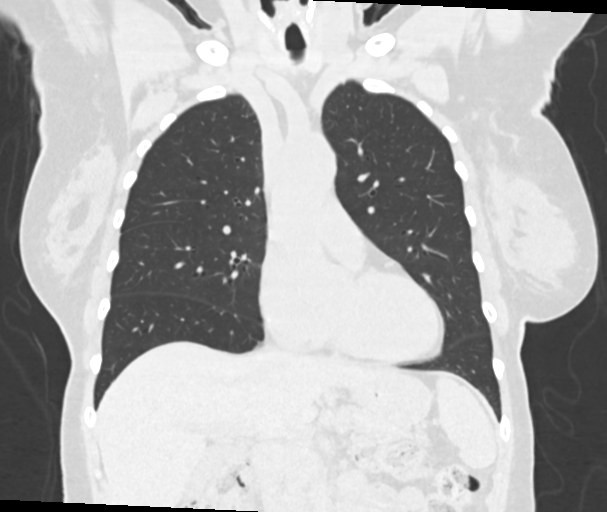
[im 80/133  lung]
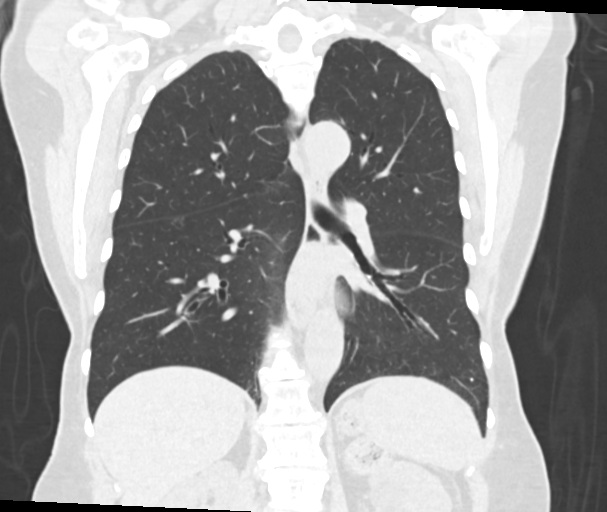

[Series 10: chest 1.00 br40 s3 super d · axial · 0.79mm/px · z∈[+1406,+1698]mm · 12 of 424 slices shown, 15 images]
[im 29/424  mediastinal]
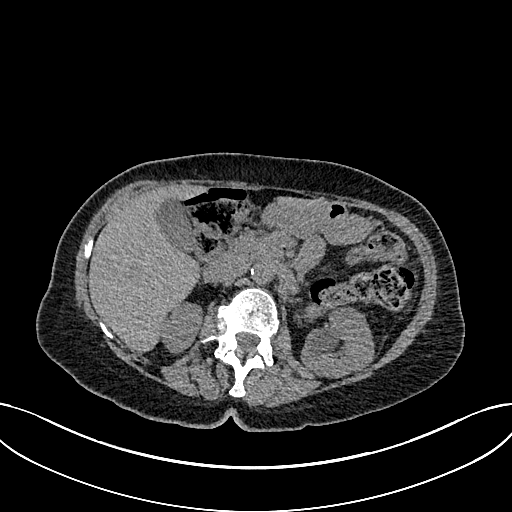
[im 29/424  lung]
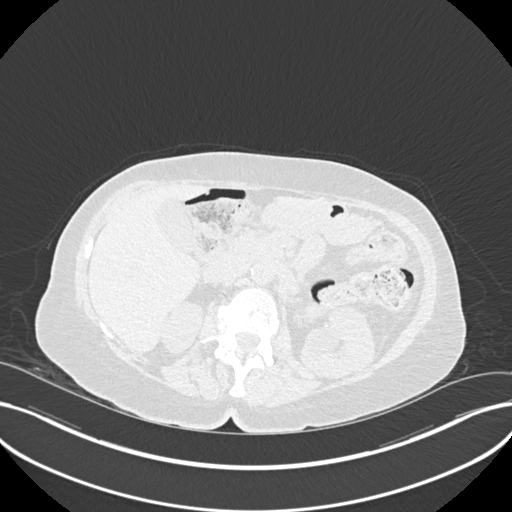
[im 57/424  lung]
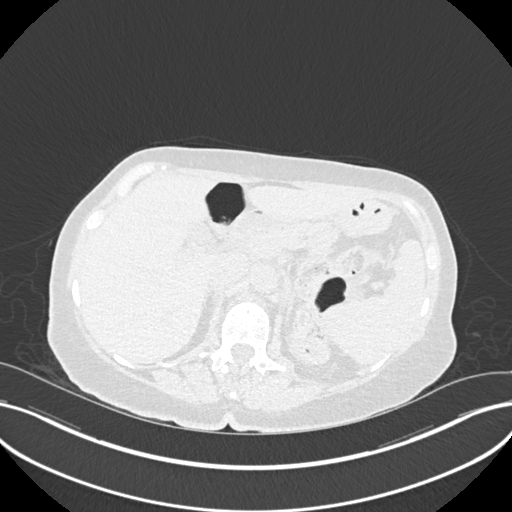
[im 85/424  lung]
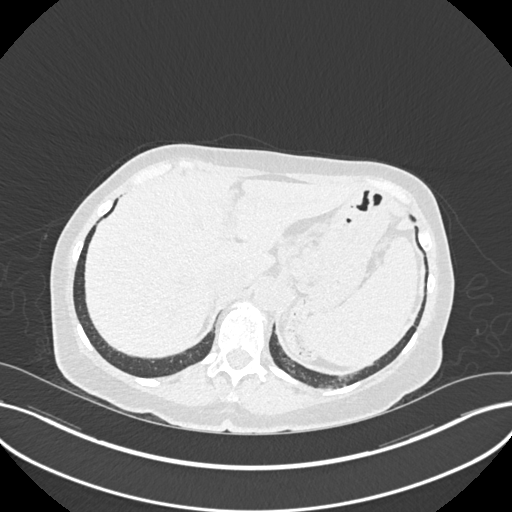
[im 142/424  lung]
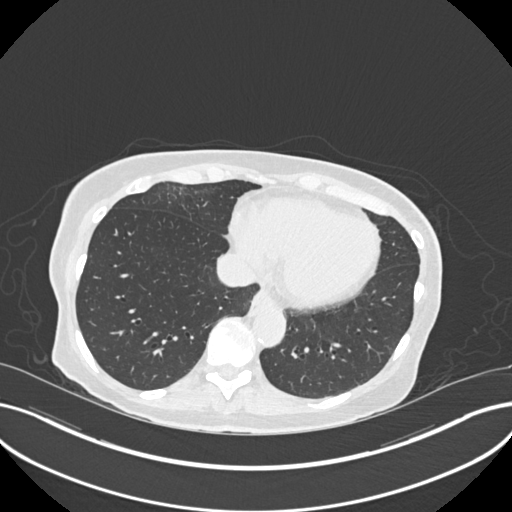
[im 170/424  mediastinal]
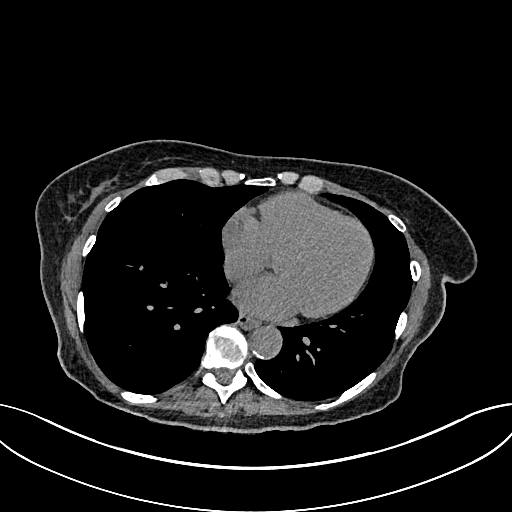
[im 170/424  lung]
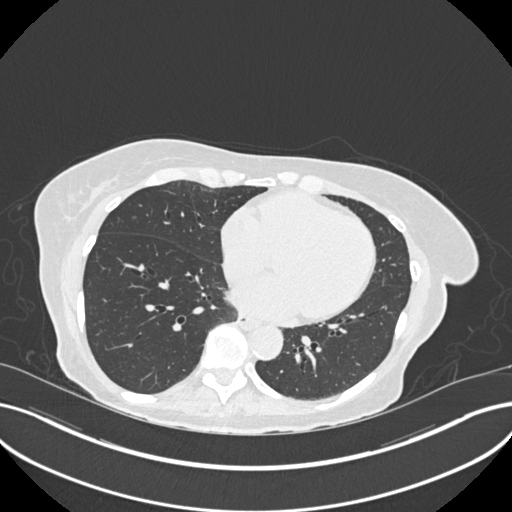
[im 198/424  lung]
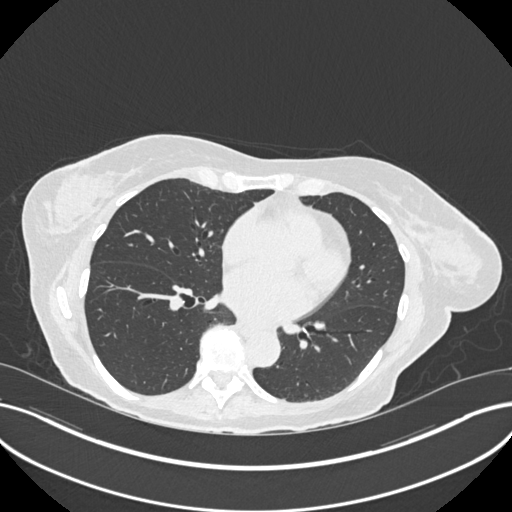
[im 226/424  lung]
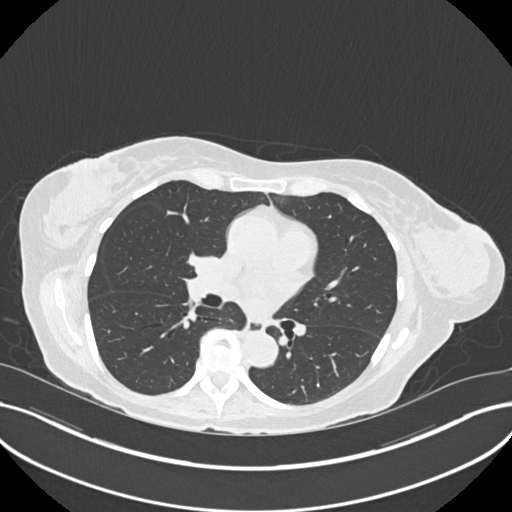
[im 254/424  lung]
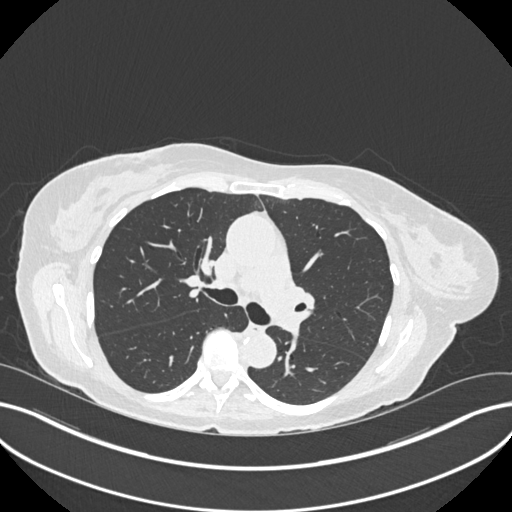
[im 283/424  mediastinal]
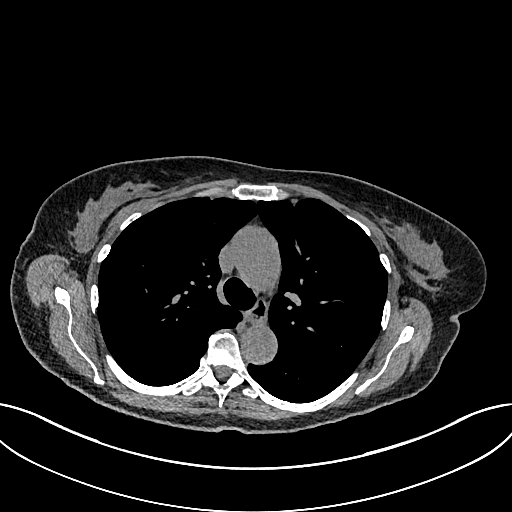
[im 283/424  lung]
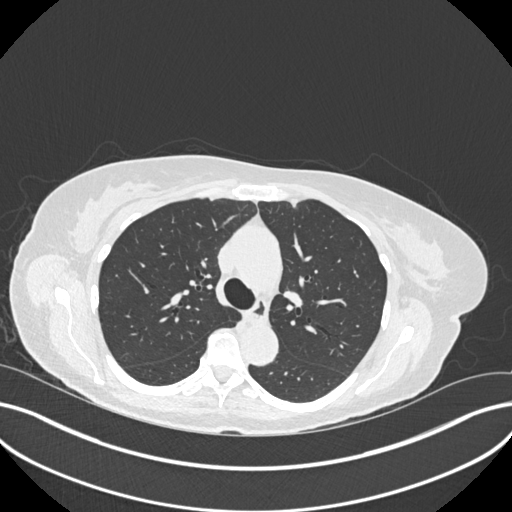
[im 339/424  lung]
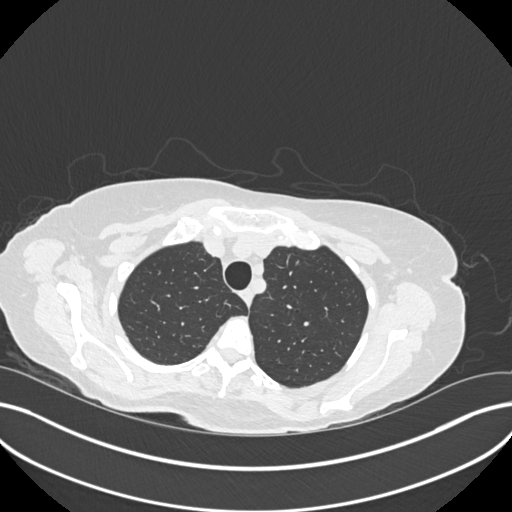
[im 367/424  lung]
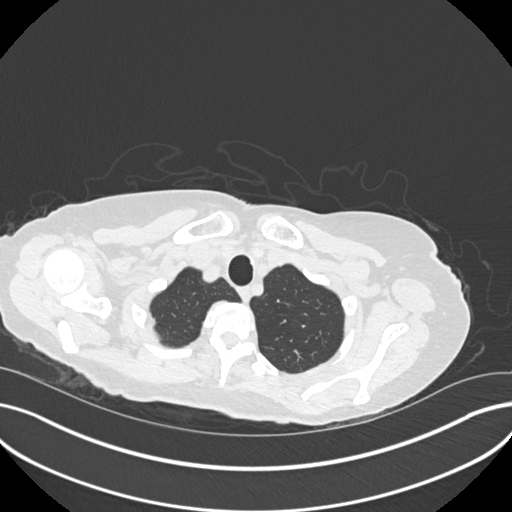
[im 395/424  lung]
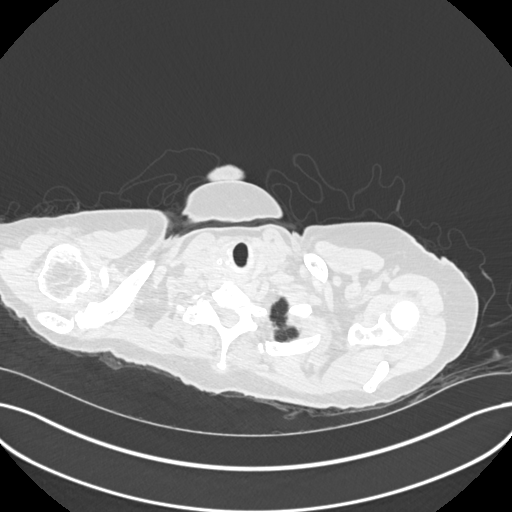

[15 of 36 positions shown; findings below may reference images not displayed]

FINDINGS: Cardiovascular: Calcified aortic atherosclerosis, mild. Normal heart
size without pericardial effusion or thickening. No aortic dilation.

Central pulmonary vessels are normal caliber. Limited assessment of
cardiovascular structures given lack of intravenous contrast.

Mediastinum/Nodes: No thoracic inlet, axillary, mediastinal or hilar
adenopathy. Esophagus grossly normal.

Lungs/Pleura: Granuloma in the LEFT lung base. Airways are patent.
No consolidation. No pleural effusion.

Very subtle ground-glass nodule in the RIGHT upper lobe measuring
approximately 3 mm is stable dating back to 6365. No new or
suspicious pulmonary nodules.

Upper Abdomen: No acute findings relative visualized portions the
liver, gallbladder, pancreas, spleen, adrenal glands or of the
kidneys

Stable homogeneously water dense cyst in the interpolar LEFT kidney
measuring 2 cm for which no additional follow-up is recommended
compatible with simple cyst on prior MRI. No adenopathy in the upper
abdomen.

Musculoskeletal: No acute bone finding. No destructive bone process.
Spinal degenerative changes.
IMPRESSION: 1. Very subtle ground-glass nodule in the RIGHT upper lobe measuring
approximately 3 mm is stable dating back to 6365. Also stable since
3862, stable for greater than 4 years and compatible with benign
finding for which no additional follow-up is recommended based on
current guidelines.
2. Aortic atherosclerosis.

Aortic Atherosclerosis ([id number]-[id number].Z).

## 2023-02-05 NOTE — Telephone Encounter (Signed)
Spoke with patient and advised MRI brain unremarkable, no concerns noted. Pt was very appreciative for the call.

## 2023-02-05 NOTE — Telephone Encounter (Signed)
-----   Message from Huston Foley, MD sent at 01/30/2023  5:20 PM EDT ----- Brain MRI was reported as unremarkable, please update patient.

## 2023-02-10 DIAGNOSIS — Z90710 Acquired absence of both cervix and uterus: Secondary | ICD-10-CM | POA: Diagnosis not present

## 2023-02-10 DIAGNOSIS — M8580 Other specified disorders of bone density and structure, unspecified site: Secondary | ICD-10-CM | POA: Diagnosis not present

## 2023-02-19 DIAGNOSIS — M2242 Chondromalacia patellae, left knee: Secondary | ICD-10-CM | POA: Diagnosis not present

## 2023-02-19 DIAGNOSIS — M1712 Unilateral primary osteoarthritis, left knee: Secondary | ICD-10-CM | POA: Diagnosis not present

## 2023-02-19 DIAGNOSIS — S83207A Unspecified tear of unspecified meniscus, current injury, left knee, initial encounter: Secondary | ICD-10-CM | POA: Diagnosis not present

## 2023-02-24 ENCOUNTER — Other Ambulatory Visit (HOSPITAL_BASED_OUTPATIENT_CLINIC_OR_DEPARTMENT_OTHER): Payer: Self-pay | Admitting: *Deleted

## 2023-02-24 DIAGNOSIS — M8588 Other specified disorders of bone density and structure, other site: Secondary | ICD-10-CM

## 2023-04-08 DIAGNOSIS — M542 Cervicalgia: Secondary | ICD-10-CM | POA: Diagnosis not present

## 2023-04-08 DIAGNOSIS — M4316 Spondylolisthesis, lumbar region: Secondary | ICD-10-CM | POA: Diagnosis not present

## 2023-04-15 ENCOUNTER — Ambulatory Visit: Payer: PPO | Admitting: Family Medicine

## 2023-05-17 DIAGNOSIS — R52 Pain, unspecified: Secondary | ICD-10-CM | POA: Diagnosis not present

## 2023-05-17 DIAGNOSIS — R0981 Nasal congestion: Secondary | ICD-10-CM | POA: Diagnosis not present

## 2023-05-17 DIAGNOSIS — U071 COVID-19: Secondary | ICD-10-CM | POA: Diagnosis not present

## 2023-06-10 DIAGNOSIS — E559 Vitamin D deficiency, unspecified: Secondary | ICD-10-CM | POA: Diagnosis not present

## 2023-06-10 DIAGNOSIS — E785 Hyperlipidemia, unspecified: Secondary | ICD-10-CM | POA: Diagnosis not present

## 2023-06-10 DIAGNOSIS — E039 Hypothyroidism, unspecified: Secondary | ICD-10-CM | POA: Diagnosis not present

## 2023-06-10 DIAGNOSIS — G454 Transient global amnesia: Secondary | ICD-10-CM | POA: Diagnosis not present

## 2023-06-16 DIAGNOSIS — E559 Vitamin D deficiency, unspecified: Secondary | ICD-10-CM | POA: Diagnosis not present

## 2023-06-16 DIAGNOSIS — E785 Hyperlipidemia, unspecified: Secondary | ICD-10-CM | POA: Diagnosis not present

## 2023-06-16 DIAGNOSIS — R911 Solitary pulmonary nodule: Secondary | ICD-10-CM | POA: Diagnosis not present

## 2023-06-16 DIAGNOSIS — K219 Gastro-esophageal reflux disease without esophagitis: Secondary | ICD-10-CM | POA: Diagnosis not present

## 2023-06-16 DIAGNOSIS — I2510 Atherosclerotic heart disease of native coronary artery without angina pectoris: Secondary | ICD-10-CM | POA: Diagnosis not present

## 2023-06-16 DIAGNOSIS — G454 Transient global amnesia: Secondary | ICD-10-CM | POA: Diagnosis not present

## 2023-06-16 DIAGNOSIS — G720 Drug-induced myopathy: Secondary | ICD-10-CM | POA: Diagnosis not present

## 2023-06-16 DIAGNOSIS — M8580 Other specified disorders of bone density and structure, unspecified site: Secondary | ICD-10-CM | POA: Diagnosis not present

## 2023-06-16 DIAGNOSIS — K635 Polyp of colon: Secondary | ICD-10-CM | POA: Diagnosis not present

## 2023-06-16 DIAGNOSIS — E039 Hypothyroidism, unspecified: Secondary | ICD-10-CM | POA: Diagnosis not present

## 2023-06-16 DIAGNOSIS — J439 Emphysema, unspecified: Secondary | ICD-10-CM | POA: Diagnosis not present

## 2023-06-16 DIAGNOSIS — I700 Atherosclerosis of aorta: Secondary | ICD-10-CM | POA: Diagnosis not present

## 2023-08-07 DIAGNOSIS — Z1231 Encounter for screening mammogram for malignant neoplasm of breast: Secondary | ICD-10-CM | POA: Diagnosis not present

## 2023-12-05 ENCOUNTER — Other Ambulatory Visit: Payer: Self-pay | Admitting: Thoracic Surgery (Cardiothoracic Vascular Surgery)

## 2023-12-05 DIAGNOSIS — R911 Solitary pulmonary nodule: Secondary | ICD-10-CM

## 2024-01-07 ENCOUNTER — Ambulatory Visit
Admission: RE | Admit: 2024-01-07 | Discharge: 2024-01-07 | Disposition: A | Discharge status: Home / Self Care | Source: Ambulatory Visit | Attending: Thoracic Surgery (Cardiothoracic Vascular Surgery) | Admitting: Thoracic Surgery (Cardiothoracic Vascular Surgery)

## 2024-01-07 DIAGNOSIS — R911 Solitary pulmonary nodule: Secondary | ICD-10-CM

## 2024-01-09 ENCOUNTER — Other Ambulatory Visit

## 2024-01-13 ENCOUNTER — Other Ambulatory Visit (HOSPITAL_BASED_OUTPATIENT_CLINIC_OR_DEPARTMENT_OTHER): Payer: Self-pay | Admitting: Obstetrics & Gynecology

## 2024-01-13 DIAGNOSIS — N3281 Overactive bladder: Secondary | ICD-10-CM

## 2024-01-20 ENCOUNTER — Other Ambulatory Visit: Payer: Self-pay | Admitting: Thoracic Surgery (Cardiothoracic Vascular Surgery)

## 2024-01-20 ENCOUNTER — Encounter: Payer: Self-pay | Admitting: Thoracic Surgery (Cardiothoracic Vascular Surgery)

## 2024-01-20 ENCOUNTER — Ambulatory Visit
Attending: Thoracic Surgery (Cardiothoracic Vascular Surgery) | Admitting: Thoracic Surgery (Cardiothoracic Vascular Surgery)

## 2024-01-20 VITALS — BP 150/54 | HR 67 | Resp 20 | Ht 65.0 in | Wt 162.0 lb

## 2024-01-20 DIAGNOSIS — R911 Solitary pulmonary nodule: Secondary | ICD-10-CM

## 2024-01-20 NOTE — Progress Notes (Signed)
 [address]       [place]ara [place] [address]             [phone]     HPI: Kara Mitchell returns for a scheduled follow-up regarding ascending aortic ectasia.  Kara Mitchell is a 73 year old woman with a history of tobacco use (quit 1994), groundglass lung nodules, hypothyroidism, aortic atherosclerosis, ascending aortic ectasia, and a hiatal hernia.  She was first found to have a lung nodule back in 2014.  That has remained stable over time.  She has had other groundglass nodules that have waxed and waned.  Last saw a year ago.  Her ascending aorta measured 3.9 cm.  Consistent with aortic ectasia.  In the interim since her last visit she has been feeling well.  She still having issues with home rebuilding.  No chest pain, pressure, tightness, or shortness of breath.  Requesting a CT for coronary calcium scoring.  Past Medical History:  Diagnosis Date   Arthritis    Complication of anesthesia    no fent/versed    Hiatal hernia    Hypothyroidism    Lung nodule    right, lower-seen on CT, had second CT 01/06/14-followed by Dr Luna Salinas   Plantar fasciitis 2019   Left    PONV (postoperative nausea and vomiting)    Rosacea    Strabismus    Thyroid  disease    Urinary incontinence     Current Outpatient Medications  Medication Sig Dispense Refill   diphenhydrAMINE-APAP, sleep, (TYLENOL  PM EXTRA STRENGTH PO) Take by mouth. 1 every night     ibuprofen  (ADVIL ,MOTRIN ) 800 MG tablet Take 1 tablet (800 mg total) by mouth every 8 (eight) hours as needed. 40 tablet 0   oxybutynin  (DITROPAN -XL) 5 MG 24 hr tablet Take 1 tablet (5 mg total) by mouth daily. 90 tablet 0   pantoprazole (PROTONIX) 40 MG tablet Take 40 mg by mouth daily.     thyroid  (ARMOUR) 30 MG tablet Take 30 mg by mouth daily.     vitamin B-12 (CYANOCOBALAMIN ) 250 MCG tablet Take 250 mcg by mouth daily.     No current facility-administered medications for this visit.    Physical Exam BP (!) 150/54   Pulse  67   Resp 20   Ht 5\' 5"  (1.651 m)   Wt 162 lb (73.5 kg)   LMP 08/26/2001 (LMP Unknown)   SpO2 96%   BMI 26.55 kg/m  73 year old woman in no acute distress Alert and oriented x 3 with no focal deficits Lungs clear with equal breath sounds bilaterally Cardiac regular rate and rhythm No peripheral edema  Diagnostic Tests: CT CHEST WITHOUT CONTRAST   TECHNIQUE: Multidetector CT imaging of the chest was performed following the standard protocol without IV contrast.   RADIATION DOSE REDUCTION: This exam was performed according to the departmental dose-optimization program which includes automated exposure control, adjustment of the mA and/or kV according to patient size and/or use of iterative reconstruction technique.   COMPARISON:  01/17/2023   FINDINGS: Cardiovascular: Normal heart size. No pericardial effusion. Foci of coronary artery calcifications. Aortic atherosclerosis. Ascending thoracic aorta again measures at the upper limits of normal with maximal diameter of 3.9 cm, unchanged.   Mediastinum/Nodes: No enlarged mediastinal or axillary lymph nodes. Thyroid  gland, trachea, and esophagus demonstrate no significant findings.   Lungs/Pleura: Stable 4 mm ground-glass right upper lobe nodule is essentially unchanged dating back to 2021, most compatible with a benign etiology, for which no follow-up imaging  is necessary. No new or suspicious pulmonary nodule. Calcified granuloma in the left lung base. No focal consolidation, pleural effusion, or pneumothorax.   Upper Abdomen: No acute abnormality.   Musculoskeletal: No acute osseous abnormality. No suspicious osseous lesion.   IMPRESSION: 1. Stable 4 mm ground-glass right upper lobe nodule is essentially unchanged dating back to 2021, most compatible with a benign etiology, for which no follow-up imaging is necessary. No new or suspicious pulmonary nodule. 2.  Aortic Atherosclerosis (ICD10-I70.0).      Electronically Signed   By: Kara Mitchell M.D.   On: 01/07/2024 10:19 I personally reviewed the CT images.  No change in the 4 mm right upper lobe nodule.  Aortic atherosclerosis and ascending aortic ectasia with a diameter of 3.9 cm.  Unchanged.  Mild coronary calcification.  Impression: Kara Mitchell is a 73 year old woman with a history of tobacco use (quit 1994), groundglass lung nodules, hypothyroidism, aortic atherosclerosis, ascending aortic ectasia, and a hiatal hernia.   Ascending aortic ectasia-aorta stable at 3.9 cm.  Does not need annual follow-up I recommended we do another CT in a couple of years just to make sure there is no change.  She requested the CT for coronary calcium scoring.  Not having any active anginal symptoms.  Will order that for her.  Right upper lobe lung nodule-stable at 4 mm.  No need for additional follow-up, but will be monitored along with her ascending aorta.  Plan: CT coronary calcium scoring Return in 2 years with CT of chest  I spent over 20 minutes reviewing records, images, and in consultation with Kara Mitchell today. Kara Higashi, MD [facility] [phone]

## 2024-01-29 ENCOUNTER — Ambulatory Visit: Admitting: Sports Medicine

## 2024-02-03 ENCOUNTER — Ambulatory Visit (HOSPITAL_COMMUNITY)
Admission: RE | Admit: 2024-02-03 | Discharge: 2024-02-03 | Disposition: A | Discharge status: Home / Self Care | Payer: Self-pay | Source: Ambulatory Visit | Attending: Thoracic Surgery (Cardiothoracic Vascular Surgery) | Admitting: Thoracic Surgery (Cardiothoracic Vascular Surgery)

## 2024-02-03 DIAGNOSIS — R911 Solitary pulmonary nodule: Secondary | ICD-10-CM | POA: Insufficient documentation

## 2024-02-09 ENCOUNTER — Telehealth: Payer: Self-pay | Admitting: Family Medicine

## 2024-02-09 NOTE — Telephone Encounter (Signed)
 Patient rescheduled appointment due to have not been wearing CPAP machine. Would like an appointment 3 months out.

## 2024-02-10 ENCOUNTER — Ambulatory Visit: Payer: Medicare HMO | Admitting: Family Medicine

## 2024-02-12 ENCOUNTER — Ambulatory Visit (HOSPITAL_BASED_OUTPATIENT_CLINIC_OR_DEPARTMENT_OTHER): Admitting: Obstetrics & Gynecology

## 2024-02-17 ENCOUNTER — Other Ambulatory Visit: Payer: Self-pay

## 2024-02-17 ENCOUNTER — Ambulatory Visit: Admitting: Sports Medicine

## 2024-02-17 VITALS — BP 138/85 | Ht 64.0 in | Wt 157.0 lb

## 2024-02-17 DIAGNOSIS — M1712 Unilateral primary osteoarthritis, left knee: Secondary | ICD-10-CM | POA: Diagnosis not present

## 2024-02-17 DIAGNOSIS — M25562 Pain in left knee: Secondary | ICD-10-CM | POA: Diagnosis not present

## 2024-02-17 NOTE — Progress Notes (Signed)
 Chief complaint evaluation of left knee arthritis  Patient has been evaluated by Dr. Ernie who has done x-ray and MRI.  Both these confirm significant arthritis.  On 1 occasion she had to have a Baker's cyst drained on the left.  He suggested she might be a candidate for total knee replacement but she feels she is not ready to move to that step. She comes today for suggestions of how she can stabilize the knee so that she will not have to have surgery in the near future.  I am interested she gets great relief with ibuprofen  800 and that takes well most all of her pain when she takes 1.  Her pain is primarily going down steps.  She also gets nighttime pain most evenings.  The knee is not giving out or locking up on her and she does not note significant swelling.  Physical exam Pleasant older female in no acute distress BP 138/85   Ht 5' 4 (1.626 m)   Wt 157 lb (71.2 kg)   LMP 08/26/2001 (LMP Unknown)   BMI 26.95 kg/m   Knee: Left Valgus alignment inspection with no erythema or effusion or obvious bony abnormalities. Palpation normal with no warmth or joint line tenderness or patellar tenderness or condyle tenderness. ROM normal in flexion and extension and lower leg rotation.  These seem well preserved. Ligaments with solid consistent endpoints including ACL, PCL, LCL, MCL. Negative Mcmurray's and provocative meniscal tests. Non painful patellar compression. Patellar and quadriceps tendons unremarkable.  Weak hip abduction left significant /RT is mild Quad strength mild to moderate weakness on the left  Ultrasound of the left knee She has a large suprapatellar pouch effusion Patellar and quadriceps tendons are intact Lateral joint line shows significant narrowing and degenerative meniscal change Medial joint line shows that the meniscus is better preserved Soft tissue swelling with hypoechoic change around the lateral joint line and deep to the patellar tendon and in the  infrapatellar bursa Baker's cyst is noted

## 2024-02-17 NOTE — Assessment & Plan Note (Signed)
 I reassured the patient that she was functioning pretty well and not even using that much pain medicine I suggested that she consider a good course of physical therapy for the next 8 weeks and then see me after that time  I also suggested that for standing walking and being active she use a knee compression sleeve to limit the swelling  Since she has not had problems with using this she should continue her ibuprofen  800 as needed  She should also be consistent with icing her knee in the evening to get rid of some of the swelling  I would like to recheck her in 2 months

## 2024-02-17 NOTE — Patient Instructions (Signed)
 [facility] Located in: [employer] Address: [address] [place], [place] [address] Phone: [phone]

## 2024-03-17 ENCOUNTER — Encounter (HOSPITAL_BASED_OUTPATIENT_CLINIC_OR_DEPARTMENT_OTHER): Payer: Self-pay | Admitting: Obstetrics & Gynecology

## 2024-03-17 ENCOUNTER — Ambulatory Visit (HOSPITAL_BASED_OUTPATIENT_CLINIC_OR_DEPARTMENT_OTHER): Admitting: Obstetrics & Gynecology

## 2024-03-17 VITALS — BP 147/73 | HR 61 | Ht 63.0 in | Wt 159.4 lb

## 2024-03-17 DIAGNOSIS — N3281 Overactive bladder: Secondary | ICD-10-CM

## 2024-03-17 DIAGNOSIS — M8588 Other specified disorders of bone density and structure, other site: Secondary | ICD-10-CM | POA: Diagnosis not present

## 2024-03-17 DIAGNOSIS — Z01411 Encounter for gynecological examination (general) (routine) with abnormal findings: Secondary | ICD-10-CM | POA: Diagnosis not present

## 2024-03-17 DIAGNOSIS — Z01419 Encounter for gynecological examination (general) (routine) without abnormal findings: Secondary | ICD-10-CM

## 2024-03-17 DIAGNOSIS — Z780 Asymptomatic menopausal state: Secondary | ICD-10-CM

## 2024-03-17 DIAGNOSIS — Z90710 Acquired absence of both cervix and uterus: Secondary | ICD-10-CM

## 2024-03-17 MED ORDER — OXYBUTYNIN CHLORIDE ER 5 MG PO TB24
5.0000 mg | ORAL_TABLET | Freq: Every day | ORAL | 3 refills | Status: AC
Start: 2024-03-17 — End: ?

## 2024-03-17 NOTE — Progress Notes (Signed)
 Breast and Pelvic Exam Patient name: AUTUMM HATTERY MRN [id number]  Date of birth: 04-28-1951 Chief Complaint:   Annual Exam (Patient declined Tdap)  History of Present Illness:   Kara Mitchell is a 73 y.o. G16P2003 Caucasian female being seen today for breast and pelvic exam.  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    Denies vaginal bleeding.  Needs RF for oxybutynin .    Diagnosed with bakers cyst in the left knee.  Doing this conservatively.    Patient's last menstrual period was 08/26/2001 (lmp unknown).   Last pap 01/07/2023. Results were: NILM w/ HRHPV not done. H/O abnormal pap: yes Last mammogram: 08/01/2022. Results were: normal. Family h/o breast cancer: no Last colonoscopy: 10/04/2019. Results were: normal. Family h/o colorectal cancer: no DEXA:  -1.4     03/17/2024    9:38 AM 01/07/2023    9:27 AM 12/25/2021    3:38 PM  Depression screen PHQ 2/9  Decreased Interest 0 0 0  Down, Depressed, Hopeless 0 0 0  PHQ - 2 Score 0 0 0    Review of Systems:   Pertinent items are noted in HPI Denies any urinary or bowel changes or pelvic pain Pertinent History Reviewed:  Reviewed past medical,surgical, social and family history.  Reviewed problem list, medications and allergies. Physical Assessment:   Vitals:   03/17/24 0929 03/17/24 0951  BP: (!) 154/71 (!) 147/73  Pulse: (!) 59 61  Weight: 159 lb 6.4 oz (72.3 kg)   Height: 5' 3 (1.6 m)   Body mass index is 28.24 kg/m.        Physical Examination:   General appearance - well appearing, and in no distress  Mental status - alert, oriented to person, place, and time  Psych:  She has a normal mood and affect  Skin - warm and dry, normal color, no suspicious lesions noted  Chest - effort normal, all lung fields clear to auscultation bilaterally  Heart - normal rate and regular rhythm  Neck:  midline trachea, no thyromegaly or nodules  Breasts - breasts  appear normal, no suspicious masses, no skin or nipple changes or  axillary nodes  Abdomen - soft, nontender, nondistended, no masses or organomegaly  Pelvic - VULVA: normal appearing vulva with no masses, tenderness or lesions   VAGINA: normal appearing vagina with normal color and discharge, no lesions   CERVIX: surgically absent  Thin prep pap is not indicated  UTERUS: surgically absent  ADNEXA: No adnexal masses or tenderness noted.  Rectal - normal rectal, good sphincter tone, no masses felt.   Extremities:  No swelling or varicosities noted  Chaperone present for exam  No results found for this or any previous visit (from the past 24 hours).  Assessment & Plan:  1. Encntr for gyn exam (general) (routine) w/o abn findings (Primary) - Pap smear not indicated - Mammogram 08/01/2022 is last report I have.  Release signed for 2024 [facility]. - Colonoscopy 10/04/2019.  Normal. - Bone mineral density 2024 - lab work done with PCP, Dr. Nichole - vaccines reviewed/updated  2. OAB (overactive bladder) - Doing well and RF completed - oxybutynin  (DITROPAN -XL) 5 MG 24 hr tablet; Take 1 tablet (5 mg total) by mouth daily.  Dispense: 90 tablet; Refill: 3  3. Osteopenia of lumbar spine - not interested  4. Postmenopausal - not on HRT  5. History of total vaginal hysterectomy (  TVH)    No orders of the defined types were placed in this encounter.   Meds:  Meds ordered this encounter  Medications   oxybutynin  (DITROPAN -XL) 5 MG 24 hr tablet    Sig: Take 1 tablet (5 mg total) by mouth daily.    Dispense:  90 tablet    Refill:  3    Follow-up: Return for f/u 1-2 years.  Ronal GORMAN Pinal, MD 03/20/2024 11:03 PM

## 2024-03-20 ENCOUNTER — Encounter (HOSPITAL_BASED_OUTPATIENT_CLINIC_OR_DEPARTMENT_OTHER): Payer: Self-pay | Admitting: Obstetrics & Gynecology

## 2024-03-30 ENCOUNTER — Ambulatory Visit: Admitting: Sports Medicine

## 2024-08-10 ENCOUNTER — Telehealth: Payer: Self-pay | Admitting: Family Medicine

## 2024-08-10 NOTE — Telephone Encounter (Signed)
 LVM and sent mychart msg informing pt of need to reschedule 09/13/24 appt - NP out

## 2024-08-17 ENCOUNTER — Encounter (HOSPITAL_BASED_OUTPATIENT_CLINIC_OR_DEPARTMENT_OTHER): Payer: Self-pay | Admitting: Obstetrics & Gynecology

## 2024-09-13 ENCOUNTER — Ambulatory Visit: Admitting: Family Medicine

## 2024-09-14 ENCOUNTER — Other Ambulatory Visit: Payer: Self-pay | Admitting: Endocrinology

## 2024-09-14 DIAGNOSIS — N6019 Diffuse cystic mastopathy of unspecified breast: Secondary | ICD-10-CM

## 2024-10-13 ENCOUNTER — Other Ambulatory Visit

## 2025-03-22 ENCOUNTER — Ambulatory Visit (HOSPITAL_BASED_OUTPATIENT_CLINIC_OR_DEPARTMENT_OTHER): Admitting: Obstetrics & Gynecology
